# Patient Record
Sex: Male | Born: 1970 | Race: Black or African American | Hispanic: No | Marital: Single | Smoking: Current every day smoker
Health system: Southern US, Community
[De-identification: ages and names within clinical notes are randomized; demographics above are authoritative.]

## PROBLEM LIST (undated history)

## (undated) DIAGNOSIS — I63511 Cerebral infarction due to unspecified occlusion or stenosis of right middle cerebral artery: Secondary | ICD-10-CM

## (undated) DIAGNOSIS — M109 Gout, unspecified: Secondary | ICD-10-CM

## (undated) DIAGNOSIS — Z8739 Personal history of other diseases of the musculoskeletal system and connective tissue: Secondary | ICD-10-CM

## (undated) DIAGNOSIS — Z720 Tobacco use: Secondary | ICD-10-CM

## (undated) DIAGNOSIS — I639 Cerebral infarction, unspecified: Secondary | ICD-10-CM

## (undated) DIAGNOSIS — S0292XA Unspecified fracture of facial bones, initial encounter for closed fracture: Secondary | ICD-10-CM

## (undated) DIAGNOSIS — I10 Essential (primary) hypertension: Secondary | ICD-10-CM

## (undated) DIAGNOSIS — M1990 Unspecified osteoarthritis, unspecified site: Secondary | ICD-10-CM

## (undated) DIAGNOSIS — E7800 Pure hypercholesterolemia, unspecified: Secondary | ICD-10-CM

---
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
## 2002-07-30 ENCOUNTER — Encounter: Payer: Self-pay | Admitting: Emergency Medicine

## 2002-07-30 ENCOUNTER — Emergency Department (HOSPITAL_COMMUNITY): Admission: EM | Admit: 2002-07-30 | Discharge: 2002-07-30 | Payer: Self-pay | Admitting: Emergency Medicine

## 2002-10-31 ENCOUNTER — Emergency Department (HOSPITAL_COMMUNITY): Admission: EM | Admit: 2002-10-31 | Discharge: 2002-10-31 | Payer: Self-pay | Admitting: Emergency Medicine

## 2003-07-06 ENCOUNTER — Emergency Department (HOSPITAL_COMMUNITY): Admission: EM | Admit: 2003-07-06 | Discharge: 2003-07-06 | Payer: Self-pay | Admitting: Emergency Medicine

## 2008-03-27 ENCOUNTER — Emergency Department (HOSPITAL_COMMUNITY): Admission: EM | Admit: 2008-03-27 | Discharge: 2008-03-27 | Payer: Self-pay | Admitting: Family Medicine

## 2011-01-20 ENCOUNTER — Emergency Department (HOSPITAL_COMMUNITY): Payer: Self-pay

## 2011-01-20 ENCOUNTER — Emergency Department (HOSPITAL_COMMUNITY)
Admission: EM | Admit: 2011-01-20 | Discharge: 2011-01-20 | Disposition: A | Discharge status: Home / Self Care | Payer: Self-pay | Attending: Emergency Medicine | Admitting: Emergency Medicine

## 2011-01-20 DIAGNOSIS — R51 Headache: Secondary | ICD-10-CM | POA: Insufficient documentation

## 2011-01-20 DIAGNOSIS — S40019A Contusion of unspecified shoulder, initial encounter: Secondary | ICD-10-CM | POA: Insufficient documentation

## 2011-01-20 DIAGNOSIS — IMO0002 Reserved for concepts with insufficient information to code with codable children: Secondary | ICD-10-CM | POA: Insufficient documentation

## 2011-01-20 DIAGNOSIS — S0003XA Contusion of scalp, initial encounter: Secondary | ICD-10-CM | POA: Insufficient documentation

## 2011-01-20 DIAGNOSIS — Y92009 Unspecified place in unspecified non-institutional (private) residence as the place of occurrence of the external cause: Secondary | ICD-10-CM | POA: Insufficient documentation

## 2011-01-20 DIAGNOSIS — R609 Edema, unspecified: Secondary | ICD-10-CM | POA: Insufficient documentation

## 2011-01-20 DIAGNOSIS — S0100XA Unspecified open wound of scalp, initial encounter: Secondary | ICD-10-CM | POA: Insufficient documentation

## 2011-01-31 ENCOUNTER — Inpatient Hospital Stay (INDEPENDENT_AMBULATORY_CARE_PROVIDER_SITE_OTHER)
Admission: RE | Admit: 2011-01-31 | Discharge: 2011-01-31 | Disposition: A | Discharge status: Home / Self Care | Payer: Self-pay | Source: Ambulatory Visit | Attending: Family Medicine | Admitting: Family Medicine

## 2011-01-31 DIAGNOSIS — Z4802 Encounter for removal of sutures: Secondary | ICD-10-CM

## 2011-02-02 ENCOUNTER — Emergency Department (HOSPITAL_COMMUNITY)
Admission: EM | Admit: 2011-02-02 | Discharge: 2011-02-02 | Disposition: A | Discharge status: Home / Self Care | Payer: Self-pay | Attending: Emergency Medicine | Admitting: Emergency Medicine

## 2011-02-02 ENCOUNTER — Emergency Department (HOSPITAL_COMMUNITY): Payer: Self-pay

## 2011-02-02 DIAGNOSIS — M79609 Pain in unspecified limb: Secondary | ICD-10-CM | POA: Insufficient documentation

## 2011-02-02 DIAGNOSIS — Y990 Civilian activity done for income or pay: Secondary | ICD-10-CM | POA: Insufficient documentation

## 2011-02-02 DIAGNOSIS — M25539 Pain in unspecified wrist: Secondary | ICD-10-CM | POA: Insufficient documentation

## 2011-02-02 DIAGNOSIS — S5010XA Contusion of unspecified forearm, initial encounter: Secondary | ICD-10-CM | POA: Insufficient documentation

## 2011-02-02 DIAGNOSIS — W19XXXA Unspecified fall, initial encounter: Secondary | ICD-10-CM | POA: Insufficient documentation

## 2012-06-26 ENCOUNTER — Encounter (HOSPITAL_COMMUNITY): Payer: Self-pay | Admitting: *Deleted

## 2012-06-26 ENCOUNTER — Emergency Department (HOSPITAL_COMMUNITY)
Admission: EM | Admit: 2012-06-26 | Discharge: 2012-06-26 | Disposition: A | Discharge status: Home / Self Care | Payer: Self-pay | Attending: Emergency Medicine | Admitting: Emergency Medicine

## 2012-06-26 ENCOUNTER — Emergency Department (HOSPITAL_COMMUNITY): Payer: Self-pay

## 2012-06-26 DIAGNOSIS — R05 Cough: Secondary | ICD-10-CM | POA: Insufficient documentation

## 2012-06-26 DIAGNOSIS — R509 Fever, unspecified: Secondary | ICD-10-CM | POA: Insufficient documentation

## 2012-06-26 DIAGNOSIS — F17200 Nicotine dependence, unspecified, uncomplicated: Secondary | ICD-10-CM | POA: Insufficient documentation

## 2012-06-26 DIAGNOSIS — R0602 Shortness of breath: Secondary | ICD-10-CM | POA: Insufficient documentation

## 2012-06-26 DIAGNOSIS — R071 Chest pain on breathing: Secondary | ICD-10-CM | POA: Insufficient documentation

## 2012-06-26 DIAGNOSIS — J40 Bronchitis, not specified as acute or chronic: Secondary | ICD-10-CM | POA: Insufficient documentation

## 2012-06-26 DIAGNOSIS — R059 Cough, unspecified: Secondary | ICD-10-CM | POA: Insufficient documentation

## 2012-06-26 DIAGNOSIS — R0789 Other chest pain: Secondary | ICD-10-CM

## 2012-06-26 LAB — D-DIMER, QUANTITATIVE: D-Dimer, Quant: <0.27 ug/mL-FEU (ref 0.00–0.48)

## 2012-06-26 LAB — COMPREHENSIVE METABOLIC PANEL
ALT: 23 U/L (ref 0–53)
AST: 22 U/L (ref 0–37)
Albumin: 3.8 g/dL (ref 3.5–5.2)
Alkaline Phosphatase: 70 U/L (ref 39–117)
BUN: 9 mg/dL (ref 6–23)
CO2: 22 mEq/L (ref 19–32)
Calcium: 9.6 mg/dL (ref 8.4–10.5)
Chloride: 102 mEq/L (ref 96–112)
Creatinine, Ser: 0.94 mg/dL (ref 0.50–1.35)
GFR calc Af Amer: >90 mL/min (ref >90)
GFR calc non Af Amer: >90 mL/min (ref >90)
Glucose, Bld: 104 mg/dL — ABNORMAL HIGH (ref 70–99)
Potassium: 3.9 mEq/L (ref 3.5–5.1)
Sodium: 135 mEq/L (ref 135–145)
Total Bilirubin: 0.2 mg/dL — ABNORMAL LOW (ref 0.3–1.2)
Total Protein: 7.5 g/dL (ref 6.0–8.3)

## 2012-06-26 LAB — CBC WITH DIFFERENTIAL/PLATELET
Basophils Absolute: 0 10*3/uL (ref 0.0–0.1)
Basophils Relative: 0 % (ref 0–1)
Eosinophils Absolute: 0.1 10*3/uL (ref 0.0–0.7)
Eosinophils Relative: 2 % (ref 0–5)
HCT: 39.2 % (ref 39.0–52.0)
Hemoglobin: 14.2 g/dL (ref 13.0–17.0)
Lymphocytes Relative: 37 % (ref 12–46)
Lymphs Abs: 2 10*3/uL (ref 0.7–4.0)
MCH: 32.6 pg (ref 26.0–34.0)
MCHC: 36.2 g/dL — ABNORMAL HIGH (ref 30.0–36.0)
MCV: 89.9 fL (ref 78.0–100.0)
Monocytes Absolute: 0.4 10*3/uL (ref 0.1–1.0)
Monocytes Relative: 7 % (ref 3–12)
Neutro Abs: 3 10*3/uL (ref 1.7–7.7)
Neutrophils Relative %: 54 % (ref 43–77)
Platelets: 259 10*3/uL (ref 150–400)
RBC: 4.36 MIL/uL (ref 4.22–5.81)
RDW: 13.6 % (ref 11.5–15.5)
WBC: 5.5 10*3/uL (ref 4.0–10.5)

## 2012-06-26 LAB — POCT I-STAT TROPONIN I: Troponin i, poc: 0 ng/mL (ref 0.00–0.08)

## 2012-06-26 MED ORDER — IBUPROFEN 600 MG PO TABS: 600.0000 mg | ORAL_TABLET | Freq: Three times a day (TID) | ORAL | Status: DC

## 2012-06-26 MED ORDER — TRAMADOL HCL 50 MG PO TABS: 50.0000 mg | ORAL_TABLET | Freq: Four times a day (QID) | ORAL | Status: DC | PRN

## 2012-06-26 MED ORDER — AZITHROMYCIN 250 MG PO TABS: 250.0000 mg | ORAL_TABLET | Freq: Every day | ORAL | Status: DC

## 2012-06-26 MED ORDER — SODIUM CHLORIDE 0.9 % IV BOLUS (SEPSIS)
1000.0000 mL | Freq: Once | INTRAVENOUS | Status: AC
  Administered 2012-06-26: 1000 mL via INTRAVENOUS

## 2012-06-26 MED ORDER — BENZONATATE 100 MG PO CAPS: 100.0000 mg | ORAL_CAPSULE | Freq: Three times a day (TID) | ORAL | Status: DC

## 2012-06-26 MED ORDER — KETOROLAC TROMETHAMINE 30 MG/ML IJ SOLN
30.0000 mg | Freq: Once | INTRAMUSCULAR | Status: AC
  Administered 2012-06-26: 30 mg via INTRAVENOUS
  Filled 2012-06-26: qty 1

## 2012-06-26 NOTE — ED Notes (Signed)
C/o constant non-stop right side CP non radiating with occasional SOB usually with coughing spells x 1 month. Increased pain with palpation, deep breaths, certain mvmts & coughing. Denies diaphoresis, n/v.  C/o prod cough x 2 months, reports had a fever 2 weeks ago, none since then.  Presently resp e/u & shallow, denies SOB.

## 2012-06-26 NOTE — ED Notes (Signed)
Patient transported to X-ray 

## 2012-06-26 NOTE — ED Notes (Signed)
C/o constant right side CP x 1 month. +SOB, denies n/v, diaphoresis. Productive cough x 2 months

## 2012-06-26 NOTE — ED Provider Notes (Signed)
History     CSN: [id number]  Arrival date & time 06/26/12  1004   First MD Initiated Contact with Patient 06/26/12 1007      Chief Complaint  Patient presents with  . Chest Pain    (Consider location/radiation/quality/duration/timing/severity/associated sxs/prior treatment) HPI Pt p/w R anterior chest pain x 1 month. Pain is constant though worse with deep inspiration and cough. Has had productive cough for the past month or 2 with episodic subjective fevers. No recent travel or surgeries. + 20 year smoking history.   History reviewed. No pertinent past medical history.  History reviewed. No pertinent past surgical history.  History reviewed. No pertinent family history.  History  Substance Use Topics  . Smoking status: Current Every Day Smoker -- 2.0 packs/day for 27 years    Types: Cigarettes  . Smokeless tobacco: Not on file  . Alcohol Use: Yes     Comment: 128 oz beer daily      Review of Systems  Constitutional: Positive for fever. Negative for chills.  HENT: Negative for neck pain.   Respiratory: Positive for cough and shortness of breath. Negative for chest tightness and wheezing.   Cardiovascular: Positive for chest pain. Negative for palpitations and leg swelling.  Gastrointestinal: Negative for nausea, vomiting, abdominal pain and diarrhea.  Musculoskeletal: Negative for myalgias, back pain and joint swelling.  Skin: Negative for rash and wound.  Neurological: Negative for dizziness, weakness, light-headedness, numbness and headaches.    Allergies  Review of patient's allergies indicates no known allergies.  Home Medications   Current Outpatient Rx  Name  Route  Sig  Dispense  Refill  . AZITHROMYCIN 250 MG PO TABS   Oral   Take 1 tablet (250 mg total) by mouth daily. Take first 2 tablets together, then 1 every day until finished.   6 tablet   0   . BENZONATATE 100 MG PO CAPS   Oral   Take 1 capsule (100 mg total) by mouth every 8 (eight) hours.  21 capsule   0   . IBUPROFEN 600 MG PO TABS   Oral   Take 1 tablet (600 mg total) by mouth 3 (three) times daily.   30 tablet   0   . TRAMADOL HCL 50 MG PO TABS   Oral   Take 1 tablet (50 mg total) by mouth every 6 (six) hours as needed for pain.   15 tablet   0     BP 138/91  Pulse 79  Temp 98.1 F (36.7 C) (Oral)  Resp 14  Ht 5\' 8"  (1.727 m)  Wt 185 lb (83.915 kg)  BMI 28.13 kg/m2  SpO2 99%  Physical Exam  Nursing note and vitals reviewed. Constitutional: He is oriented to person, place, and time. He appears well-developed and well-nourished. No distress.  HENT:  Head: Normocephalic and atraumatic.  Mouth/Throat: Oropharynx is clear and moist.  Eyes: EOM are normal. Pupils are equal, round, and reactive to light.  Neck: Normal range of motion. Neck supple.  Cardiovascular: Normal rate and regular rhythm.  Exam reveals no gallop and no friction rub.   No murmur heard. Pulmonary/Chest: Effort normal and breath sounds normal. No respiratory distress. He has no wheezes. He has no rales. He exhibits tenderness (Reproduced R chest wall TTP. ).  Abdominal: Soft. Bowel sounds are normal. He exhibits no distension and no mass. There is no tenderness. There is no rebound and no guarding.  Musculoskeletal: Normal range of motion. He exhibits no edema  and no tenderness.       No calf swelling or pain  Neurological: He is alert and oriented to person, place, and time.  Skin: Skin is warm and dry. No rash noted. No erythema.  Psychiatric: He has a normal mood and affect. His behavior is normal.    ED Course  Procedures (including critical care time)  Labs Reviewed  CBC WITH DIFFERENTIAL - Abnormal; Notable for the following:    MCHC 36.2 (*)     All other components within normal limits  COMPREHENSIVE METABOLIC PANEL - Abnormal; Notable for the following:    Glucose, Bld 104 (*)     Total Bilirubin 0.2 (*)     All other components within normal limits  D-DIMER,  QUANTITATIVE  POCT I-STAT TROPONIN I   Dg Chest 2 View  06/26/2012  *RADIOLOGY REPORT*  Clinical Data: Chest pain and cough.  Shortness of breath.  Smoker.  CHEST - 2 VIEW  Comparison: None.  Findings: Midline trachea.  Normal heart size and mediastinal contours. No pleural effusion or pneumothorax.  Clear lungs.  IMPRESSION: No acute cardiopulmonary disease.   Original Report Authenticated By: Jeronimo Greaves, M.D.      1. Chest wall pain   2. Bronchitis       Date: 06/26/2012  Rate: 92  Rhythm: normal sinus rhythm  QRS Axis: normal  Intervals: normal  ST/T Wave abnormalities: nonspecific T wave changes  Conduction Disutrbances:none  Narrative Interpretation:   Old EKG Reviewed: none available   MDM  Pt feeling better after meds. Pain is likely pleurisy or musculoskeletal pain due to bronchitis. Encouraged to return for worsening pain or concerns        Loren Racer, MD 06/26/12 1245

## 2012-09-23 DIAGNOSIS — I639 Cerebral infarction, unspecified: Secondary | ICD-10-CM

## 2012-09-23 HISTORY — DX: Cerebral infarction, unspecified: I63.9

## 2012-10-05 ENCOUNTER — Emergency Department (HOSPITAL_COMMUNITY): Payer: Self-pay

## 2012-10-05 ENCOUNTER — Inpatient Hospital Stay (HOSPITAL_COMMUNITY): Payer: Self-pay

## 2012-10-05 ENCOUNTER — Encounter (HOSPITAL_COMMUNITY): Payer: Self-pay | Admitting: Emergency Medicine

## 2012-10-05 ENCOUNTER — Inpatient Hospital Stay (HOSPITAL_COMMUNITY)
Admission: EM | Admit: 2012-10-05 | Discharge: 2012-10-09 | DRG: 066 | Disposition: A | Discharge status: Home Health | Payer: MEDICAID | Attending: Internal Medicine | Admitting: Internal Medicine

## 2012-10-05 DIAGNOSIS — F1410 Cocaine abuse, uncomplicated: Secondary | ICD-10-CM | POA: Diagnosis present

## 2012-10-05 DIAGNOSIS — I6350 Cerebral infarction due to unspecified occlusion or stenosis of unspecified cerebral artery: Principal | ICD-10-CM | POA: Diagnosis present

## 2012-10-05 DIAGNOSIS — R531 Weakness: Secondary | ICD-10-CM | POA: Diagnosis present

## 2012-10-05 DIAGNOSIS — F1491 Cocaine use, unspecified, in remission: Secondary | ICD-10-CM | POA: Diagnosis present

## 2012-10-05 DIAGNOSIS — F1210 Cannabis abuse, uncomplicated: Secondary | ICD-10-CM | POA: Diagnosis present

## 2012-10-05 DIAGNOSIS — F17200 Nicotine dependence, unspecified, uncomplicated: Secondary | ICD-10-CM | POA: Diagnosis present

## 2012-10-05 DIAGNOSIS — Z87898 Personal history of other specified conditions: Secondary | ICD-10-CM | POA: Diagnosis present

## 2012-10-05 DIAGNOSIS — E785 Hyperlipidemia, unspecified: Secondary | ICD-10-CM

## 2012-10-05 DIAGNOSIS — F1490 Cocaine use, unspecified, uncomplicated: Secondary | ICD-10-CM

## 2012-10-05 DIAGNOSIS — Z720 Tobacco use: Secondary | ICD-10-CM

## 2012-10-05 DIAGNOSIS — I639 Cerebral infarction, unspecified: Secondary | ICD-10-CM

## 2012-10-05 LAB — COMPREHENSIVE METABOLIC PANEL
ALT: 17 U/L (ref 0–53)
AST: 21 U/L (ref 0–37)
Albumin: 4.2 g/dL (ref 3.5–5.2)
Alkaline Phosphatase: 59 U/L (ref 39–117)
BUN: 14 mg/dL (ref 6–23)
CO2: 21 mEq/L (ref 19–32)
Calcium: 10.2 mg/dL (ref 8.4–10.5)
Chloride: 101 mEq/L (ref 96–112)
Creatinine, Ser: 1.12 mg/dL (ref 0.50–1.35)
GFR calc Af Amer: >90 mL/min (ref >90)
GFR calc non Af Amer: 79 mL/min — ABNORMAL LOW (ref >90)
Glucose, Bld: 116 mg/dL — ABNORMAL HIGH (ref 70–99)
Potassium: 4 mEq/L (ref 3.5–5.1)
Sodium: 137 mEq/L (ref 135–145)
Total Bilirubin: 0.2 mg/dL — ABNORMAL LOW (ref 0.3–1.2)
Total Protein: 8.6 g/dL — ABNORMAL HIGH (ref 6.0–8.3)

## 2012-10-05 LAB — RAPID URINE DRUG SCREEN, HOSP PERFORMED
Amphetamines: NOT DETECTED
Barbiturates: NOT DETECTED
Benzodiazepines: NOT DETECTED
Cocaine: POSITIVE — AB
Opiates: NOT DETECTED
Tetrahydrocannabinol: POSITIVE — AB

## 2012-10-05 LAB — CBC WITH DIFFERENTIAL/PLATELET
Basophils Absolute: 0 10*3/uL (ref 0.0–0.1)
Basophils Relative: 0 % (ref 0–1)
Eosinophils Absolute: 0.1 10*3/uL (ref 0.0–0.7)
Eosinophils Relative: 2 % (ref 0–5)
HCT: 43.2 % (ref 39.0–52.0)
Hemoglobin: 16 g/dL (ref 13.0–17.0)
Lymphocytes Relative: 40 % (ref 12–46)
Lymphs Abs: 2.3 10*3/uL (ref 0.7–4.0)
MCH: 32.7 pg (ref 26.0–34.0)
MCHC: 37 g/dL — ABNORMAL HIGH (ref 30.0–36.0)
MCV: 88.2 fL (ref 78.0–100.0)
Monocytes Absolute: 0.4 10*3/uL (ref 0.1–1.0)
Monocytes Relative: 6 % (ref 3–12)
Neutro Abs: 3 10*3/uL (ref 1.7–7.7)
Neutrophils Relative %: 51 % (ref 43–77)
Platelets: 277 10*3/uL (ref 150–400)
RBC: 4.9 MIL/uL (ref 4.22–5.81)
RDW: 13 % (ref 11.5–15.5)
WBC: 5.8 10*3/uL (ref 4.0–10.5)

## 2012-10-05 LAB — ETHANOL: Alcohol, Ethyl (B): <11 mg/dL (ref 0–11)

## 2012-10-05 MED ORDER — SODIUM CHLORIDE 0.9 % IV BOLUS (SEPSIS)
1000.0000 mL | Freq: Once | INTRAVENOUS | Status: AC
  Administered 2012-10-05: 1000 mL via INTRAVENOUS

## 2012-10-05 MED ORDER — ASPIRIN 81 MG PO CHEW
324.0000 mg | CHEWABLE_TABLET | Freq: Every day | ORAL | Status: DC
  Administered 2012-10-05 – 2012-10-09 (×5): 324 mg via ORAL
  Filled 2012-10-05 (×5): qty 4

## 2012-10-05 NOTE — ED Notes (Signed)
approx 2 days friend has noticed diffrence in pt and weakness not acting normal and possible stroke pt does have slurred speech, unsteady gait, sx began 2 days ago,

## 2012-10-05 NOTE — H&P (Addendum)
[facility] History and Physical  Manuel Harrison:[id number] DOB: [birth year] DOA: 10/05/2012  Referring physician: ER physician PCP: No primary provider on file.   Chief Complaint: right sided weakness  HPI:  42 year old male with no significant past medical history who has presented with right sided weakness involving upper and lower extremity associated with right sided facial droop and slurred speech for about 1 week prior to this admission. Patient reported right foot drop and difficulty ambulating. He is somewhat confused, he knows his name but does not know he is in hospital or the date  at the time of my interview with him. No reports of chest pain, shortness of breath, palpitations. No abdominal pain, nausea or vomiting. No lightheadedness or loss of consciousness. No fever or chills. No reports of blood in stool or urine.  In ED, evaluation included CT head which was significant for acute infarct in left internal capsule and deep white matter. CBC and BMET was essentially unremarkable.  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   Stroke order set in place. Follow up 2 D ECHO, carotid doppler. Lipid panel.  Follow up PT/OT and SLP evaluation. If SLP normal then may start aspirin.  UDS positive for cocaine Active Problems:   Stroke  Management as above  Code Status: Full Family Communication: Pt at bedside Disposition Plan: Admit for further evaluation  Manson Passey, MD  [facility] Pager [phone]  If 7PM-7AM, please contact night-coverage [email or web address] Password [email or web address] 10/05/2012, 7:01 PM  Review of Systems:  Constitutional: Negative for fever, chills and malaise/fatigue. Negative for diaphoresis.  HENT: Negative for hearing loss, ear pain, nosebleeds, congestion, sore throat, neck pain, tinnitus and ear discharge.   Eyes:  Negative for blurred vision, double vision, photophobia, pain, discharge and redness.  Respiratory: Negative for cough, hemoptysis, sputum production, shortness of breath, wheezing and stridor.   Cardiovascular: Negative for chest pain, palpitations, orthopnea, claudication and leg swelling.  Gastrointestinal: Negative for nausea, vomiting and abdominal pain. Negative for heartburn, constipation, blood in stool and melena.  Genitourinary: Negative for dysuria, urgency, frequency, hematuria and flank pain.  Musculoskeletal: Negative for myalgias, back pain, joint pain and falls.  Skin: Negative for itching and rash.  Neurological: positive for right sided weakness; right facial droop Endo/Heme/Allergies: Negative for environmental allergies and polydipsia. Does not bruise/bleed easily.  Psychiatric/Behavioral: Negative for suicidal ideas. The patient is not nervous/anxious.      History reviewed. No pertinent past medical history. No past surgical history on file. Social History:  reports that he has been smoking Cigarettes.  He has a 54 pack-year smoking history. He does not have any smokeless tobacco history on file. He reports that  drinks alcohol. He reports that he uses illicit drugs (Marijuana).  No Known Allergies  Family History: htn in mother   Prior to Admission medications   Not on File   Physical Exam: Filed Vitals:   10/05/12 1732  BP: 140/93  Pulse: 88  Temp: 98.3 F (36.8 C)  TempSrc: Oral  SpO2: 100%    Physical Exam  Constitutional: Appears well-developed and well-nourished. No distress.  HENT: Normocephalic. External right and left ear normal. Oropharynx is clear and moist.  Eyes: Conjunctivae and EOM are normal. PERRLA, no scleral icterus.  Neck: Normal ROM. Neck supple. No JVD. No tracheal deviation. No thyromegaly.  CVS: RRR, S1/S2 +, no  murmurs, no gallops, no carotid bruit.  Pulmonary: Effort and breath sounds normal, no stridor, rhonchi, wheezes, rales.   Abdominal: Soft. BS +,  no distension, tenderness, rebound or guarding.  Musculoskeletal: Normal range of motion. No edema and no tenderness.  Lymphadenopathy: No lymphadenopathy noted, cervical, inguinal. Neuro: Alert but oriented to person (self) only. Normal reflexes, right sided weakness; right facial droop Skin: Skin is warm and dry. No rash noted. Not diaphoretic. No erythema. No pallor.  Psychiatric: Normal mood and affect. Behavior, judgment, thought content normal.   Labs on Admission:  Basic Metabolic Panel: No results found for this basename: NA, K, CL, CO2, GLUCOSE, BUN, CREATININE, CALCIUM, MG, PHOS,  in the last 168 hours Liver Function Tests: No results found for this basename: AST, ALT, ALKPHOS, BILITOT, PROT, ALBUMIN,  in the last 168 hours No results found for this basename: LIPASE, AMYLASE,  in the last 168 hours No results found for this basename: AMMONIA,  in the last 168 hours CBC:  Recent Labs Lab 10/05/12 1819  WBC 5.8  NEUTROABS 3.0  HGB 16.0  HCT 43.2  MCV 88.2  PLT 277   Cardiac Enzymes: No results found for this basename: CKTOTAL, CKMB, CKMBINDEX, TROPONINI,  in the last 168 hours BNP: No components found with this basename: POCBNP,  CBG: No results found for this basename: GLUCAP,  in the last 168 hours  Radiological Exams on Admission: Ct Head Wo Contrast 10/05/2012  * IMPRESSION: Hypodensity left internal capsule and deep white matter on the left, consistent with acute infarct.  MRI and MRA of the head is suggested for further evaluation.   Original Report Authenticated By: Janeece Riggers, M.D.     EKG: Normal sinus rhythm, no ST/T wave changes  Time spent: 75 minutes

## 2012-10-05 NOTE — ED Provider Notes (Addendum)
History     CSN: [id number]  Arrival date & time 10/05/12  1717   First MD Initiated Contact with Patient 10/05/12 1736    poor history due to speech difficulty  Chief Complaint  Patient presents with  . Aphasia  . Weakness  . Gait Problem  . Facial Droop    (Consider location/radiation/quality/duration/timing/severity/associated sxs/prior treatment) HPI Patient with onset of weakness on right side and difficulty speaking for a week. History is obtained from his girlfriend who states that he had refused to come to the hospital until today. He has been walking with difficulty. He has difficulty using his right hand. She states that the onset was fairly sudden but is unable to pinpoint for me and time when it occurred. He has no previous history of this. He does smoke does not have any other known medical history. History reviewed. No pertinent past medical history.  No past surgical history on file.  No family history on file.  History  Substance Use Topics  . Smoking status: Current Every Day Smoker -- 2.00 packs/day for 27 years    Types: Cigarettes  . Smokeless tobacco: Not on file  . Alcohol Use: Yes     Comment: 128 oz beer daily      Review of Systems  Unable to perform ROS   Allergies  Review of patient's allergies indicates no known allergies.  Home Medications  No current outpatient prescriptions on file.  BP 140/93  Pulse 88  Temp(Src) 98.3 F (36.8 C) (Oral)  SpO2 100%  Physical Exam  Nursing note and vitals reviewed. Constitutional: He appears well-developed and well-nourished.  HENT:  Head: Normocephalic and atraumatic.  Right Ear: External ear normal.  Left Ear: External ear normal.  Mouth/Throat: Oropharynx is clear and moist.  Eyes: Conjunctivae and EOM are normal. Pupils are equal, round, and reactive to light.  Neck: Normal range of motion. Neck supple.  Cardiovascular: Normal rate, regular rhythm, normal heart sounds and intact distal  pulses.   Pulmonary/Chest: Effort normal and breath sounds normal.  Abdominal: Soft. Bowel sounds are normal.  Neurological:  Rue strenght 4/5, rle 4/5 lue 5/5, lle 5/5 Right facial decreased movment Speech garbled    ED Course  Procedures (including critical care time)  Labs Reviewed  CBC WITH DIFFERENTIAL  COMPREHENSIVE METABOLIC PANEL  ETHANOL  URINE RAPID DRUG SCREEN (HOSP PERFORMED)   No results found.   No diagnosis found.  Results for orders placed during the hospital encounter of 06/26/12  CBC WITH DIFFERENTIAL      Result Value Range   WBC 5.5  4.0 - 10.5 K/uL   RBC 4.36  4.22 - 5.81 MIL/uL   Hemoglobin 14.2  13.0 - 17.0 g/dL   HCT 40.9  81.1 - 91.4 %   MCV 89.9  78.0 - 100.0 fL   MCH 32.6  26.0 - 34.0 pg   MCHC 36.2 (*) 30.0 - 36.0 g/dL   RDW 78.2  95.6 - 21.3 %   Platelets 259  150 - 400 K/uL   Neutrophils Relative 54  43 - 77 %   Neutro Abs 3.0  1.7 - 7.7 K/uL   Lymphocytes Relative 37  12 - 46 %   Lymphs Abs 2.0  0.7 - 4.0 K/uL   Monocytes Relative 7  3 - 12 %   Monocytes Absolute 0.4  0.1 - 1.0 K/uL   Eosinophils Relative 2  0 - 5 %   Eosinophils Absolute 0.1  0.0 - 0.7  K/uL   Basophils Relative 0  0 - 1 %   Basophils Absolute 0.0  0.0 - 0.1 K/uL  COMPREHENSIVE METABOLIC PANEL      Result Value Range   Sodium 135  135 - 145 mEq/L   Potassium 3.9  3.5 - 5.1 mEq/L   Chloride 102  96 - 112 mEq/L   CO2 22  19 - 32 mEq/L   Glucose, Bld 104 (*) 70 - 99 mg/dL   BUN 9  6 - 23 mg/dL   Creatinine, Ser 1.61  0.50 - 1.35 mg/dL   Calcium 9.6  8.4 - 09.6 mg/dL   Total Protein 7.5  6.0 - 8.3 g/dL   Albumin 3.8  3.5 - 5.2 g/dL   AST 22  0 - 37 U/L   ALT 23  0 - 53 U/L   Alkaline Phosphatase 70  39 - 117 U/L   Total Bilirubin 0.2 (*) 0.3 - 1.2 mg/dL   GFR calc non Af Amer >90  >90 mL/min   GFR calc Af Amer >90  >90 mL/min  D-DIMER, QUANTITATIVE      Result Value Range   D-Dimer, Quant <0.27  0.00 - 0.48 ug/mL-FEU  POCT I-STAT TROPONIN I      Result  Value Range   Troponin i, poc 0.00  0.00 - 0.08 ng/mL   Comment 3             Ct Head Wo Contrast  10/05/2012  *RADIOLOGY REPORT*  Clinical Data: Ataxia,  aphasia, weakness  CT HEAD WITHOUT CONTRAST  Technique:  Contiguous axial images were obtained from the base of the skull through the vertex without contrast.  Comparison: T 01/20/2011  Findings: Ill-defined hypodensity in the left internal capsule and deep white matter, not present on the prior CT.  This is most likely due to acute infarct.  Negative for intracranial hemorrhage.  No mass lesion is present. No midline shift.  Ventricle size is normal.  IMPRESSION: Hypodensity left internal capsule and deep white matter on the left, consistent with acute infarct.  MRI and MRA of the head is suggested for further evaluation.   Original Report Authenticated By: Janeece Riggers, M.D.      Date: 10/05/2012  Rate: 79  Rhythm: normal sinus rhythm with sinus arrhythmia  QRS Axis: normal  Intervals: normal  ST/T Wave abnormalities: nonspecific st changes in inferior leads  Conduction Disutrbances: none  Narrative Interpretation: unremarkable  Compared to ekg of 06/26/12 the rate has decreased and nsst changes are unchanged   MDM  42 year old male with no known medical history who presents today with one-week history of right sided weakness and difficulty speaking. CT shows a hypodensity of the left internal capsule consistent with acute infarct. Patient is not a candidate for TPA do to duration of the symptoms. He is having difficulty at home performing activities of daily living. Hospitalists are being consulted to admit the patient for further workup and physical therapy and neurology consult.     The patient's care discussed with Dr. Elisabeth Pigeon and patient is to be admitted to a telemetry team here at [facility] long on team heat.  Hilario Quarry, MD 10/05/12 0454    Hilario Quarry, MD 10/10/12 [phone]

## 2012-10-06 ENCOUNTER — Inpatient Hospital Stay (HOSPITAL_COMMUNITY): Payer: Self-pay

## 2012-10-06 ENCOUNTER — Encounter (HOSPITAL_COMMUNITY): Payer: Self-pay | Admitting: *Deleted

## 2012-10-06 DIAGNOSIS — I517 Cardiomegaly: Secondary | ICD-10-CM

## 2012-10-06 DIAGNOSIS — Z720 Tobacco use: Secondary | ICD-10-CM | POA: Diagnosis present

## 2012-10-06 DIAGNOSIS — F1491 Cocaine use, unspecified, in remission: Secondary | ICD-10-CM | POA: Diagnosis present

## 2012-10-06 DIAGNOSIS — I6350 Cerebral infarction due to unspecified occlusion or stenosis of unspecified cerebral artery: Principal | ICD-10-CM

## 2012-10-06 DIAGNOSIS — Z87898 Personal history of other specified conditions: Secondary | ICD-10-CM | POA: Diagnosis present

## 2012-10-06 DIAGNOSIS — M6281 Muscle weakness (generalized): Secondary | ICD-10-CM

## 2012-10-06 DIAGNOSIS — E785 Hyperlipidemia, unspecified: Secondary | ICD-10-CM | POA: Diagnosis present

## 2012-10-06 DIAGNOSIS — F17200 Nicotine dependence, unspecified, uncomplicated: Secondary | ICD-10-CM

## 2012-10-06 LAB — LIPID PANEL
Cholesterol: 200 mg/dL (ref 0–200)
HDL: 27 mg/dL — ABNORMAL LOW (ref >39)
LDL Cholesterol: 128 mg/dL — ABNORMAL HIGH (ref 0–99)
Total CHOL/HDL Ratio: 7.4 RATIO
Triglycerides: 227 mg/dL — ABNORMAL HIGH (ref <150)
VLDL: 45 mg/dL — ABNORMAL HIGH (ref 0–40)

## 2012-10-06 LAB — HEMOGLOBIN A1C
Hgb A1c MFr Bld: 5.5 % (ref <5.7)
Mean Plasma Glucose: 111 mg/dL (ref <117)

## 2012-10-06 LAB — ANTITHROMBIN III: AntiThromb III Func: 110 % (ref 75–120)

## 2012-10-06 MED ORDER — SIMVASTATIN 20 MG PO TABS
20.0000 mg | ORAL_TABLET | Freq: Every day | ORAL | Status: DC
  Administered 2012-10-06 – 2012-10-08 (×3): 20 mg via ORAL
  Filled 2012-10-06 (×4): qty 1

## 2012-10-06 MED ORDER — INFLUENZA VIRUS VACC SPLIT PF IM SUSP
0.5000 mL | INTRAMUSCULAR | Status: AC
  Administered 2012-10-06: 0.5 mL via INTRAMUSCULAR
  Filled 2012-10-06: qty 0.5

## 2012-10-06 MED ORDER — ENOXAPARIN SODIUM 40 MG/0.4ML ~~LOC~~ SOLN
40.0000 mg | SUBCUTANEOUS | Status: DC
  Administered 2012-10-06 – 2012-10-09 (×4): 40 mg via SUBCUTANEOUS
  Filled 2012-10-06 (×4): qty 0.4

## 2012-10-06 MED ORDER — PNEUMOCOCCAL VAC POLYVALENT 25 MCG/0.5ML IJ INJ
0.5000 mL | INJECTION | INTRAMUSCULAR | Status: AC
  Filled 2012-10-06 (×2): qty 0.5

## 2012-10-06 MED ORDER — NICOTINE 21 MG/24HR TD PT24
21.0000 mg | MEDICATED_PATCH | Freq: Every day | TRANSDERMAL | Status: DC
  Administered 2012-10-06 – 2012-10-09 (×4): 21 mg via TRANSDERMAL
  Filled 2012-10-06 (×4): qty 1

## 2012-10-06 NOTE — Progress Notes (Signed)
VASCULAR LAB PRELIMINARY  PRELIMINARY  PRELIMINARY  PRELIMINARY  Carotid duplex  completed.    Preliminary report:  Bilateral:  No evidence of hemodynamically significant internal carotid artery stenosis.   Vertebral artery flow is antegrade.      Remmy Riffe, RVT 10/06/2012, 9:30 AM

## 2012-10-06 NOTE — Progress Notes (Signed)
Rehab Admissions Coordinator Note:  Patient was screened by Manuel Harrison for appropriateness for an Inpatient Acute Rehab Consult.  At this time, we are recommending PT eval and further progress with therapy before determining if inpt rehab needed or home health most appropriate. I will follow up on Monday.  Manuel Harrison 10/06/2012, 4:17 PM  I can be reached at [phone].

## 2012-10-06 NOTE — Evaluation (Signed)
Occupational Therapy Evaluation Patient Details Name: Manuel Harrison MRN: [id number] DOB: 05/25/1971 Today's Date: 10/06/2012 Time: 5621-3086 OT Time Calculation (min): 19 min  OT Assessment / Plan / Recommendation Clinical Impression  This 42 year old man was admitted with L internal capsule CVA.  He is appropriate for skilled OT to increase safety and independence with adls and to strengthen RUE to use as dominant hand.    OT Assessment  Patient needs continued OT Services    Follow Up Recommendations  CIR vs. Home with 24/7    Barriers to Discharge      Equipment Recommendations   (to be further assessed)    Recommendations for Other Services    Frequency  Min 3X/week    Precautions / Restrictions Precautions Precautions: Fall Restrictions Weight Bearing Restrictions: No   Pertinent Vitals/Pain No pain    ADL  Grooming: Set up;Wash/dry hands;Wash/dry face Where Assessed - Grooming: Supported sitting Upper Body Bathing: Set up Where Assessed - Upper Body Bathing: Supported sitting Lower Body Bathing: Minimal assistance Where Assessed - Lower Body Bathing: Supported sit to stand Upper Body Dressing: Set up Where Assessed - Upper Body Dressing: Unsupported sitting Lower Body Dressing: Minimal assistance Where Assessed - Lower Body Dressing: Supported sit to Pharmacist, hospital: Minimal assistance Statistician Method: Sit to Barista:  (bed, ambulated, bed) Toileting - Clothing Manipulation and Hygiene: Minimal assistance Where Assessed - Glass blower/designer Manipulation and Hygiene: Sit to stand from 3-in-1 or toilet Equipment Used: Gait belt;Rolling walker Transfers/Ambulation Related to ADLs: min A ambulating with RW.  Pt initially ran into things on R (twice) but then self corrected. Pt with weakness in RLE --crossed leg towards midline and slight limp noted  ADL Comments: Able to use bil UEs, Right is weaker, and he used L as primary  opening containers    OT Diagnosis: Hemiplegia dominant side  OT Problem List: Decreased strength;Decreased activity tolerance;Impaired balance (sitting and/or standing) OT Treatment Interventions: Self-care/ADL training;Balance training;Patient/family education;Therapeutic activities;Therapeutic exercise;DME and/or AE instruction   OT Goals Acute Rehab OT Goals OT Goal Formulation: With patient Time For Goal Achievement: 10/20/12 Potential to Achieve Goals: Good ADL Goals Pt Will Transfer to Toilet: with supervision;Ambulation;Regular height toilet (vs: 3:1 over commode) ADL Goal: Toilet Transfer - Progress: Goal set today Miscellaneous OT Goals Miscellaneous OT Goal #1: Pt will gather clothes with RW and min guard then perform adl with supervision, sit to stand OT Goal: Miscellaneous Goal #1 - Progress: Goal set today Miscellaneous OT Goal #2: Pt will be independent with HEP (AROM) for R UE strengthening and coordination (using RUE as dominant hand after education) OT Goal: Miscellaneous Goal #2 - Progress: Goal set today Miscellaneous OT Goal #3: Pt will transfer to tub or shower with min A and appropriate DME OT Goal: Miscellaneous Goal #3 - Progress: Goal set today  Visit Information  Last OT Received On: 10/06/12 Assistance Needed: +1 PT/OT Co-Evaluation/Treatment: Yes    Subjective Data  Subjective: Yes (asked if someone could stay with him all the time at home, if needed) Patient Stated Goal: Agreeable to OT/PT.  None stated   Prior Functioning     Home Living Lives With: Alone (per chart) Prior Function Level of Independence: Independent Communication Communication:  (pt has very short answers--uh huh/ uh used a lot  ? aphasic ) Dominant Hand: Right         Vision/Perception Vision - History Patient Visual Report: No change from baseline (tracking; confrontation wfls)  Cognition  Cognition Overall Cognitive Status: Appears within functional limits for  tasks assessed/performed Arousal/Alertness: Awake/alert Orientation Level: Appears intact for tasks assessed (knew hospital but not [facility]) Behavior During Session: [facility] for tasks performed    Extremity/Trunk Assessment Right Upper Extremity Assessment RUE ROM/Strength/Tone: Deficits RUE ROM/Strength/Tone Deficits: has isolated movement but shoulder 3-/5 strength.  Able to oppose fingers to thumb--used as an assist when opening lotion RUE Sensation:  (reports wfls) RUE Coordination:  (uses as assist) Left Upper Extremity Assessment LUE ROM/Strength/Tone: Within functional levels     Mobility Bed Mobility Bed Mobility: Sit to Supine Sit to Supine: 5: Supervision;HOB flat Transfers Transfers: Sit to Stand Sit to Stand: 4: Min assist Details for Transfer Assistance: cues for hand placement     Exercise     Balance     End of Session OT - End of Session Patient left: in chair;with call bell/phone within reach Nurse Communication: Mobility status  GO     Alley Neils 10/06/2012, 2:28 PM Marica Otter, OTR/L [phone] 10/06/2012

## 2012-10-06 NOTE — Progress Notes (Signed)
During admission history questions pt is cooperative, however withdrawn irritable, answers all questions with yes or no. For instance pt states that he does get help at home. When asked with what, he answered "everything.".  Truth and reliability in admission history is questionable.

## 2012-10-06 NOTE — Progress Notes (Signed)
Echocardiogram 2D Echocardiogram has been performed.  Demarqus Jocson 10/06/2012, 10:26 AM

## 2012-10-06 NOTE — Progress Notes (Signed)
[facility] PROGRESS NOTE  EROS MONTOUR ZOX:[id number] DOB: 1971/03/03 DOA: 10/05/2012 PCP: No primary provider on file.  Brief narrative 42 year old male with history off heavy smoking and cocaine use presenting with acute onset of  right upper extremity weakness and left facial droop with CT head showing acute left internal capsule infarct  Assessment/Plan: Acute left sided CVA As evident on head CT . Brain showing acute infarct involving left lentiform nucleus and corona radiate along with infarct over posterior left insular cortex and subcortical infarcts. Possible etiologies include heavy smoking, cocaine use and hyperlipidemia. - still has left facial droop and right upper extremity weakness. -Continue those aspirin. Elevated LDL and triglycerides noted started on Zocor 20 mg daily -PT/OT eval. -Carotid Doppler and 2-D echo ordered. Check hemoglobin A1c -Continue neurochecks -Neurology consulted and will evaluate patient -Allow permissive hypertension. -Counseled strongly on suggestion of smoking and substance abuse   Code Status: Full code Family Communication: None at bedside Disposition Plan: Pending PT eval   Consultants:  Neurology consulted  Procedures:  None  Antibiotics:  None  HPI/Subjective: Admission H&P reviewed. Patient has left facial to and right hand weakness.  Objective: Filed Vitals:   10/06/12 0238 10/06/12 0438 10/06/12 0639 10/06/12 1031  BP: 141/85 116/82 126/84 123/78  Pulse: 90 74 90 64  Temp: 97.5 F (36.4 C) 97.9 F (36.6 C) 97.9 F (36.6 C) 98.1 F (36.7 C)  TempSrc: Oral Oral Oral Oral  Resp: 18 16 18 18   Height:      Weight:      SpO2: 95% 99% 98% 99%   No intake or output data in the 24 hours ending 10/06/12 1210 Filed Weights   10/05/12 2239  Weight: 82.192 kg (181 lb 3.2 oz)    Exam:   General:  Middle aged male in lying in bed in no acute distress  HEENT: No pallor, moist oral mucosa, right facial  droop with slurry speech  Cardiovascular: Normal S1 and S2, no murmurs rub or gallop, no carotid bruit  Respiratory: Clear to auscultation bilaterally no added sounds  Abdomen: Soft, nontender, nondistended, bowel sounds present  Musculoskeletal: Warm, no edema  CNS: AAO x3, has slurry speech, left facial droop, right handed power 3+ / 5 with impaired hand grip. Sensation intact. Power normal in right lower extremity.  Data Reviewed: Basic Metabolic Panel:  Recent Labs Lab 10/05/12 1819  NA 137  K 4.0  CL 101  CO2 21  GLUCOSE 116*  BUN 14  CREATININE 1.12  CALCIUM 10.2   Liver Function Tests:  Recent Labs Lab 10/05/12 1819  AST 21  ALT 17  ALKPHOS 59  BILITOT 0.2*  PROT 8.6*  ALBUMIN 4.2   No results found for this basename: LIPASE, AMYLASE,  in the last 168 hours No results found for this basename: AMMONIA,  in the last 168 hours CBC:  Recent Labs Lab 10/05/12 1819  WBC 5.8  NEUTROABS 3.0  HGB 16.0  HCT 43.2  MCV 88.2  PLT 277   Cardiac Enzymes: No results found for this basename: CKTOTAL, CKMB, CKMBINDEX, TROPONINI,  in the last 168 hours BNP (last 3 results) No results found for this basename: PROBNP,  in the last 8760 hours CBG: No results found for this basename: GLUCAP,  in the last 168 hours  No results found for this or any previous visit (from the past 240 hour(s)).   Studies: Dg Chest 2 View  10/06/2012  *RADIOLOGY REPORT*  Clinical Data: Stroke  CHEST -  2 VIEW  Comparison: 06/26/2012  Findings: Normal heart size.  Stable bronchitic changes.  No peripheral consolidation or mass.  No pleural effusion.  No pneumothorax.  IMPRESSION: No active cardiopulmonary disease.   Original Report Authenticated By: Jolaine Click, M.D.    Ct Head Wo Contrast  10/05/2012  *RADIOLOGY REPORT*  Clinical Data: Ataxia,  aphasia, weakness  CT HEAD WITHOUT CONTRAST  Technique:  Contiguous axial images were obtained from the base of the skull through the vertex  without contrast.  Comparison: T 01/20/2011  Findings: Ill-defined hypodensity in the left internal capsule and deep white matter, not present on the prior CT.  This is most likely due to acute infarct.  Negative for intracranial hemorrhage.  No mass lesion is present. No midline shift.  Ventricle size is normal.  IMPRESSION: Hypodensity left internal capsule and deep white matter on the left, consistent with acute infarct.  MRI and MRA of the head is suggested for further evaluation.   Original Report Authenticated By: Janeece Riggers, M.D.    Mr [facility] Wo Contrast  10/06/2012  **ADDENDUM** CREATED: 10/06/2012 08:32:18  Impression #1 should read:  Acute non hemorrhagic infarct involving the left lentiform nucleus and corona radiata.  **END ADDENDUM** SIGNED BY: Chauncey Fischer, M.D.   10/06/2012  *RADIOLOGY REPORT*  Clinical Data:  Stroke.  Right upper lower extremity weakness for 1 week.  Aphasia.  MRI HEAD WITHOUT CONTRAST MRA HEAD WITHOUT CONTRAST  Technique:  Multiplanar, multiecho pulse sequences of the brain and surrounding structures were obtained without intravenous contrast. Angiographic images of the head were obtained using MRA technique without contrast.  Comparison:  CT head without contrast 10/05/2012.  MRI HEAD  Findings:  The diffusion weighted images confirm an acute non hemorrhagic infarct within the left lentiform nucleus ascending into the left corona radiata.  There is an additional focus in the posterior insular cortex and scattered subcortical infarcts.  No acute hemorrhage or mass lesion is associated.  The study is mildly degraded by patient motion.  Scattered subcortical T2 hyperintensities are present in addition to the areas of acute infarction.  T2 signal is associated with each of the areas of restricted diffusion.  The ventricles are normal size.  No significant extra-axial fluid collection is present.  The globes and orbits are intact.  The paranasal sinuses are clear. There  is minimal fluid in the mastoid air cells bilaterally.  No obstructing nasopharyngeal lesion is evident.  IMPRESSION:  1.  No acute non hemorrhagic infarct involving the left lentiform nucleus and corona radiata. 2.  Additional focus of acute non hemorrhagic infarct in the posterior left insular cortex. 3.  Additional foci of punctate subcortical infarcts are present. 4.  Mild white matter disease is advanced for age.  MRA HEAD  Findings: The internal carotid arteries are within normal limits from the high cervical segments through the ICA termini. Significant tortuosity of the cervical right ICA is evident.  The left M1 segment is occluded just beyond the ICA terminus.  The A1 segments are within normal limits bilaterally.  The right M1 segment is normal.  The anterior communicating artery is not clearly seen, but may be obscured by motion artifact.  The right MCA branch vessels demonstrate segmental narrowing without a significant proximal stenosis or occlusion.  The right vertebral artery is slightly dominant to the left.  The PICA origins are visualized and within normal limits bilaterally. The basilar artery is normal.  Both posterior cerebral arteries originate from basilar tip.  There is some attenuation of distal PCA branch vessels.  IMPRESSION:  1.  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 2.  Mild to moderate small vessel disease is advanced for age.  The appearance is somewhat exaggerated by patient motion.  These results will be called to the ordering clinician or representative by the Radiologist Assistant, and communication documented in the PACS Dashboard.  Original Report Authenticated By: Marin Roberts, M.D.    Mr Brain Wo Contrast  10/06/2012  **ADDENDUM** CREATED: 10/06/2012 08:32:18  Impression #1 should read:  Acute non hemorrhagic infarct involving the left lentiform nucleus and corona radiata.  **END ADDENDUM** SIGNED BY: Chauncey Fischer, M.D.   10/06/2012  *RADIOLOGY REPORT*  Clinical Data:  Stroke.  Right upper lower extremity weakness for 1 week.  Aphasia.  MRI HEAD WITHOUT CONTRAST MRA HEAD WITHOUT CONTRAST  Technique:  Multiplanar, multiecho pulse sequences of the brain and surrounding structures were obtained without intravenous contrast. Angiographic images of the head were obtained using MRA technique without contrast.  Comparison:  CT head without contrast 10/05/2012.  MRI HEAD  Findings:  The diffusion weighted images confirm an acute non hemorrhagic infarct within the left lentiform nucleus ascending into the left corona radiata.  There is an additional focus in the posterior insular cortex and scattered subcortical infarcts.  No acute hemorrhage or mass lesion is associated.  The study is mildly degraded by patient motion.  Scattered subcortical T2 hyperintensities are present in addition to the areas of acute infarction.  T2 signal is associated with each of the areas of restricted diffusion.  The ventricles are normal size.  No significant extra-axial fluid collection is present.  The globes and orbits are intact.  The paranasal sinuses are clear. There is minimal fluid in the mastoid air cells bilaterally.  No obstructing nasopharyngeal lesion is evident.  IMPRESSION:  1.  No acute non hemorrhagic infarct involving the left lentiform nucleus and corona radiata. 2.  Additional focus of acute non hemorrhagic infarct in the posterior left insular cortex. 3.  Additional foci of punctate subcortical infarcts are present. 4.  Mild white matter disease is advanced for age.  MRA HEAD  Findings: The internal carotid arteries are within normal limits from the high cervical segments through the ICA termini. Significant tortuosity of the cervical right ICA is evident.  The left M1 segment is occluded just beyond the ICA terminus.  The A1 segments are within normal limits bilaterally.  The right M1 segment is normal.  The anterior  communicating artery is not clearly seen, but may be obscured by motion artifact.  The right MCA branch vessels demonstrate segmental narrowing without a significant proximal stenosis or occlusion.  The right vertebral artery is slightly dominant to the left.  The PICA origins are visualized and within normal limits bilaterally. The basilar artery is normal.  Both posterior cerebral arteries originate from basilar tip.  There is some attenuation of distal PCA branch vessels.  IMPRESSION:  1.  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 2.  Mild to moderate small vessel disease is advanced for age.  The appearance is somewhat exaggerated by patient motion.  These results will be called to the ordering clinician or representative by the Radiologist Assistant, and communication documented in the PACS Dashboard.  Original Report Authenticated By: Marin Roberts, M.D.     Scheduled Meds: . aspirin  324 mg Oral Daily  . enoxaparin (LOVENOX)  injection  40 mg Subcutaneous Q24H  . [START ON 10/07/2012] pneumococcal 23 valent vaccine  0.5 mL Intramuscular Tomorrow-1000  . simvastatin  20 mg Oral q1800   Continuous Infusions:     Time spent: 25 minutes    Vertis Scheib  [facility] Pager [phone]. If 7PM-7AM, please contact night-coverage at [email or web address], password [facility] 10/06/2012, 12:10 PM  LOS: 1 day

## 2012-10-06 NOTE — Consult Note (Signed)
Referring Physician: Dhungel    Chief Complaint: stroke  HPI:                                                                                                                                         Manuel Harrison is an 42 y.o. male who does not see a PCP, smokes one pack per day and drinks 12 beers daily.  Patient awoke with symptoms of right arm and leg weakness and numbness.  Time of onset is not clear.  Per hospitalist note it was one week ago, patient is tell me three days ago. 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  Patient was on no medications prior to hospitalization but UDS was positive for both THC and Cocaine. HE denies any sickle cell trait or sickel cell disease in his family or himself.   Date last known well: Unable to determine Time last known well: Unable to determine tPA Given: No: out of window  History reviewed. No pertinent past medical history.  History reviewed. No pertinent past surgical history.   Family history: No history of strokes at a young age in family.  No history of miscarriages in the women of the family.   Father: HTN Mother: HTN Social History:  reports that he has been smoking Cigarettes.  He has a 54 pack-year smoking history. He does not have any smokeless tobacco history on file. He reports that  drinks alcohol. He reports that he uses illicit drugs (Marijuana).  Allergies: No Known Allergies  Medications:                                                                                                                           Prior to Admission:  No prescriptions prior to admission   Scheduled: . aspirin  324 mg Oral Daily  . enoxaparin (LOVENOX) injection  40 mg Subcutaneous Q24H  . nicotine  21 mg Transdermal Daily  . [START  ON 10/07/2012] pneumococcal 23 valent vaccine  0.5 mL Intramuscular Tomorrow-1000  . simvastatin  20 mg Oral q1800    ROS:  History obtained from the patient  General ROS: negative for - chills, fatigue, fever, night sweats, weight gain or weight loss Psychological ROS: negative for - behavioral disorder, hallucinations, memory difficulties, mood swings or suicidal ideation Ophthalmic ROS: negative for - blurry vision, double vision, eye pain or loss of vision ENT ROS: negative for - epistaxis, nasal discharge, oral lesions, sore throat, tinnitus or vertigo Allergy and Immunology ROS: negative for - hives or itchy/watery eyes Hematological and Lymphatic ROS: negative for - bleeding problems, bruising or swollen lymph nodes Endocrine ROS: negative for - galactorrhea, hair pattern changes, polydipsia/polyuria or temperature intolerance Respiratory ROS: negative for - cough, hemoptysis, shortness of breath or wheezing Cardiovascular ROS: negative for - chest pain, dyspnea on exertion, edema or irregular heartbeat Gastrointestinal ROS: negative for - abdominal pain, diarrhea, hematemesis, nausea/vomiting or stool incontinence Genito-Urinary ROS: negative for - dysuria, hematuria, incontinence or urinary frequency/urgency Musculoskeletal ROS: negative for - joint swelling or muscular weakness Neurological ROS: as noted in HPI Dermatological ROS: negative for rash and skin lesion changes  Neurologic Examination:                                                                                                      Blood pressure 152/93, pulse 65, temperature 98.3 F (36.8 C), temperature source Oral, resp. rate 18, height 5\' 11"  (1.803 m), weight 82.192 kg (181 lb 3.2 oz), SpO2 98.00%.   Mental Status: Alert, oriented, thought content appropriate.  Speech dysarthric  but  fluent without evidence of aphasia.  Able to follow 3 step commands without difficulty. Cranial Nerves: II: Discs flat bilaterally; Visual fields grossly normal, pupils equal, round, reactive to light and accommodation III,IV, VI: ptosis not present, extra-ocular motions intact bilaterally V,VII: smile symmetric with right facial droop at rest, facial light touch sensation normal bilaterally VIII: hearing normal bilaterally IX,X: gag reflex present XI: decreased shoulder shrug on the right XII: midline tongue extension Motor: Right : Upper extremity   3/5    Left:     Upper extremity   5/5  Lower extremity   4/5     Lower extremity   5/5 Tone and bulk:normal tone throughout; no atrophy noted Sensory: Pinprick and light touch intact throughout, bilaterally but decreased on right arm and leg Deep Tendon Reflexes: 2+ and symmetric throughout--more brisk on the right KJ than left Plantars: Right: upgoing   Left: downgoing Cerebellar: normal finger-to-nose,  normal heel-to-shin test CV: pulses palpable throughout    Results for orders placed during the hospital encounter of 10/05/12 (from the past 48 hour(s))  CBC WITH DIFFERENTIAL     Status: Abnormal   Collection Time    10/05/12  6:19 PM      Result Value Range   WBC 5.8  4.0 - 10.5 K/uL   RBC 4.90  4.22 - 5.81 MIL/uL   Hemoglobin 16.0  13.0 - 17.0 g/dL   HCT 16.1  09.6 - 04.5 %   MCV 88.2  78.0 - 100.0 fL   MCH 32.7  26.0 - 34.0 pg   MCHC 37.0 (*)  30.0 - 36.0 g/dL   RDW 16.1  09.6 - 04.5 %   Platelets 277  150 - 400 K/uL   Neutrophils Relative 51  43 - 77 %   Neutro Abs 3.0  1.7 - 7.7 K/uL   Lymphocytes Relative 40  12 - 46 %   Lymphs Abs 2.3  0.7 - 4.0 K/uL   Monocytes Relative 6  3 - 12 %   Monocytes Absolute 0.4  0.1 - 1.0 K/uL   Eosinophils Relative 2  0 - 5 %   Eosinophils Absolute 0.1  0.0 - 0.7 K/uL   Basophils Relative 0  0 - 1 %   Basophils Absolute 0.0  0.0 - 0.1 K/uL  COMPREHENSIVE METABOLIC PANEL      Status: Abnormal   Collection Time    10/05/12  6:19 PM      Result Value Range   Sodium 137  135 - 145 mEq/L   Potassium 4.0  3.5 - 5.1 mEq/L   Chloride 101  96 - 112 mEq/L   CO2 21  19 - 32 mEq/L   Glucose, Bld 116 (*) 70 - 99 mg/dL   BUN 14  6 - 23 mg/dL   Creatinine, Ser 4.09  0.50 - 1.35 mg/dL   Calcium 81.1  8.4 - 91.4 mg/dL   Total Protein 8.6 (*) 6.0 - 8.3 g/dL   Albumin 4.2  3.5 - 5.2 g/dL   AST 21  0 - 37 U/L   ALT 17  0 - 53 U/L   Alkaline Phosphatase 59  39 - 117 U/L   Total Bilirubin 0.2 (*) 0.3 - 1.2 mg/dL   GFR calc non Af Amer 79 (*) >90 mL/min   GFR calc Af Amer >90  >90 mL/min   Comment:            The eGFR has been calculated     using the CKD EPI equation.     This calculation has not been     validated in all clinical     situations.     eGFR's persistently     <90 mL/min signify     possible Chronic Kidney Disease.  ETHANOL     Status: None   Collection Time    10/05/12  6:19 PM      Result Value Range   Alcohol, Ethyl (B) <11  0 - 11 mg/dL   Comment:            LOWEST DETECTABLE LIMIT FOR     SERUM ALCOHOL IS 11 mg/dL     FOR MEDICAL PURPOSES ONLY  URINE RAPID DRUG SCREEN (HOSP PERFORMED)     Status: Abnormal   Collection Time    10/05/12  7:39 PM      Result Value Range   Opiates NONE DETECTED  NONE DETECTED   Cocaine POSITIVE (*) NONE DETECTED   Benzodiazepines NONE DETECTED  NONE DETECTED   Amphetamines NONE DETECTED  NONE DETECTED   Tetrahydrocannabinol POSITIVE (*) NONE DETECTED   Barbiturates NONE DETECTED  NONE DETECTED   Comment:            DRUG SCREEN FOR MEDICAL PURPOSES     ONLY.  IF CONFIRMATION IS NEEDED     FOR ANY PURPOSE, NOTIFY LAB     WITHIN 5 DAYS.                LOWEST DETECTABLE LIMITS     FOR URINE DRUG SCREEN  Drug Class       Cutoff (ng/mL)     Amphetamine      1000     Barbiturate      200     Benzodiazepine   200     Tricyclics       300     Opiates          300     Cocaine          300     THC               50  LIPID PANEL     Status: Abnormal   Collection Time    10/06/12  5:04 AM      Result Value Range   Cholesterol 200  0 - 200 mg/dL   Triglycerides 161 (*) <150 mg/dL   HDL 27 (*) >09 mg/dL   Total CHOL/HDL Ratio 7.4     VLDL 45 (*) 0 - 40 mg/dL   LDL Cholesterol 604 (*) 0 - 99 mg/dL   Comment:            Total Cholesterol/HDL:CHD Risk     Coronary Heart Disease Risk Table                         Men   Women      1/2 Average Risk   3.4   3.3      Average Risk       5.0   4.4      2 X Average Risk   9.6   7.1      3 X Average Risk  23.4   11.0                Use the calculated Patient Ratio     above and the CHD Risk Table     to determine the patient's CHD Risk.                ATP III CLASSIFICATION (LDL):      <100     mg/dL   Optimal      540-981  mg/dL   Near or Above                        Optimal      130-159  mg/dL   Borderline      191-478  mg/dL   High      >295     mg/dL   Very High   Dg Chest 2 View  10/06/2012  *RADIOLOGY REPORT*  Clinical Data: Stroke  CHEST - 2 VIEW  Comparison: 06/26/2012  Findings: Normal heart size.  Stable bronchitic changes.  No peripheral consolidation or mass.  No pleural effusion.  No pneumothorax.  IMPRESSION: No active cardiopulmonary disease.   Original Report Authenticated By: Jolaine Click, M.D.    Ct Head Wo Contrast  10/05/2012  *RADIOLOGY REPORT*  Clinical Data: Ataxia,  aphasia, weakness  CT HEAD WITHOUT CONTRAST  Technique:  Contiguous axial images were obtained from the base of the skull through the vertex without contrast.  Comparison: T 01/20/2011  Findings: Ill-defined hypodensity in the left internal capsule and deep white matter, not present on the prior CT.  This is most likely due to acute infarct.  Negative for intracranial hemorrhage.  No mass lesion is present. No midline shift.  Ventricle size is normal.  IMPRESSION: Hypodensity left internal  capsule and deep white matter on the left, consistent with acute infarct.   MRI and MRA of the head is suggested for further evaluation.   Original Report Authenticated By: Janeece Riggers, M.D.    Mr [facility] Wo Contrast  10/06/2012  **ADDENDUM** CREATED: 10/06/2012 08:32:18  Impression #1 should read:  Acute non hemorrhagic infarct involving the left lentiform nucleus and corona radiata.  **END ADDENDUM** SIGNED BY: Chauncey Fischer, M.D.   10/06/2012  *RADIOLOGY REPORT*  Clinical Data:  Stroke.  Right upper lower extremity weakness for 1 week.  Aphasia.  MRI HEAD WITHOUT CONTRAST MRA HEAD WITHOUT CONTRAST  Technique:  Multiplanar, multiecho pulse sequences of the brain and surrounding structures were obtained without intravenous contrast. Angiographic images of the head were obtained using MRA technique without contrast.  Comparison:  CT head without contrast 10/05/2012.  MRI HEAD  Findings:  The diffusion weighted images confirm an acute non hemorrhagic infarct within the left lentiform nucleus ascending into the left corona radiata.  There is an additional focus in the posterior insular cortex and scattered subcortical infarcts.  No acute hemorrhage or mass lesion is associated.  The study is mildly degraded by patient motion.  Scattered subcortical T2 hyperintensities are present in addition to the areas of acute infarction.  T2 signal is associated with each of the areas of restricted diffusion.  The ventricles are normal size.  No significant extra-axial fluid collection is present.  The globes and orbits are intact.  The paranasal sinuses are clear. There is minimal fluid in the mastoid air cells bilaterally.  No obstructing nasopharyngeal lesion is evident.  IMPRESSION:  1.  No acute non hemorrhagic infarct involving the left lentiform nucleus and corona radiata. 2.  Additional focus of acute non hemorrhagic infarct in the posterior left insular cortex. 3.  Additional foci of punctate subcortical infarcts are present. 4.  Mild white matter disease is advanced for age.  MRA  HEAD  Findings: The internal carotid arteries are within normal limits from the high cervical segments through the ICA termini. Significant tortuosity of the cervical right ICA is evident.  The left M1 segment is occluded just beyond the ICA terminus.  The A1 segments are within normal limits bilaterally.  The right M1 segment is normal.  The anterior communicating artery is not clearly seen, but may be obscured by motion artifact.  The right MCA branch vessels demonstrate segmental narrowing without a significant proximal stenosis or occlusion.  The right vertebral artery is slightly dominant to the left.  The PICA origins are visualized and within normal limits bilaterally. The basilar artery is normal.  Both posterior cerebral arteries originate from basilar tip.  There is some attenuation of distal PCA branch vessels.  IMPRESSION:  1.  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 2.  Mild to moderate small vessel disease is advanced for age.  The appearance is somewhat exaggerated by patient motion.  These results will be called to the ordering clinician or representative by the Radiologist Assistant, and communication documented in the PACS Dashboard.  Original Report Authenticated By: Marin Roberts, M.D.    Mr Brain Wo Contrast  10/06/2012  **ADDENDUM** CREATED: 10/06/2012 08:32:18  Impression #1 should read:  Acute non hemorrhagic infarct involving the left lentiform nucleus and corona radiata.  **END ADDENDUM** SIGNED BY: Chauncey Fischer, M.D.   10/06/2012  *RADIOLOGY REPORT*  Clinical Data:  Stroke.  Right upper lower extremity weakness for 1 week.  Aphasia.  MRI HEAD WITHOUT CONTRAST MRA HEAD WITHOUT CONTRAST  Technique:  Multiplanar, multiecho pulse sequences of the brain and surrounding structures were obtained without intravenous contrast. Angiographic images of the head were obtained using MRA technique without contrast.  Comparison:  CT head  without contrast 10/05/2012.  MRI HEAD  Findings:  The diffusion weighted images confirm an acute non hemorrhagic infarct within the left lentiform nucleus ascending into the left corona radiata.  There is an additional focus in the posterior insular cortex and scattered subcortical infarcts.  No acute hemorrhage or mass lesion is associated.  The study is mildly degraded by patient motion.  Scattered subcortical T2 hyperintensities are present in addition to the areas of acute infarction.  T2 signal is associated with each of the areas of restricted diffusion.  The ventricles are normal size.  No significant extra-axial fluid collection is present.  The globes and orbits are intact.  The paranasal sinuses are clear. There is minimal fluid in the mastoid air cells bilaterally.  No obstructing nasopharyngeal lesion is evident.  IMPRESSION:  1.  No acute non hemorrhagic infarct involving the left lentiform nucleus and corona radiata. 2.  Additional focus of acute non hemorrhagic infarct in the posterior left insular cortex. 3.  Additional foci of punctate subcortical infarcts are present. 4.  Mild white matter disease is advanced for age.  MRA HEAD  Findings: The internal carotid arteries are within normal limits from the high cervical segments through the ICA termini. Significant tortuosity of the cervical right ICA is evident.  The left M1 segment is occluded just beyond the ICA terminus.  The A1 segments are within normal limits bilaterally.  The right M1 segment is normal.  The anterior communicating artery is not clearly seen, but may be obscured by motion artifact.  The right MCA branch vessels demonstrate segmental narrowing without a significant proximal stenosis or occlusion.  The right vertebral artery is slightly dominant to the left.  The PICA origins are visualized and within normal limits bilaterally. The basilar artery is normal.  Both posterior cerebral arteries originate from basilar tip.  There is  some attenuation of distal PCA branch vessels.  IMPRESSION:  1.  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 2.  Mild to moderate small vessel disease is advanced for age.  The appearance is somewhat exaggerated by patient motion.  These results will be called to the ordering clinician or representative by the Radiologist Assistant, and communication documented in the PACS Dashboard.  Original Report Authenticated By: Marin Roberts, M.D.    VASCULAR LAB  PRELIMINARY PRELIMINARY PRELIMINARY PRELIMINARY  Carotid duplex completed Preliminary report: Bilateral: No evidence of hemodynamically significant internal carotid artery stenosis. Vertebral artery flow is antegrade.   Assessment and plan discussed with with attending physician and they are in agreement.    Felicie Morn PA-C [facility] [phone]  10/06/2012, 1:43 PM   Patient seen and examined.  Clinical course and management discussed.  Necessary edits performed.  I agree with the above.  Assessment and plan of care developed and discussed below.    Assessment: 42 y.o. male presenting with right sided weakness on exam and imaging significant for an acute left lentiform/corona radiata infarct.  MRA shows occlusion of the proximal left MCA.  Patient with risk factors for small vessel disease but clearly quite young for an acute infarct.  Echo and carotid doppler are unremarkable.  LDL elevated.  Patient started on a cholesterol lowering  agent.  ASA started as well as prophylactic therapy.    Stroke Risk Factors - smoking, hyperlipidemia  Plan: 1. Work up for stroke in the young to include ATIII, lupus anticoagulant, factor V, homocysteine, ANA, ESR, Protein C, protein C, anticardiolipin antibody.   2. Continue ASA daily 3. PT consult, OT consult, Speech consult 4. Agree with Lipid lowering agent. 5. Telemetry monitoring 6. Frequent neuro checks  Thana Farr, MD [facility] [phone]  10/06/2012  4:52 PM

## 2012-10-06 NOTE — Evaluation (Signed)
Speech Language Pathology Evaluation Patient Details Name: Manuel Harrison MRN: [id number] DOB: February 04, 1971 Today's Date: 10/06/2012 Time: 4098-1191 SLP Time Calculation (min): 35 min  Problem List:  Patient Active Problem List  Diagnosis  . Stroke  . Right sided weakness  . Tobacco abuse disorder  . Cocaine use  . Other and unspecified hyperlipidemia   Past Medical History: History reviewed. No pertinent past medical history. Past Surgical History: History reviewed. No pertinent past surgical history. HPI:  42 yo male adm to [facility] with right side weakness, right facial droop, slurred speech x1 week prior to admission.  Pt CT head showed hypodensity left internal capsule and deep white matter on left.   Speech, PT, OT ordered.    Assessment / Plan / Recommendation Clinical Impression  Pt presents with mild dysarthria and cognitive-linguistic deficits - ? baseline level given h/o etoh use.  Pt unable to state his home address, phone number even after informed of current street.  Year was incorrectly stated - although pt with awareness to incorrect information.  Pt able to read proper year when directed to calendar.   He was able to answer complex yes/no questions correctly and answer questions at short phrase/sentence level without paraphasias or delays noted.  Pt demonstrated generative naming of 10 animals within 60 seconds and followed 2 steps commands adequately- impaired at 3 step level.  Pt would benefit from further SLP to maximize articulation, cog-ling skills in his current environment to aid in transitioning home with 24/7 supervision.      Therapy initiated with review and demonstration of compensatory strategies to improve speech clarity and provided tips for memory compensation.  Pt verbalized understanding to information.      SLP Assessment  Patient needs continued Speech Lanaguage Pathology Services    Follow Up Recommendations       Frequency and Duration min 2x/week   2 weeks   Pertinent Vitals/Pain Afebrile, decreased   SLP Goals  SLP Goals Potential to Achieve Goals: Fair Potential Considerations: Ability to learn/carryover information;Financial resources;Family/community support;Cooperation/participation level SLP Goal #1: Pt will be oriented x4 and state his current address and cell number with mod I.   SLP Goal #2: Pt will demonstrate speech intelligiblity at phrase level with 80% intelligiblity and min verbal cues.  SLP Goal #3: Pt will read paragraph level material and answer basic questions with 80% accuracy and mod I assist.   SLP Goal #4: Pt will follow 3 step commands during functional tasks with 80% accuracy and mod I assist.    SLP Evaluation Prior Functioning  Cognitive/Linguistic Baseline: Information not available (pt denies baseline memory deficits) Lives With: Significant other (per pt) Available Help at Discharge: Friend(s) Education:  (11th grade) Vocation: Unemployed (x one year)   Cognition  Arousal/Alertness: Awake/alert Orientation Level: Oriented to person;Oriented to time;Oriented to situation;Oriented to place ("hospital") Attention: Sustained Sustained Attention: Appears intact Memory: Impaired (pt unable to state his home address, lived there 2 years) Awareness: Appears intact (to speech deficits) Problem Solving: Impaired (pt recognized not to answer phone when pt stopped ringing) Problem Solving Impairment: Verbal basic;Functional basic Behaviors: Restless Safety/Judgment: Impaired (pt desires to ambulate without assist, advised he call) Comments: pt states his girlfriend pays the bills at home and is available 24/7 to do cooking/cleaning/home management, pt was unable to state his home address though he has lived there for 2 years - he states this is a new deficit    Comprehension  Auditory Comprehension Yes/No Questions: Within Functional Limits  Basic Immediate Environment Questions: 75-100% accurate Other  Yes/No Questions Comments`: Minimal Commands: Impaired Two Step Basic Commands: 75-100% accurate Multistep Basic Commands: 25-49% accurate Conversation: Complex (re: home environment) Interfering Components: Attention Visual Recognition/Discrimination Discrimination: Within Function Limits Reading Comprehension Reading Status:  (pt read calendar and list of compensatory strategies)    Expression Expression Primary Mode of Expression: Verbal Verbal Expression Overall Verbal Expression: Appears within functional limits for tasks assessed Initiation: No impairment Level of Generative/Spontaneous Verbalization: Sentence Repetition: No impairment Naming: No impairment Pragmatics:  (flat affect) Other Verbal Expression Comments: pt does not expound on information provided, answers questions in short phrases Written Expression Dominant Hand: Right Written Expression: Not tested (pt hemiparetic)   Oral / Motor Oral Motor/Sensory Function Overall Oral Motor/Sensory Function: Impaired Facial Symmetry: Right droop Facial Sensation: Within Functional Limits Velum: Within Functional Limits Mandible: Within Functional Limits Motor Speech Overall Motor Speech: Impaired Phonation: Low vocal intensity (decreased phonatory strength) Resonance: Within functional limits Articulation: Impaired Level of Impairment: Word Intelligibility: Intelligibility reduced Word: 50-74% accurate Phrase: 50-74% accurate Sentence: 25-49% accurate Conversation:  (pt not communicating at this level) Motor Planning: Impaired Motor Speech Errors: Aware Effective Techniques: Slow rate;Over-articulate   GO     Donavan Burnet, MS [facility] SLP [phone]

## 2012-10-06 NOTE — Evaluation (Signed)
Physical Therapy Evaluation Patient Details Name: Manuel Harrison MRN: [id number] DOB: 12/11/1970 Today's Date: 10/06/2012 Time: 1250-1309 PT Time Calculation (min): 19 min  PT Assessment / Plan / Recommendation Clinical Impression  Pt. is  42 yo male admitted 3/13 with 1 week h/o R sided weakness, aphasia. Pt. dx. w/ L internal capsule infarct. Pt . was able to ambulate with RW with 1 assist for safety. Pt. minimally verbally responsive but followed commands. Pt. does not indicate 24/7 caregivers. Pt. may benefit from CIR c/s. Pt. will benefit from PT while in acute care.    PT Assessment  Patient needs continued PT services    Follow Up Recommendations  CIR;Supervision/Assistance - 24 hour    Does the patient have the potential to tolerate intense rehabilitation      Barriers to Discharge Decreased caregiver support      Equipment Recommendations  Rolling walker with 5" wheels    Recommendations for Other Services     Frequency Min 4X/week    Precautions / Restrictions Precautions Precautions: Fall Restrictions Weight Bearing Restrictions: No         Mobility  Bed Mobility Bed Mobility: Sit to Supine Sit to Supine: 5: Supervision;HOB flat Transfers Sit to Stand: 4: Min assist Details for Transfer Assistance: cues for hand placement Ambulation/Gait Ambulation/Gait Assistance: 4: Min assist;4: Min guard Ambulation Distance (Feet): 200 Feet Assistive device: Rolling walker Ambulation/Gait Assistance Details: pt is able to grip R  hand on RW . znoted gait less stable at end of walk, noted decreased swing RLE. tendency to ADDuct during swing. Gait Pattern: Step-through pattern;Decreased step length - right;Scissoring;Decreased hip/knee flexion - right Gait velocity: decr.    Exercises     PT Diagnosis: Difficulty walking (hemiparesis)  PT Problem List: Decreased strength;Decreased activity tolerance;Decreased balance;Decreased mobility;Decreased  coordination;Decreased cognition;Decreased knowledge of use of DME;Decreased safety awareness;Decreased knowledge of precautions PT Treatment Interventions: DME instruction;Gait training;Functional mobility training;Therapeutic activities;Therapeutic exercise;Balance training;Neuromuscular re-education;Patient/family education   PT Goals Acute Rehab PT Goals PT Goal Formulation: With patient Time For Goal Achievement: 10/20/12 Potential to Achieve Goals: Good Pt will go Supine/Side to Sit: Independently PT Goal: Supine/Side to Sit - Progress: Goal set today Pt will go Sit to Supine/Side: Independently PT Goal: Sit to Supine/Side - Progress: Goal set today Pt will go Sit to Stand: Independently PT Goal: Sit to Stand - Progress: Goal set today Pt will go Stand to Sit: Independently PT Goal: Stand to Sit - Progress: Goal set today Pt will Ambulate: >150 feet;with modified independence;with least restrictive assistive device PT Goal: Ambulate - Progress: Goal set today  Visit Information  Last PT Received On: 10/06/12 Assistance Needed: +1    Subjective Data  Subjective: minimal expression. did answer some basic qiestions. stated 'I don't know" to name this place. Patient Stated Goal: pt agreed to therapy.   Prior Functioning  Home Living Lives With: Alone (per chart) Additional Comments: 24 hour assistance unknown, pt did not volunteer info, Prior Function Level of Independence: Independent Communication Communication:  (pt has very short answers--uh huh/ uh used a lot  ? aphasic ) Dominant Hand: Right    Cognition  Cognition Overall Cognitive Status: Appears within functional limits for tasks assessed/performed Arousal/Alertness: Awake/alert Orientation Level:  (stated hospital, not which one.) Behavior During Session: [facility] for tasks performed    Extremity/Trunk Assessment Right Upper Extremity Assessment RUE ROM/Strength/Tone: Deficits RUE ROM/Strength/Tone Deficits: has  isolated movement but shoulder 3-/5 strength.  Able to oppose fingers to thumb--used as an  assist when opening lotion RUE Sensation:  (reports [facility]) RUE Coordination:  (uses as assist) Left Upper Extremity Assessment LUE ROM/Strength/Tone: Within functional levels Right Lower Extremity Assessment RLE ROM/Strength/Tone: Deficits RLE ROM/Strength/Tone Deficits: dorsiflex 3, hip flexion 4, knee ext 4. RLE Sensation: WFL - Light Touch Left Lower Extremity Assessment LLE ROM/Strength/Tone: WFL for tasks assessed LLE Sensation: WFL - Light Touch Trunk Assessment Trunk Assessment: Normal   Balance Balance Balance Assessed: Yes Static Sitting Balance Static Sitting - Balance Support: No upper extremity supported Static Sitting - Level of Assistance: 7: Independent  End of Session PT - End of Session Equipment Utilized During Treatment: Gait belt Activity Tolerance: Patient tolerated treatment well Patient left: in chair;with call bell/phone within reach Nurse Communication: Mobility status  GP     Rada Hay 10/06/2012, 4:14 PM [phone]

## 2012-10-06 NOTE — Progress Notes (Signed)
UR completed 

## 2012-10-07 DIAGNOSIS — I639 Cerebral infarction, unspecified: Secondary | ICD-10-CM | POA: Diagnosis present

## 2012-10-07 DIAGNOSIS — F1410 Cocaine abuse, uncomplicated: Secondary | ICD-10-CM

## 2012-10-07 LAB — HOMOCYSTEINE: Homocysteine: 14 umol/L (ref 4.0–15.4)

## 2012-10-07 NOTE — Progress Notes (Signed)
Occupational Therapy Treatment Patient Details Name: Manuel Harrison MRN: [id number] DOB: [birth year] Today's Date: 10/07/2012 Time: 2956-2130 OT Time Calculation (min): 27 min  OT Assessment / Plan / Recommendation Comments on Treatment Session Pt making good progress, overall at a min guard to min assist level for mobility and balance.  Still with increased safety risk if attempting to get up without assistance.  Also feel he has poor safety awareness and will need supervision for safety.  Issued FM coordination exercises for the RUE during session.    Follow Up Recommendations  Supervision/Assistance - 24 hour;CIR       Equipment Recommendations  None recommended by OT       Frequency Min 3X/week      Precautions / Restrictions Precautions Precautions: Fall Precaution Comments: mild right hemiparesis Restrictions Weight Bearing Restrictions: No   Pertinent Vitals/Pain Vitals stable, no report of pain    ADL  Toilet Transfer: Simulated;Min guard Toilet Transfer Method: Other (comment) (ambulating without assistive device) Equipment Used: Gait belt Transfers/Ambulation Related to ADLs: Pt overall min guard assist for mobility without use of assistive device. ADL Comments: Pt able to ambulate with min guard. Also able to perform head turns and stop/start without LOB as well.  Did exhibit LOB to the right frequently when attempting to stand statically in tandem position.  Issued FM handout and instructed pt/wife on performance of FM tasks to help increase gross motor coordintion.       OT Goals ADL Goals ADL Goal: Toilet Transfer - Progress: Progressing toward goals Miscellaneous OT Goals OT Goal: Miscellaneous Goal #2 - Progress: Progressing toward goals  Visit Information  Last OT Received On: 10/07/12 Assistance Needed: +1    Subjective Data  Subjective: Pt's wife states that he lives with his girlfriend.      Cognition  Cognition Overall Cognitive Status:  Impaired Area of Impairment: Following commands Arousal/Alertness: Awake/alert Orientation Level: Appears intact for tasks assessed Behavior During Session: Other (comment) (impulsive) Following Commands: Follows multi-step commands inconsistently Cognition - Other Comments: Pt with difficulty following instructions for in-hand FM manipulation task for the RUE.  Needed max demonstrational cueing for correct technique demonstrated by therapist.    Mobility  Bed Mobility Bed Mobility: Sit to Supine Sit to Supine: 6: Modified independent (Device/Increase time) Transfers Transfers: Sit to Stand Sit to Stand: 5: Supervision;Without upper extremity assist       Balance Balance Balance Assessed: Yes Dynamic Standing Balance Dynamic Standing - Balance Support: No upper extremity supported Dynamic Standing - Level of Assistance: 4: Min assist;Other (comment) (Pt with frequent LOB when standing in tandem)   End of Session OT - End of Session Equipment Utilized During Treatment: Gait belt Activity Tolerance: Patient tolerated treatment well Patient left: in bed;with call bell/phone within reach;with family/visitor present Nurse Communication: Mobility status     Huyen Perazzo OTR/L Pager number [phone] 10/07/2012, 1:37 PM

## 2012-10-07 NOTE — Progress Notes (Signed)
[facility] PROGRESS NOTE  Manuel Harrison ZOX:[id number] DOB: Mar 25, 1971 DOA: 10/05/2012 PCP: No primary provider on file.  Brief narrative  42 year old male with history off heavy smoking and cocaine use presenting with acute onset of right upper extremity weakness and left facial droop with CT head showing acute left internal capsule infarct.  Assessment/Plan:  Acute left sided CVA  As evident on head CT . MRI Brain showing acute infarct involving left lentiform nucleus and corona radiate along with infarct over posterior left insular cortex and subcortical infarcts. Risk factors include heavy smoking, cocaine use and hyperlipidemia.  - still has residual left facial droop and right upper extremity weakness but is improved from yesterday..  -Continue full dose aspirin. Elevated LDL and triglycerides noted and started on Zocor 20 mg daily. -Appreciate neurology evaluation and recommendation. Sent for hypercoagulable workup given his age and acute onset of symptoms.  -PT/OT eval.  -Carotid Doppler and 2-D echo unremarkable or any source of emboli.. hemoglobin A1c normal. -Continue neurochecks  -Allow permissive hypertension.  -Counseled strongly on suggestion of smoking and substance abuse  -Seen by inpatient rehabilitation and will reevaluate on Monday.  Code Status: Full code  Family Communication: None at bedside  Disposition Plan: Will be reevaluated by inpatient rehabilitation on Monday   Consultants:  Neurology  Procedures:  None Antibiotics:  None   HPI/Subjective: Patient seen and examined this morning . His speech and right-sided weakness have been improving. Objective: Filed Vitals:   10/06/12 1457 10/06/12 2200 10/07/12 0151 10/07/12 0623  BP: 129/80 131/83 129/87 153/89  Pulse: 71 69 71 75  Temp: 98.3 F (36.8 C) 98.3 F (36.8 C) 98.2 F (36.8 C) 98.1 F (36.7 C)  TempSrc: Oral Oral Oral Oral  Resp: 18 18 18 18   Height:      Weight:      SpO2: 98%  96% 99% 98%    Intake/Output Summary (Last 24 hours) at 10/07/12 1131 Last data filed at 10/06/12 1302  Gross per 24 hour  Intake    240 ml  Output      0 ml  Net    240 ml   Filed Weights   10/05/12 2239  Weight: 82.192 kg (181 lb 3.2 oz)    Exam:  General: Middle aged male in lying in bed in no acute distress  HEENT: No pallor, moist oral mucosa, right facial droop with slurry speech  Cardiovascular: Normal S1 and S2, no murmurs rub or gallop, no carotid bruit  Respiratory: Clear to auscultation bilaterally no added sounds  Abdomen: Soft, nontender, nondistended, bowel sounds present  Musculoskeletal: Warm, no edema  CNS: AAO x3, has some slurry speech, left facial droop, power over right hand 4/5 and improved from yesterday. Handgrip also improved. Sensation intact. Power normal in right lower extremity.  Data Reviewed: Basic Metabolic Panel:  Recent Labs Lab 10/05/12 1819  NA 137  K 4.0  CL 101  CO2 21  GLUCOSE 116*  BUN 14  CREATININE 1.12  CALCIUM 10.2   Liver Function Tests:  Recent Labs Lab 10/05/12 1819  AST 21  ALT 17  ALKPHOS 59  BILITOT 0.2*  PROT 8.6*  ALBUMIN 4.2   No results found for this basename: LIPASE, AMYLASE,  in the last 168 hours No results found for this basename: AMMONIA,  in the last 168 hours CBC:  Recent Labs Lab 10/05/12 1819  WBC 5.8  NEUTROABS 3.0  HGB 16.0  HCT 43.2  MCV 88.2  PLT 277  Cardiac Enzymes: No results found for this basename: CKTOTAL, CKMB, CKMBINDEX, TROPONINI,  in the last 168 hours BNP (last 3 results) No results found for this basename: PROBNP,  in the last 8760 hours CBG: No results found for this basename: GLUCAP,  in the last 168 hours  No results found for this or any previous visit (from the past 240 hour(s)).   Studies: Dg Chest 2 View  10/06/2012  *RADIOLOGY REPORT*  Clinical Data: Stroke  CHEST - 2 VIEW  Comparison: 06/26/2012  Findings: Normal heart size.  Stable bronchitic changes.   No peripheral consolidation or mass.  No pleural effusion.  No pneumothorax.  IMPRESSION: No active cardiopulmonary disease.   Original Report Authenticated By: Jolaine Click, M.D.    Ct Head Wo Contrast  10/05/2012  *RADIOLOGY REPORT*  Clinical Data: Ataxia,  aphasia, weakness  CT HEAD WITHOUT CONTRAST  Technique:  Contiguous axial images were obtained from the base of the skull through the vertex without contrast.  Comparison: T 01/20/2011  Findings: Ill-defined hypodensity in the left internal capsule and deep white matter, not present on the prior CT.  This is most likely due to acute infarct.  Negative for intracranial hemorrhage.  No mass lesion is present. No midline shift.  Ventricle size is normal.  IMPRESSION: Hypodensity left internal capsule and deep white matter on the left, consistent with acute infarct.  MRI and MRA of the head is suggested for further evaluation.   Original Report Authenticated By: Janeece Riggers, M.D.    Mr [facility] Wo Contrast  10/06/2012  **ADDENDUM** CREATED: 10/06/2012 08:32:18  Impression #1 should read:  Acute non hemorrhagic infarct involving the left lentiform nucleus and corona radiata.  **END ADDENDUM** SIGNED BY: Chauncey Fischer, M.D.   10/06/2012  *RADIOLOGY REPORT*  Clinical Data:  Stroke.  Right upper lower extremity weakness for 1 week.  Aphasia.  MRI HEAD WITHOUT CONTRAST MRA HEAD WITHOUT CONTRAST  Technique:  Multiplanar, multiecho pulse sequences of the brain and surrounding structures were obtained without intravenous contrast. Angiographic images of the head were obtained using MRA technique without contrast.  Comparison:  CT head without contrast 10/05/2012.  MRI HEAD  Findings:  The diffusion weighted images confirm an acute non hemorrhagic infarct within the left lentiform nucleus ascending into the left corona radiata.  There is an additional focus in the posterior insular cortex and scattered subcortical infarcts.  No acute hemorrhage or mass lesion  is associated.  The study is mildly degraded by patient motion.  Scattered subcortical T2 hyperintensities are present in addition to the areas of acute infarction.  T2 signal is associated with each of the areas of restricted diffusion.  The ventricles are normal size.  No significant extra-axial fluid collection is present.  The globes and orbits are intact.  The paranasal sinuses are clear. There is minimal fluid in the mastoid air cells bilaterally.  No obstructing nasopharyngeal lesion is evident.  IMPRESSION:  1.  No acute non hemorrhagic infarct involving the left lentiform nucleus and corona radiata. 2.  Additional focus of acute non hemorrhagic infarct in the posterior left insular cortex. 3.  Additional foci of punctate subcortical infarcts are present. 4.  Mild white matter disease is advanced for age.  MRA HEAD  Findings: The internal carotid arteries are within normal limits from the high cervical segments through the ICA termini. Significant tortuosity of the cervical right ICA is evident.  The left M1 segment is occluded just beyond the ICA terminus.  The  A1 segments are within normal limits bilaterally.  The right M1 segment is normal.  The anterior communicating artery is not clearly seen, but may be obscured by motion artifact.  The right MCA branch vessels demonstrate segmental narrowing without a significant proximal stenosis or occlusion.  The right vertebral artery is slightly dominant to the left.  The PICA origins are visualized and within normal limits bilaterally. The basilar artery is normal.  Both posterior cerebral arteries originate from basilar tip.  There is some attenuation of distal PCA branch vessels.  IMPRESSION:  1.  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 2.  Mild to moderate small vessel disease is advanced for age.  The appearance is somewhat exaggerated by patient motion.  These results will be called to the ordering clinician or  representative by the Radiologist Assistant, and communication documented in the PACS Dashboard.  Original Report Authenticated By: Marin Roberts, M.D.    Mr Brain Wo Contrast  10/06/2012  **ADDENDUM** CREATED: 10/06/2012 08:32:18  Impression #1 should read:  Acute non hemorrhagic infarct involving the left lentiform nucleus and corona radiata.  **END ADDENDUM** SIGNED BY: Chauncey Fischer, M.D.   10/06/2012  *RADIOLOGY REPORT*  Clinical Data:  Stroke.  Right upper lower extremity weakness for 1 week.  Aphasia.  MRI HEAD WITHOUT CONTRAST MRA HEAD WITHOUT CONTRAST  Technique:  Multiplanar, multiecho pulse sequences of the brain and surrounding structures were obtained without intravenous contrast. Angiographic images of the head were obtained using MRA technique without contrast.  Comparison:  CT head without contrast 10/05/2012.  MRI HEAD  Findings:  The diffusion weighted images confirm an acute non hemorrhagic infarct within the left lentiform nucleus ascending into the left corona radiata.  There is an additional focus in the posterior insular cortex and scattered subcortical infarcts.  No acute hemorrhage or mass lesion is associated.  The study is mildly degraded by patient motion.  Scattered subcortical T2 hyperintensities are present in addition to the areas of acute infarction.  T2 signal is associated with each of the areas of restricted diffusion.  The ventricles are normal size.  No significant extra-axial fluid collection is present.  The globes and orbits are intact.  The paranasal sinuses are clear. There is minimal fluid in the mastoid air cells bilaterally.  No obstructing nasopharyngeal lesion is evident.  IMPRESSION:  1.  No acute non hemorrhagic infarct involving the left lentiform nucleus and corona radiata. 2.  Additional focus of acute non hemorrhagic infarct in the posterior left insular cortex. 3.  Additional foci of punctate subcortical infarcts are present. 4.  Mild white  matter disease is advanced for age.  MRA HEAD  Findings: The internal carotid arteries are within normal limits from the high cervical segments through the ICA termini. Significant tortuosity of the cervical right ICA is evident.  The left M1 segment is occluded just beyond the ICA terminus.  The A1 segments are within normal limits bilaterally.  The right M1 segment is normal.  The anterior communicating artery is not clearly seen, but may be obscured by motion artifact.  The right MCA branch vessels demonstrate segmental narrowing without a significant proximal stenosis or occlusion.  The right vertebral artery is slightly dominant to the left.  The PICA origins are visualized and within normal limits bilaterally. The basilar artery is normal.  Both posterior cerebral arteries originate from basilar tip.  There is some attenuation of distal PCA branch vessels.  IMPRESSION:  1.  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 2.  Mild to moderate small vessel disease is advanced for age.  The appearance is somewhat exaggerated by patient motion.  These results will be called to the ordering clinician or representative by the Radiologist Assistant, and communication documented in the PACS Dashboard.  Original Report Authenticated By: Marin Roberts, M.D.     Scheduled Meds: . aspirin  324 mg Oral Daily  . enoxaparin (LOVENOX) injection  40 mg Subcutaneous Q24H  . nicotine  21 mg Transdermal Daily  . pneumococcal 23 valent vaccine  0.5 mL Intramuscular Tomorrow-1000  . simvastatin  20 mg Oral q1800   Continuous Infusions:     Time spent: 25 MINUTES    Eddie North  [facility] Pager [phone] If 7PM-7AM, please contact night-coverage at [email or web address], password [facility] 10/07/2012, 11:31 AM  LOS: 2 days

## 2012-10-08 NOTE — Progress Notes (Signed)
Occupational Therapy Treatment Patient Details Name: Manuel Harrison MRN: [id number] DOB: 11/26/1970 Today's Date: 10/08/2012 Time: 4098-1191 OT Time Calculation (min): 41 min  OT Assessment / Plan / Recommendation Comments on Treatment Session Pt still with decreased dynamic standing balance when performing higher level activities as well as decreased FM and gross motor coordination and strength in the RUE.   Feel he is making good progress but is still a high risk for fall at this time.  Recommend HHOT at discharge and 24 hour supervision.    Follow Up Recommendations  Home health OT       Equipment Recommendations  None recommended by OT       Frequency Min 3X/week   Plan Discharge plan needs to be updated    Precautions / Restrictions Precautions Precautions: Fall Precaution Comments: mild right hemiparesis Restrictions Weight Bearing Restrictions: No   Pertinent Vitals/Pain No report of pain during session.    ADL  Toilet Transfer: Simulated;Min Pension scheme manager Method: Other (comment) (ambulating without assistive device) Toilet Transfer Equipment: Regular height toilet Toileting - Clothing Manipulation and Hygiene: Simulated;Min guard Where Assessed - Toileting Clothing Manipulation and Hygiene:  (simulated sit to stand from the EOB) Transfers/Ambulation Related to ADLs: Pt still min guard for mobility without assistive device.  Occasional lean/ step to the right to regain balance. ADL Comments: Pt performed coordination tasks with the RUE by tossing small ball back and forth with therapist.  Pt able to catch ball with 80 % accuracy.  Issued orange theraband and instructed pt on shoulder flexion, abduction, and horizontal abduction exercises.  Pt's wife has also purchased him lots of FM activities to continue working on RUE coordination as well (cards, therapy ball, bible for flipping pages)    OT Goals Acute Rehab OT Goals Potential to Achieve Goals: Good ADL  Goals ADL Goal: Toilet Transfer - Progress: Progressing toward goals Miscellaneous OT Goals OT Goal: Miscellaneous Goal #2 - Progress: Progressing toward goals  Visit Information  Last OT Received On: 10/08/12 Assistance Needed: +1    Subjective Data  Subjective: When am I gonna get to go home?      Cognition  Cognition Overall Cognitive Status: Impaired Area of Impairment: Safety/judgement Orientation Level: Appears intact for tasks assessed Behavior During Session: [facility] for tasks performed Following Commands: Follows multi-step commands with increased time Safety/Judgement - Other Comments: Feel pt will likely attempt to get up without assistance. Cognition - Other Comments: Pt more consistently able to follow one to two step commands today related to functional tasks.    Mobility  Bed Mobility Bed Mobility: Supine to Sit Supine to Sit: 7: Independent Sit to Supine: 7: Independent Transfers Transfers: Sit to Stand;Stand to Sit Sit to Stand: 6: Modified independent (Device/Increase time);Without upper extremity assist Stand to Sit: 6: Modified independent (Device/Increase time);Without upper extremity assist    Exercises  General Exercises - Upper Extremity Shoulder Flexion: Strengthening;Right;20 reps;Theraband;Seated Theraband Level (Shoulder Flexion): Other (comment) (orange) Shoulder ABduction: Strengthening;Right;20 reps;Seated;Theraband Theraband Level (Shoulder Abduction): Other (comment) (orange) Shoulder Horizontal ABduction: Strengthening;Right;20 reps;Seated;Theraband Theraband Level (Shoulder Horizontal Abduction): Other (comment) (orange) Elbow Flexion: Strengthening;Right;10 reps;Seated;Theraband Theraband Level (Elbow Flexion): Other (comment) (orange) Elbow Extension: Strengthening;Right;10 reps;Seated;Other (comment) (orange)   Balance Balance Balance Assessed: Yes Dynamic Standing Balance Dynamic Standing - Balance Support: No upper extremity  supported Dynamic Standing - Level of Assistance: 4: Min assist High Level Balance High Level Balance Activites: Direction changes;Turns;Sudden stops;Head turns High Level Balance Comments: Pt with consistent LOB to the  right when attempting to stand or walk in tandem.   End of Session OT - End of Session Equipment Utilized During Treatment: Gait belt Activity Tolerance: Patient tolerated treatment well Patient left: in bed;with bed alarm set Nurse Communication: Mobility status     Amberlyn Martinezgarcia OTR/L Pager number [phone] 10/08/2012, 4:18 PM

## 2012-10-08 NOTE — Progress Notes (Signed)
[facility] PROGRESS NOTE  Manuel Harrison:[id number] DOB: June 27, 1971 DOA: 10/05/2012 PCP: No primary provider on file.  Brief narrative  42 year old male with history off heavy smoking and cocaine use presenting with acute onset of right upper extremity weakness and left facial droop with CT head showing acute left internal capsule infarct.   Assessment/Plan:  Acute left sided CVA  As evident on head CT . MRI Brain showing acute infarct involving left lentiform nucleus and corona radiate along with infarct over posterior left insular cortex and subcortical infarcts. Risk factors include heavy smoking, cocaine use and hyperlipidemia.  - Residual left facial droop and right upper regimen the weakness markedly improved...  -Continue full dose aspirin. Elevated LDL and triglycerides noted and started on Zocor 20 mg daily.  -Appreciate neurology evaluation and recommendation. Sent for hypercoagulable workup given his age and acute onset of symptoms.  -PT/OT eval.  -Carotid Doppler and 2-D echo unremarkable or any source of emboli.. hemoglobin A1c normal.  -Allow permissive hypertension.  -Counseled strongly on suggestion of smoking and substance abuse     Code Status: Full code  Family Communication: None at bedside  Disposition Plan: Will be reevaluated by inpatient rehabilitation on Monday . Given his progression of symptoms may be able to discharge with home health.    Consultants:  Neurology  Procedures:  None Antibiotics:  None   HPI/Subjective: No overnight issues. Right hand strength Continues to improve  Objective: Filed Vitals:   10/07/12 2200 10/08/12 0200 10/08/12 0530 10/08/12 1001  BP: 141/94 146/94 131/86 125/91  Pulse: 81 70 76 71  Temp: 98.7 F (37.1 C) 98.7 F (37.1 C) 98.4 F (36.9 C) 98.1 F (36.7 C)  TempSrc: Oral Oral Oral Oral  Resp: 20 20 20 20   Height:      Weight:      SpO2: 98% 97% 96% 97%    Intake/Output Summary (Last 24 hours)  at 10/08/12 1415 Last data filed at 10/08/12 0845  Gross per 24 hour  Intake    240 ml  Output      0 ml  Net    240 ml   Filed Weights   10/05/12 2239  Weight: 82.192 kg (181 lb 3.2 oz)    Exam:  General: Middle aged male in lying in bed in no acute distress  HEENT: No pallor, moist oral mucosa, minimal right facial droop. Speech normal  Cardiovascular: Normal S1 and S2, no murmurs rub or gallop,  Respiratory: Clear to auscultation bilaterally no added sounds  Abdomen: Soft, nontender, nondistended, bowel sounds present  Musculoskeletal: Warm, no edema  CNS: AAO x3, has some slurry speech, left facial droop, power over right hand 4+/5 and improved. . Sensation intact. Power normal in right lower extremity   Data Reviewed: Basic Metabolic Panel:  Recent Labs Lab 10/05/12 1819  NA 137  K 4.0  CL 101  CO2 21  GLUCOSE 116*  BUN 14  CREATININE 1.12  CALCIUM 10.2   Liver Function Tests:  Recent Labs Lab 10/05/12 1819  AST 21  ALT 17  ALKPHOS 59  BILITOT 0.2*  PROT 8.6*  ALBUMIN 4.2   No results found for this basename: LIPASE, AMYLASE,  in the last 168 hours No results found for this basename: AMMONIA,  in the last 168 hours CBC:  Recent Labs Lab 10/05/12 1819  WBC 5.8  NEUTROABS 3.0  HGB 16.0  HCT 43.2  MCV 88.2  PLT 277   Cardiac Enzymes: No results found  for this basename: CKTOTAL, CKMB, CKMBINDEX, TROPONINI,  in the last 168 hours BNP (last 3 results) No results found for this basename: PROBNP,  in the last 8760 hours CBG: No results found for this basename: GLUCAP,  in the last 168 hours  No results found for this or any previous visit (from the past 240 hour(s)).   Studies: No results found.  Scheduled Meds: . aspirin  324 mg Oral Daily  . enoxaparin (LOVENOX) injection  40 mg Subcutaneous Q24H  . nicotine  21 mg Transdermal Daily  . simvastatin  20 mg Oral q1800   Continuous Infusions:     Time spent: 25 minutes    Jasper Hanf,  Tobechukwu Emmick  [facility] Pager [phone]. If 7PM-7AM, please contact night-coverage at [email or web address], password [facility] 10/08/2012, 2:15 PM  LOS: 3 days

## 2012-10-08 NOTE — Progress Notes (Signed)
Patient has made good progress with therapy and assessed by OT today. Home health and 24 hour supervision is recommended at this time. Patient does not need IP rehab at this time. [phone]

## 2012-10-08 NOTE — Progress Notes (Signed)
Manuel Harrison has been asked repeatedly to call for assisting prior to ambulating which he will not do. He has on red non-skid socks and the bed alarm is on, however he still gets up unassisted. He upset about the bed alarm going off and it was explained why he needs to have it on and his high fall risk. His door has been left open for visibility for the staff, but he continues to close it and would not sit back in the bed because he knew that the alarm would be activated. I did make him aware that he was not confined to bed and that he could sit up in his chair if he would like but that we would still need to utilize that chair alarm. He was not very happy with this either. We will continue to monitor the patient for safety.

## 2012-10-09 LAB — CARDIOLIPIN ANTIBODIES, IGG, IGM, IGA
Anticardiolipin IgA: 5 APL U/mL — ABNORMAL LOW (ref <22)
Anticardiolipin IgG: 4 GPL U/mL — ABNORMAL LOW (ref <23)
Anticardiolipin IgM: 11 MPL U/mL — ABNORMAL HIGH (ref <11)

## 2012-10-09 LAB — BETA-2-GLYCOPROTEIN I ABS, IGG/M/A
Beta-2 Glyco I IgG: 0 G Units (ref <20)
Beta-2-Glycoprotein I IgA: 16 A Units (ref <20)
Beta-2-Glycoprotein I IgM: 39 M Units — ABNORMAL HIGH (ref <20)

## 2012-10-09 MED ORDER — ASPIRIN 81 MG PO CHEW: 324.0000 mg | CHEWABLE_TABLET | Freq: Every day | ORAL | Status: DC

## 2012-10-09 MED ORDER — ASPIRIN EC 325 MG PO TBEC: 325.0000 mg | DELAYED_RELEASE_TABLET | Freq: Every day | ORAL | Status: DC

## 2012-10-09 MED ORDER — NICOTINE 21 MG/24HR TD PT24: 1.0000 | MEDICATED_PATCH | Freq: Every day | TRANSDERMAL | Status: DC

## 2012-10-09 MED ORDER — SIMVASTATIN 20 MG PO TABS: 20.0000 mg | ORAL_TABLET | Freq: Every day | ORAL | Status: DC

## 2012-10-09 NOTE — Care Management Note (Signed)
    Page 1 of 2   10/09/2012     3:43:44 PM   CARE MANAGEMENT NOTE 10/09/2012  Patient:  Manuel Harrison, Manuel Harrison   Account Number:  [id number]  Date Initiated:  10/06/2012  Documentation initiated by:  [facility]  Subjective/Objective Assessment:   42 year old male admitted with acute CVA.     Action/Plan:   Lived with girlfriend PTA. PT/OT consults ordered to help determine d/c needs.   Anticipated DC Date:  10/09/2012   Anticipated DC Plan:  HOME W HOME HEALTH SERVICES  In-house referral  Financial Counselor      DC Planning Services  CM consult      [facility] Choice  HOME HEALTH   Choice offered to / List presented to:  C-1 Patient   DME arranged  NA      DME agency  NA     HH arranged  HH-1 RN  HH-2 PT  HH-3 OT  HH-6 SOCIAL WORKER  HH-5 SPEECH THERAPY      HH agency  [facility]   Status of service:  Completed, signed off Medicare Important Message given?  NA - LOS <3 / Initial given by admissions (If response is "NO", the following Medicare IM given date fields will be blank) Date Medicare IM given:   Date Additional Medicare IM given:    Discharge Disposition:  HOME W HOME HEALTH SERVICES  Per UR Regulation:  Reviewed for med. necessity/level of care/duration of stay  If discussed at Long Length of Stay Meetings, dates discussed:    Comments:  03/17/2014Linda Kathrynn Running BSN RN CCM [phone] Orders for [facility] services upon discharge. Pt states he plans to go bck to his home in [place] where girlfriend will be caregiver. No DME needs per therapy evaluation. [facility] rep requested to provide [facility] services(advised of medicaid potential). [facility] will be able to provide [facility] services. Physical home address and phone number verified and are correct. Pt for discharge today.

## 2012-10-09 NOTE — Progress Notes (Signed)
Physical Therapy Treatment Patient Details Name: Manuel Harrison MRN: [id number] DOB: 1971-07-16 Today's Date: 10/09/2012 Time: 4098-1191 PT Time Calculation (min): 10 min  PT Assessment / Plan / Recommendation Comments on Treatment Session  Pt is much improved, does not require a ssistive device. did inform pt to get a cane if unlevel surfaces were a concern. Pt. given written exercise program. Pt. is supposed to get HHPT. pt. is DC'd to home.    Follow Up Recommendations  Home health PT;Supervision - Intermittent     Does the patient have the potential to tolerate intense rehabilitation     Barriers to Discharge        Equipment Recommendations  None recommended by PT    Recommendations for Other Services    Frequency Min 4X/week   Plan Discharge plan remains appropriate;Frequency remains appropriate    Precautions / Restrictions Precautions Precautions: Fall   Pertinent Vitals/Pain     Mobility  Transfers Sit to Stand: 7: Independent Stand to Sit: 7: Independent Ambulation/Gait Ambulation/Gait Assistance: 7: Independent Ambulation Distance (Feet): 200 Feet Assistive device: None Ambulation/Gait Assistance Details: R leg appears with decreased  control during swing but clears floor during swing.. mild circumduction.  Gait Pattern: Step-through pattern    Exercises General Exercises - Lower Extremity Long Arc Quad: AROM Hip ABduction/ADduction: AROM Hip Flexion/Marching: AROM Other Exercises Other Exercises: Hip extension  Other Exercises: standing knee flexion.   PT Diagnosis:    PT Problem List:   PT Treatment Interventions:     PT Goals Acute Rehab PT Goals Pt will go Supine/Side to Sit: Independently PT Goal: Supine/Side to Sit - Progress: Met Pt will go Sit to Supine/Side: Independently PT Goal: Sit to Supine/Side - Progress: Met Pt will go Sit to Stand: Independently PT Goal: Sit to Stand - Progress: Met Pt will go Stand to Sit: Independently PT  Goal: Stand to Sit - Progress: Met Pt will Ambulate: >150 feet;with modified independence;with least restrictive assistive device PT Goal: Ambulate - Progress: Met  Visit Information  Last PT Received On: 10/09/12 Assistance Needed: +1    Subjective Data  Subjective: I am going home as soon as my ride comes.   Cognition  Cognition Overall Cognitive Status: Appears within functional limits for tasks assessed/performed Arousal/Alertness: Awake/alert Orientation Level: Appears intact for tasks assessed Behavior During Session: [facility] for tasks performed    Balance     End of Session PT - End of Session Activity Tolerance: Patient tolerated treatment well Patient left: in bed   GP     Rada Hay 10/09/2012, 11:42 AM [phone]

## 2012-10-09 NOTE — Discharge Summary (Addendum)
Physician Discharge Summary  Manuel Harrison ZOX:[id number] DOB: 06-24-1971 DOA: 10/05/2012  PCP: No primary provider on file.  Admit date: 10/05/2012 Discharge date: 10/09/2012  Time spent: 40  minutes  Recommendations for Outpatient Follow-up:  Home with HHOT/ PT scheduled for outpatient follow up at [facility] ( [facility])  On 3/25 at 12 pm. Needs follow up of hypercoagulable panel and BP monitoring.  follow up with Dr Pearlean Brownie in neurology clinic in 2 months  Discharge Diagnoses:  Principal Problem:   Acute ischemic stroke  Active Problems:   Tobacco abuse disorder   Cocaine use   Right sided weakness   Other and unspecified hyperlipidemia   Discharge Condition: fair  Diet recommendation: cardiac  Filed Weights   10/05/12 2239  Weight: 82.192 kg (181 lb 3.2 oz)    History of present illness:  Please refer to admission H&P for  details , but in brief, 42 year old male with history off heavy smoking and cocaine use presenting with acute onset of right upper extremity weakness and left facial droop with CT head showing acute left internal capsule infarct. utox positive for cocaine,  Hospital Course:    Acute left sided CVA  As evident on head CT . MRI Brain showing acute infarct involving left lentiform nucleus and corona radiate along with infarct over posterior left insular cortex and subcortical infarcts. Risk factors include heavy smoking, active cocaine use and hyperlipidemia.  - Residual left facial droop and right upper regimen the weakness present on admission markedly improved...  -Continue full dose aspirin. Elevated LDL and triglycerides noted and started on Zocor 20 mg daily.  -Appreciate neurology evaluation and recommendation. Sent for hypercoagulable workup given his age and acute onset of symptoms.  -Carotid Doppler and 2-D echo unremarkable or any source of emboli.. hemoglobin A1c normal.  -Allowing permissive hypertension.   -Counseled strongly on suggestion of smoking and substance abuse . Patient has shown marked improvement in  his symptoms and his strength has improved close to baseline. He is stable to be discharged home with HHPT/ OT and outpt follow up at adult health care center and neurology.    Procedures:  none  Consultations:  neurology  Discharge Exam: Filed Vitals:   10/08/12 1001 10/08/12 1507 10/08/12 2200 10/09/12 0600  BP: 125/91 130/86 135/82 119/83  Pulse: 71 65 61 76  Temp: 98.1 F (36.7 C) 98.3 F (36.8 C) 97.9 F (36.6 C) 97.8 F (36.6 C)  TempSrc: Oral Oral Oral Oral  Resp: 20 20 18 18   Height:      Weight:      SpO2: 97% 98% 100% 98%    General: Middle aged male in lying in bed in no acute distress  HEENT: No pallor, moist oral mucosa, minimal left  facial droop resolved. Speech normal  Cardiovascular: Normal S1 and S2, no murmurs rub or gallop,  Respiratory: Clear to auscultation bilaterally no added sounds  Abdomen: Soft, nontender, nondistended, bowel sounds present  Musculoskeletal: Warm, no edema  CNS: AAO x3,power over right hand 4+/5 and improved. . Sensation intact. Power normal in right lower extremity   Discharge Instructions     Medication List    TAKE these medications       aspirin 325 MG EC tablet  Take 1 tablet by mouth daily.     nicotine 21 mg/24hr patch  Commonly known as:  NICODERM CQ - dosed in mg/24 hours  Place 1 patch onto the skin daily.  simvastatin 20 MG tablet  Commonly known as:  ZOCOR  Take 1 tablet (20 mg total) by mouth daily at 6 PM.           Follow-up Information   Follow up with [facility] On 10/17/2012. (12 pm)    Contact information:   appt scheduled at adult healthcare clinic at [address].       Follow up with Gates Rigg, MD. Schedule an appointment as soon as possible for a visit in 2 months.   Contact information:   [address], [address] [facility] [place] [address] [phone]        The results of significant diagnostics from this hospitalization (including imaging, microbiology, ancillary and laboratory) are listed below for reference.    Significant Diagnostic Studies: Dg Chest 2 View  10/06/2012  *RADIOLOGY REPORT*  Clinical Data: Stroke  CHEST - 2 VIEW  Comparison: 06/26/2012  Findings: Normal heart size.  Stable bronchitic changes.  No peripheral consolidation or mass.  No pleural effusion.  No pneumothorax.  IMPRESSION: No active cardiopulmonary disease.   Original Report Authenticated By: Jolaine Click, M.D.    Ct Head Wo Contrast  10/05/2012  *RADIOLOGY REPORT*  Clinical Data: Ataxia,  aphasia, weakness  CT HEAD WITHOUT CONTRAST  Technique:  Contiguous axial images were obtained from the base of the skull through the vertex without contrast.  Comparison: T 01/20/2011  Findings: Ill-defined hypodensity in the left internal capsule and deep white matter, not present on the prior CT.  This is most likely due to acute infarct.  Negative for intracranial hemorrhage.  No mass lesion is present. No midline shift.  Ventricle size is normal.  IMPRESSION: Hypodensity left internal capsule and deep white matter on the left, consistent with acute infarct.  MRI and MRA of the head is suggested for further evaluation.   Original Report Authenticated By: Janeece Riggers, M.D.    Mr [facility] Wo Contrast  10/06/2012  **ADDENDUM** CREATED: 10/06/2012 08:32:18  Impression #1 should read:  Acute non hemorrhagic infarct involving the left lentiform nucleus and corona radiata.  **END ADDENDUM** SIGNED BY: Chauncey Fischer, M.D.   10/06/2012  *RADIOLOGY REPORT*  Clinical Data:  Stroke.  Right upper lower extremity weakness for 1 week.  Aphasia.  MRI HEAD WITHOUT CONTRAST MRA HEAD WITHOUT CONTRAST  Technique:  Multiplanar, multiecho pulse sequences of the brain and surrounding structures were obtained without intravenous contrast.  Angiographic images of the head were obtained using MRA technique without contrast.  Comparison:  CT head without contrast 10/05/2012.  MRI HEAD  Findings:  The diffusion weighted images confirm an acute non hemorrhagic infarct within the left lentiform nucleus ascending into the left corona radiata.  There is an additional focus in the posterior insular cortex and scattered subcortical infarcts.  No acute hemorrhage or mass lesion is associated.  The study is mildly degraded by patient motion.  Scattered subcortical T2 hyperintensities are present in addition to the areas of acute infarction.  T2 signal is associated with each of the areas of restricted diffusion.  The ventricles are normal size.  No significant extra-axial fluid collection is present.  The globes and orbits are intact.  The paranasal sinuses are clear. There is minimal fluid in the mastoid air cells bilaterally.  No obstructing nasopharyngeal lesion is evident.  IMPRESSION:  1.  No acute non hemorrhagic infarct involving the left lentiform nucleus and corona radiata. 2.  Additional focus of acute non hemorrhagic infarct  in the posterior left insular cortex. 3.  Additional foci of punctate subcortical infarcts are present. 4.  Mild white matter disease is advanced for age.  MRA HEAD  Findings: The internal carotid arteries are within normal limits from the high cervical segments through the ICA termini. Significant tortuosity of the cervical right ICA is evident.  The left M1 segment is occluded just beyond the ICA terminus.  The A1 segments are within normal limits bilaterally.  The right M1 segment is normal.  The anterior communicating artery is not clearly seen, but may be obscured by motion artifact.  The right MCA branch vessels demonstrate segmental narrowing without a significant proximal stenosis or occlusion.  The right vertebral artery is slightly dominant to the left.  The PICA origins are visualized and within normal limits  bilaterally. The basilar artery is normal.  Both posterior cerebral arteries originate from basilar tip.  There is some attenuation of distal PCA branch vessels.  IMPRESSION:  1.  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 2.  Mild to moderate small vessel disease is advanced for age.  The appearance is somewhat exaggerated by patient motion.  These results will be called to the ordering clinician or representative by the Radiologist Assistant, and communication documented in the PACS Dashboard.  Original Report Authenticated By: Marin Roberts, M.D.    Mr Brain Wo Contrast  10/06/2012  **ADDENDUM** CREATED: 10/06/2012 08:32:18  Impression #1 should read:  Acute non hemorrhagic infarct involving the left lentiform nucleus and corona radiata.  **END ADDENDUM** SIGNED BY: Chauncey Fischer, M.D.   10/06/2012  *RADIOLOGY REPORT*  Clinical Data:  Stroke.  Right upper lower extremity weakness for 1 week.  Aphasia.  MRI HEAD WITHOUT CONTRAST MRA HEAD WITHOUT CONTRAST  Technique:  Multiplanar, multiecho pulse sequences of the brain and surrounding structures were obtained without intravenous contrast. Angiographic images of the head were obtained using MRA technique without contrast.  Comparison:  CT head without contrast 10/05/2012.  MRI HEAD  Findings:  The diffusion weighted images confirm an acute non hemorrhagic infarct within the left lentiform nucleus ascending into the left corona radiata.  There is an additional focus in the posterior insular cortex and scattered subcortical infarcts.  No acute hemorrhage or mass lesion is associated.  The study is mildly degraded by patient motion.  Scattered subcortical T2 hyperintensities are present in addition to the areas of acute infarction.  T2 signal is associated with each of the areas of restricted diffusion.  The ventricles are normal size.  No significant extra-axial fluid collection is present.  The globes and  orbits are intact.  The paranasal sinuses are clear. There is minimal fluid in the mastoid air cells bilaterally.  No obstructing nasopharyngeal lesion is evident.  IMPRESSION:  1.  No acute non hemorrhagic infarct involving the left lentiform nucleus and corona radiata. 2.  Additional focus of acute non hemorrhagic infarct in the posterior left insular cortex. 3.  Additional foci of punctate subcortical infarcts are present. 4.  Mild white matter disease is advanced for age.  MRA HEAD  Findings: The internal carotid arteries are within normal limits from the high cervical segments through the ICA termini. Significant tortuosity of the cervical right ICA is evident.  The left M1 segment is occluded just beyond the ICA terminus.  The A1 segments are within normal limits bilaterally.  The right M1 segment is normal.  The anterior communicating artery is not clearly seen, but may  be obscured by motion artifact.  The right MCA branch vessels demonstrate segmental narrowing without a significant proximal stenosis or occlusion.  The right vertebral artery is slightly dominant to the left.  The PICA origins are visualized and within normal limits bilaterally. The basilar artery is normal.  Both posterior cerebral arteries originate from basilar tip.  There is some attenuation of distal PCA branch vessels.  IMPRESSION:  1.  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 2.  Mild to moderate small vessel disease is advanced for age.  The appearance is somewhat exaggerated by patient motion.  These results will be called to the ordering clinician or representative by the Radiologist Assistant, and communication documented in the PACS Dashboard.  Original Report Authenticated By: Marin Roberts, M.D.     Microbiology: No results found for this or any previous visit (from the past 240 hour(s)).   Labs: Basic Metabolic Panel:  Recent Labs Lab 10/05/12 1819  NA 137  K 4.0   CL 101  CO2 21  GLUCOSE 116*  BUN 14  CREATININE 1.12  CALCIUM 10.2   Liver Function Tests:  Recent Labs Lab 10/05/12 1819  AST 21  ALT 17  ALKPHOS 59  BILITOT 0.2*  PROT 8.6*  ALBUMIN 4.2   No results found for this basename: LIPASE, AMYLASE,  in the last 168 hours No results found for this basename: AMMONIA,  in the last 168 hours CBC:  Recent Labs Lab 10/05/12 1819  WBC 5.8  NEUTROABS 3.0  HGB 16.0  HCT 43.2  MCV 88.2  PLT 277   Cardiac Enzymes: No results found for this basename: CKTOTAL, CKMB, CKMBINDEX, TROPONINI,  in the last 168 hours BNP: BNP (last 3 results) No results found for this basename: PROBNP,  in the last 8760 hours CBG: No results found for this basename: GLUCAP,  in the last 168 hours     Signed:  Tanielle Emigh  [facility] 10/09/2012, 10:29 AM

## 2012-10-09 NOTE — Progress Notes (Signed)
Voices understanding of d/c instructions.  No changes noted since am assessment. Pt ambulated out with nurse tech at his side.

## 2012-10-10 LAB — PROTEIN C ACTIVITY: Protein C Activity: 183 % — ABNORMAL HIGH (ref 75–133)

## 2012-10-10 LAB — LUPUS ANTICOAGULANT PANEL
DRVVT: 44.2 secs — ABNORMAL HIGH (ref <42.9)
Lupus Anticoagulant: NOT DETECTED
PTT Lupus Anticoagulant: 49.4 secs — ABNORMAL HIGH (ref 28.0–43.0)
PTTLA 4:1 Mix: 44.1 secs — ABNORMAL HIGH (ref 28.0–43.0)
PTTLA Confirmation: 6.5 secs (ref <8.0)
dRVVT Incubated 1:1 Mix: 38.4 secs (ref <42.9)

## 2012-10-10 LAB — PROTEIN C, TOTAL: Protein C, Total: 140 % (ref 72–160)

## 2012-10-10 LAB — PROTEIN S, TOTAL: Protein S Ag, Total: 171 % — ABNORMAL HIGH (ref 60–150)

## 2012-10-10 LAB — PROTEIN S ACTIVITY: Protein S Activity: 125 % (ref 69–129)

## 2012-10-10 LAB — FACTOR 5 LEIDEN

## 2012-10-31 ENCOUNTER — Emergency Department (INDEPENDENT_AMBULATORY_CARE_PROVIDER_SITE_OTHER)
Admission: EM | Admit: 2012-10-31 | Discharge: 2012-10-31 | Disposition: A | Discharge status: Home / Self Care | Payer: Self-pay | Source: Home / Self Care | Attending: Family Medicine | Admitting: Family Medicine

## 2012-10-31 ENCOUNTER — Encounter (HOSPITAL_COMMUNITY): Payer: Self-pay

## 2012-10-31 DIAGNOSIS — F1490 Cocaine use, unspecified, uncomplicated: Secondary | ICD-10-CM

## 2012-10-31 DIAGNOSIS — Z720 Tobacco use: Secondary | ICD-10-CM

## 2012-10-31 DIAGNOSIS — I6350 Cerebral infarction due to unspecified occlusion or stenosis of unspecified cerebral artery: Secondary | ICD-10-CM

## 2012-10-31 DIAGNOSIS — I639 Cerebral infarction, unspecified: Secondary | ICD-10-CM

## 2012-10-31 DIAGNOSIS — E785 Hyperlipidemia, unspecified: Secondary | ICD-10-CM

## 2012-10-31 DIAGNOSIS — R531 Weakness: Secondary | ICD-10-CM

## 2012-10-31 HISTORY — DX: Cerebral infarction, unspecified: I63.9

## 2012-10-31 NOTE — ED Provider Notes (Signed)
History     CSN: [id number]  Arrival date & time 10/31/12  1621   First MD Initiated Contact with Patient 10/31/12 1745      Chief Complaint  Patient presents with  . Follow-up    (Consider location/radiation/quality/duration/timing/severity/associated sxs/prior treatment) HPI Pt reports that he is feeling better and his speech is much better now.  He is not using any more cocaine.  He is still smoking but trying to quit.  Pt is taking his aspirin and taking his zocor and tolerating it well.  No complaints.  No falls at home.    Past Medical History  Diagnosis Date  . Stroke        Hyperlipidemia       History of multiple substance abuse   History reviewed. No pertinent past surgical history.  No family history on file.  History  Substance Use Topics  . Smoking status: Current Every Day Smoker -- 2.00 packs/day for 27 years    Types: Cigarettes  . Smokeless tobacco: Not on file  . Alcohol Use: Yes     Comment: 128 oz beer daily      Review of Systems Constitutional: Negative.  HENT: Negative.  Respiratory: Negative.  Cardiovascular: Negative.  Gastrointestinal: Negative.  Endocrine: Negative.  Genitourinary: Negative.  Musculoskeletal: Negative.  Skin: Negative.  Allergic/Immunologic: Negative.  Neurological: Negative.  Hematological: Negative.  Psychiatric/Behavioral: Negative.  All other systems reviewed and are negative   Allergies  Review of patient's allergies indicates no known allergies.  Home Medications   Current Outpatient Rx  Name  Route  Sig  Dispense  Refill  . aspirin EC 325 MG tablet   Oral   Take 1 tablet (325 mg total) by mouth daily.   30 tablet   3   . nicotine (NICODERM CQ - DOSED IN MG/24 HOURS) 21 mg/24hr patch   Transdermal   Place 1 patch onto the skin daily.   28 patch   0   . simvastatin (ZOCOR) 20 MG tablet   Oral   Take 1 tablet (20 mg total) by mouth daily at 6 PM.   30 tablet   3     BP 117/85  Pulse 72   Temp(Src) 98.1 F (36.7 C) (Oral)  Resp 17  SpO2 99%  Physical Exam Nursing note and vitals reviewed.  Constitutional: He is oriented to person, place, and time. He appears well-developed and well-nourished. No distress.  Eyes: Conjunctivae and EOM are normal. Pupils are equal, round, and reactive to light.  Neck: Normal range of motion. Neck supple. No JVD present. No thyromegaly present.  Cardiovascular: Normal rate, regular rhythm and normal heart sounds.  No murmur heard.  Pulmonary/Chest: Effort normal and breath sounds normal. No respiratory distress.  Abdominal: Soft. Bowel sounds are normal.  Musculoskeletal: Normal range of motion. He exhibits no edema.  Lymphadenopathy:  He has no cervical adenopathy.  Neurological: He is oriented to person, place, and time. Coordination normal.  Skin: Skin is warm and dry. No rash noted. No erythema. No pallor.  Psychiatric: He has a normal mood and affect. His behavior is normal. Judgment and thought content normal.      ED Course  Procedures (including critical care time)  Labs Reviewed - No data to display No results found.   No diagnosis found.    MDM  IMPRESSION  S/p CVA  Hyperlipidemia  Tobacco  Polysubstance abuse   RECOMMENDATIONS / PLAN Follow up with Neurology as scheduled. Encouraged cessation of recreational  drugs The patient was counseled on the dangers of tobacco use, and was advised to quit.  Reviewed strategies to maximize success, including removing cigarettes and smoking materials from environment, stress management and written materials. The patient verbalized understanding.  Encouraged walking 30 mins 5 times per week. STOP SMOKING  FOLLOW UP 3 months for recheck   The patient was given clear instructions to go to ER or return to medical center if symptoms don't improve, worsen or new problems develop.  The patient verbalized understanding.  The patient was told to call to get lab results if they  haven't heard anything in the next week.            Cleora Fleet, MD 10/31/12 1755

## 2012-10-31 NOTE — ED Notes (Signed)
Hospital follow up-stroke 

## 2012-12-22 ENCOUNTER — Telehealth: Payer: Self-pay | Admitting: Neurology

## 2012-12-22 NOTE — Telephone Encounter (Signed)
I spoke to [facility] after speaking with Dr. Pearlean Brownie, and appt for pt can be done as a bubble study.  Manuel Harrison in vascular lab will be calling after June 9th to schedule.  She verbalized understanding.

## 2013-01-05 ENCOUNTER — Other Ambulatory Visit: Payer: Self-pay | Admitting: Neurology

## 2013-01-05 DIAGNOSIS — I6350 Cerebral infarction due to unspecified occlusion or stenosis of unspecified cerebral artery: Secondary | ICD-10-CM

## 2013-01-30 ENCOUNTER — Encounter: Payer: Self-pay | Admitting: Family Medicine

## 2013-01-30 ENCOUNTER — Ambulatory Visit: Payer: Self-pay | Attending: Family Medicine | Admitting: Family Medicine

## 2013-01-30 VITALS — BP 119/71 | HR 74 | Temp 98.7°F | Resp 16 | Ht 68.0 in | Wt 186.4 lb

## 2013-01-30 DIAGNOSIS — Z09 Encounter for follow-up examination after completed treatment for conditions other than malignant neoplasm: Secondary | ICD-10-CM | POA: Insufficient documentation

## 2013-01-30 DIAGNOSIS — Z8673 Personal history of transient ischemic attack (TIA), and cerebral infarction without residual deficits: Secondary | ICD-10-CM | POA: Insufficient documentation

## 2013-01-30 DIAGNOSIS — R29898 Other symptoms and signs involving the musculoskeletal system: Secondary | ICD-10-CM | POA: Insufficient documentation

## 2013-01-30 DIAGNOSIS — F17200 Nicotine dependence, unspecified, uncomplicated: Secondary | ICD-10-CM | POA: Insufficient documentation

## 2013-01-30 DIAGNOSIS — E785 Hyperlipidemia, unspecified: Secondary | ICD-10-CM | POA: Insufficient documentation

## 2013-01-30 DIAGNOSIS — I679 Cerebrovascular disease, unspecified: Secondary | ICD-10-CM

## 2013-01-30 LAB — LIPID PANEL
Cholesterol: 204 mg/dL — ABNORMAL HIGH (ref 0–200)
HDL: 31 mg/dL — ABNORMAL LOW (ref >39)
LDL Cholesterol: 97 mg/dL (ref 0–99)
Total CHOL/HDL Ratio: 6.6 Ratio
Triglycerides: 382 mg/dL — ABNORMAL HIGH (ref <150)
VLDL: 76 mg/dL — ABNORMAL HIGH (ref 0–40)

## 2013-01-30 MED ORDER — SIMVASTATIN 20 MG PO TABS: 20.0000 mg | ORAL_TABLET | Freq: Every day | ORAL | Status: DC

## 2013-01-30 NOTE — Progress Notes (Signed)
Pt here s/p stroke with right arm/leg weakness. Here for hyperlipidemia/medication management . Denies pain. vss

## 2013-01-30 NOTE — Progress Notes (Signed)
Patient ID: Manuel Harrison, male   DOB: 1971-03-08, 42 y.o.   MRN: [id number]  CC: follow up   HPI: Patient reports that he is taking the simvastatin medication regularly.  He reports that he has not been able to stop smoking cigarettes.  He reports that he has been able to abstain from recreational drug use.  He also reports no alcohol use.  The patient reports that he still has some weakness in his right upper extremity.   No Known Allergies Past Medical History  Diagnosis Date  . Stroke    Current Outpatient Prescriptions on File Prior to Visit  Medication Sig Dispense Refill  . aspirin EC 325 MG tablet Take 1 tablet (325 mg total) by mouth daily.  30 tablet  3   No current facility-administered medications on file prior to visit.   History reviewed. No pertinent family history. History   Social History  . Marital Status: Single    Spouse Name: N/A    Number of Children: N/A  . Years of Education: N/A   Occupational History  . Not on file.   Social History Main Topics  . Smoking status: Current Every Day Smoker -- 2.00 packs/day for 27 years    Types: Cigarettes  . Smokeless tobacco: Not on file  . Alcohol Use: Yes     Comment: 128 oz beer daily  . Drug Use: Yes    Special: Marijuana  . Sexually Active: Not on file   Other Topics Concern  . Not on file   Social History Narrative  . No narrative on file    Review of Systems  Constitutional: Negative for fever, chills, diaphoresis, activity change, appetite change and fatigue.  HENT: Negative for ear pain, nosebleeds, congestion, facial swelling, rhinorrhea, neck pain, neck stiffness and ear discharge.   Eyes: Negative for pain, discharge, redness, itching and visual disturbance.  Respiratory: Negative for cough, choking, chest tightness, shortness of breath, wheezing and stridor.   Cardiovascular: Negative for chest pain, palpitations and leg swelling.  Gastrointestinal: Negative for abdominal distention.   Genitourinary: Negative for dysuria, urgency, frequency, hematuria, flank pain, decreased urine volume, difficulty urinating and dyspareunia.  Musculoskeletal: strength weakness in the right upper extremity  Neurological: Negative for dizziness, tremors, seizures, syncope, facial asymmetry, speech difficulty, weakness, light-headedness, numbness and headaches.  Hematological: Negative for adenopathy. Does not bruise/bleed easily.  Psychiatric/Behavioral: Negative for hallucinations, behavioral problems, confusion, dysphoric mood, decreased concentration and agitation.    Objective:   Filed Vitals:   01/30/13 1559  BP: 119/71  Pulse: 74  Temp: 98.7 F (37.1 C)  Resp: 16    Physical Exam  Constitutional: Appears well-developed and well-nourished. No distress.  HENT: Normocephalic. External right and left ear normal. Oropharynx is clear and moist.  Eyes: Conjunctivae and EOM are normal. PERRLA, no scleral icterus.  Neck: Normal ROM. Neck supple. No JVD. No tracheal deviation. No thyromegaly.  CVS: RRR, S1/S2 +, no murmurs, no gallops, no carotid bruit.  Pulmonary: Effort and breath sounds normal, no stridor, rhonchi, wheezes, rales.  Abdominal: Soft. BS +,  no distension, tenderness, rebound or guarding.  Musculoskeletal: Normal range of motion. No edema and no tenderness.  Lymphadenopathy: No lymphadenopathy noted, cervical, inguinal. Neuro: Alert. Normal reflexes, muscle tone coordination. No cranial nerve ImmediateScreening.se strength deficit in the right upper extremity 4/5. Skin: Skin is warm and dry. No rash noted. Not diaphoretic. No erythema. No pallor.  Psychiatric: Normal mood and affect. Behavior, judgment, thought content normal.  Lab Results  Component Value Date   WBC 5.8 10/05/2012   HGB 16.0 10/05/2012   HCT 43.2 10/05/2012   MCV 88.2 10/05/2012   PLT 277 10/05/2012   Lab Results  Component Value Date   CREATININE 1.12 10/05/2012   BUN 14 10/05/2012   NA 137 10/05/2012    K 4.0 10/05/2012   CL 101 10/05/2012   CO2 21 10/05/2012    Lab Results  Component Value Date   HGBA1C 5.5 10/06/2012   Lipid Panel     Component Value Date/Time   CHOL 200 10/06/2012 0504   TRIG 227* 10/06/2012 0504   HDL 27* 10/06/2012 0504   CHOLHDL 7.4 10/06/2012 0504   VLDL 45* 10/06/2012 0504   LDLCALC 128* 10/06/2012 0504     Assessment and plan:   Patient Active Problem List   Diagnosis Date Noted  . Acute ischemic stroke 10/07/2012  . Tobacco abuse disorder 10/06/2012  . Cocaine use 10/06/2012  . Other and unspecified hyperlipidemia 10/06/2012  . Right sided weakness 10/05/2012    Check lipids today   Refilled simvastatin 20 mg by mouth daily  The patient is scheduled to followup with his neurologist next week  The patient was counseled on the dangers of tobacco use, and was advised to quit and referred to a tobacco cessation program.  Reviewed strategies to maximize success, including removing cigarettes and smoking materials from environment, substitution of other forms of reinforcement and support of family/friends.  The patient was given clear instructions to go to ER or return to medical center if symptoms don't improve, worsen or new problems develop.  The patient verbalized understanding.  The patient was told to call to get any lab results if not heard anything in the next week.    RTC in 4 months  Rodney Langton, MD, CDE, FAAFP [facility] [place], [place]

## 2013-01-30 NOTE — Patient Instructions (Signed)
Stroke Prevention Some health problems and behaviors may make it more likely for you to have a stroke. Below are ways to lessen your risk of having a stroke.   Be active for at least 30 minutes on most or all days.  Do not smoke. Try not to be around others who smoke (secondhand smoke).  Do not drink too much alcohol.  Eat healthy foods, such as fruits and vegetables. If you were put on a specific diet, follow the diet as told.  Keep your cholesterol levels under control through diet and medicines. Look for foods that are low in saturated fat, trans fat, cholesterol, and are high in fiber.  If you have diabetes, follow all diet plans and take your medicine as told.  If you have high blood pressure (hypertension), follow all diet plans and take your medicine as told.  Keep a healthy weight. Eat foods that are low in calories, salt, saturated fat, trans fat, and cholesterol.  Do not take drugs.  Avoid birth control pills, if this applies. Talk to your doctor about the risks of taking birth control pills.  Talk to your doctor if you have sleep problems (sleep apnea).  Take all medicine as told by your doctor.  You may be told to take aspirin or blood thinner medicine. Take this medicine as told.  Understand your medicine instructions. GET HELP RIGHT AWAY IF:  You lose feeling (numbness) or have weakness in the face, arm, or leg.  You lose feeling or have weakness on one side of the body.  You feel suddenly confused.  You have trouble talking or understanding what people are saying.  You have sudden trouble seeing in one or both eyes.  You have sudden trouble walking.  You are dizzy.  You lose your balance or your movements are clumsy (uncoordinated).  You suddenly have a very bad headache, and you do not know the cause.  You have new chest pain.  Your heart feels like it is fluttering or skipping a beat (irregular heartbeat). Do not wait to see if the symptoms above  go away. Get help right away. Call your local emergency services (911 in [place] Do not drive yourself to the hospital. Document Released: 01/11/2012 Document Reviewed: 01/11/2012 [facility] Patient Information 2014 [place], [place]. Therapy after a Stroke, Adult Your brain cells need a steady supply of blood and oxygen to work normally. A stroke comes from a sudden end of blood flow to the brain. Your brain cells may start to die within minutes after blood flow is interrupted. The loss of brain function can cause problems with speech, vision, memory, or movement. The severity of the stroke depends on which part of your brain is affected and how big an area is harmed. There are two main types of stroke:  Ischemic stroke is caused by a blockage in arteries (blood vessels carrying oxygen rich blood) supplying the brain. This is the most common kind of stroke.  Hemorrhagic stroke is occurs when a blood vessel in your brain bursts and causes bleeding into your brain. A stroke can cause many types of problems. The treatment of stroke involves 3 stages. The 3 stages are prevention, treatment immediately following a stroke, and rehabilitation after a stroke. BEFORE THERAPY BEGINS A detailed exam by your caregiver helps outline what problems were caused by the stroke. Your caregiver may ask specialists about this. The specialists may include doctors, occupational therapists, physical therapists, and speech therapists. It is then possible to make a  plan that best fits your needs. Your evaluation might include the following:  Your ability to do daily activities that require using muscles, coordination, vision, reasoning, memory, and problem solving. Interviews with you and your caregiver to understand what you could do and could not do before the stroke. Your ability to do personal self-care tasks, such as dressing, grooming, and eating.  Tests may be done to see if there are sensory and motor changes due to the  stroke, especially in the hands and legs. TREATMENT   Your caregiver may start therapy right away depending on the type and severity of your stroke.  Medicines.  If a blood clot caused the stroke, you may be started on medicines that can help to dissolve the clot and stop new clots from forming.  If bleeding caused the stroke, medicines might be used to lower blood pressure and reverse the effects of any blood thinners used at the time of the stroke.  Therapy after stroke is focused on getting function back and preventing another stroke. Therapy might include:  Physical therapy. This can include help with walking, sitting, lying down, and balance. It may also be designed to help prevent shortening of the muscles (contractures) and swelling (edema).  Occupational therapy. This therapy helps you to relearn skills needed for leading a normal life. These could include eating, using the restroom, dressing, and taking care of yourself. It helps to make you more independent.  Vision therapy. This can help you to retrain, strengthen, and improve your vision following a stroke.  Speech therapy. This can help to improve your speech and communication skills. It also teaches both you and your family members to cope with problems of being unable to communicate.  Cognitive therapy. This therapy can help with problems caused by lack of memory, attention, or concentration.  Psychological or psychiatric therapy. This can help you cope with problems of frustration and emotional problems that may develop after a stroke.  Blood pressure control. If your blood pressure is high, it is important to lower it to a normal pressure.  Smoking cessation. If you smoke, it is important to get help to stop smoking.  Medicines to lower cholesterol (antilipidemics) are sometimes prescribed to reduce the amount of fat (plaque) in the blood. The reduction of plaque on artery walls can decrease the risk of a future  stroke. HOME CARE INSTRUCTIONS   Follow a healthy diet as directed by your caregiver.  Monitor and control your blood sugar levels, if you are a person with diabetes.  Reduce the amount of sodium and fat in your diet.  Quit smoking.  Exercise regularly, in moderation.  Take the necessary prescribed medicines as directed by your caregiver.  Continue your rehabilitation program as directed by your caregiver.  Get your blood pressure and cholesterol levels checked regularly. SEEK MEDICAL CARE IF:   You feel you are having problems with medicines prescribed.  You are not improving as expected. SEEK IMMEDIATE MEDICAL CARE IF:   You have sudden weakness or numbness of the face, arm, or leg, especially on one side of the body.  You have sudden confusion.  You have trouble speaking (aphasia) or understanding.  You have sudden trouble seeing in 1 or both eyes.  You have sudden trouble walking.  You have dizziness.  You have a loss of balance or coordination.  You have a sudden severe headache with no known cause.  You have an oral temperature above 102 F (38.9 C), not controlled by  medicine.  You are coughing or have difficulty breathing.  You have new chest pain, angina, or an irregular heartbeat. ANY OF THESE SYMPTOMS MAY REPRESENT A SERIOUS PROBLEM THAT IS AN EMERGENCY. Do not wait to see if the symptoms will go away. Get medical help at once. Call your local emergency services (911 in the [place] DO NOT drive yourself to the hospital. Document Released: 08/01/2007 Document Revised: 10/04/2011 Document Reviewed: 08/01/2007 [facility] Patient Information 2014 [place], [place]. Smoking Cessation, Tips for Success YOU CAN QUIT SMOKING If you are ready to quit smoking, congratulations! You have chosen to help yourself be healthier. Cigarettes bring nicotine, tar, carbon monoxide, and other irritants into your body. Your lungs, heart, and blood vessels will be able to work  better without these poisons. There are many different ways to quit smoking. Nicotine gum, nicotine patches, a nicotine inhaler, or nicotine nasal spray can help with physical craving. Hypnosis, support groups, and medicines help break the habit of smoking. Here are some tips to help you quit for good.  Throw away all cigarettes.  Clean and remove all ashtrays from your home, work, and car.  On a card, write down your reasons for quitting. Carry the card with you and read it when you get the urge to smoke.  Cleanse your body of nicotine. Drink enough water and fluids to keep your urine clear or pale yellow. Do this after quitting to flush the nicotine from your body.  Learn to predict your moods. Do not let a bad situation be your excuse to have a cigarette. Some situations in your life might tempt you into wanting a cigarette.  Never have "just one" cigarette. It leads to wanting another and another. Remind yourself of your decision to quit.  Change habits associated with smoking. If you smoked while driving or when feeling stressed, try other activities to replace smoking. Stand up when drinking your coffee. Brush your teeth after eating. Sit in a different chair when you read the paper. Avoid alcohol while trying to quit, and try to drink fewer caffeinated beverages. Alcohol and caffeine may urge you to smoke.  Avoid foods and drinks that can trigger a desire to smoke, such as sugary or spicy foods and alcohol.  Ask people who smoke not to smoke around you.  Have something planned to do right after eating or having a cup of coffee. Take a walk or exercise to perk you up. This will help to keep you from overeating.  Try a relaxation exercise to calm you down and decrease your stress. Remember, you may be tense and nervous for the first 2 weeks after you quit, but this will pass.  Find new activities to keep your hands busy. Play with a pen, coin, or rubber band. Doodle or draw things on  paper.  Brush your teeth right after eating. This will help cut down on the craving for the taste of tobacco after meals. You can try mouthwash, too.  Use oral substitutes, such as lemon drops, carrots, a cinnamon stick, or chewing gum, in place of cigarettes. Keep them handy so they are available when you have the urge to smoke.  When you have the urge to smoke, try deep breathing.  Designate your home as a nonsmoking area.  If you are a heavy smoker, ask your caregiver about a prescription for nicotine chewing gum. It can ease your withdrawal from nicotine.  Reward yourself. Set aside the cigarette money you save and buy yourself something nice.  Look for support from others. Join a support group or smoking cessation program. Ask someone at home or at work to help you with your plan to quit smoking.  Always ask yourself, "Do I need this cigarette or is this just a reflex?" Tell yourself, "Today, I choose not to smoke," or "I do not want to smoke." You are reminding yourself of your decision to quit, even if you do smoke a cigarette. HOW WILL I FEEL WHEN I QUIT SMOKING?  The benefits of not smoking start within days of quitting.  You may have symptoms of withdrawal because your body is used to nicotine (the addictive substance in cigarettes). You may crave cigarettes, be irritable, feel very hungry, cough often, get headaches, or have difficulty concentrating.  The withdrawal symptoms are only temporary. They are strongest when you first quit but will go away within 10 to 14 days.  When withdrawal symptoms occur, stay in control. Think about your reasons for quitting. Remind yourself that these are signs that your body is healing and getting used to being without cigarettes.  Remember that withdrawal symptoms are easier to treat than the major diseases that smoking can cause.  Even after the withdrawal is over, expect periodic urges to smoke. However, these cravings are generally  short-lived and will go away whether you smoke or not. Do not smoke!  If you relapse and smoke again, do not lose hope. Most smokers quit 3 times before they are successful.  If you relapse, do not give up! Plan ahead and think about what you will do the next time you get the urge to smoke. LIFE AS A NONSMOKER: MAKE IT FOR A MONTH, MAKE IT FOR LIFE Day 1: Hang this page where you will see it every day. Day 2: Get rid of all ashtrays, matches, and lighters. Day 3: Drink water. Breathe deeply between sips. Day 4: Avoid places with smoke-filled air, such as bars, clubs, or the smoking section of restaurants. Day 5: Keep track of how much money you save by not smoking. Day 6: Avoid boredom. Keep a good book with you or go to the movies. Day 7: Reward yourself! One week without smoking! Day 8: Make a dental appointment to get your teeth cleaned. Day 9: Decide how you will turn down a cigarette before it is offered to you. Day 10: Review your reasons for quitting. Day 11: Distract yourself. Stay active to keep your mind off smoking and to relieve tension. Take a walk, exercise, read a book, do a crossword puzzle, or try a new hobby. Day 12: Exercise. Get off the bus before your stop or use stairs instead of escalators. Day 13: Call on friends for support and encouragement. Day 14: Reward yourself! Two weeks without smoking! Day 15: Practice deep breathing exercises. Day 16: Bet a friend that you can stay a nonsmoker. Day 17: Ask to sit in nonsmoking sections of restaurants. Day 18: Hang up "No Smoking" signs. Day 19: Think of yourself as a nonsmoker. Day 20: Each morning, tell yourself you will not smoke. Day 21: Reward yourself! Three weeks without smoking! Day 22: Think of smoking in negative ways. Remember how it stains your teeth, gives you bad breath, and leaves you short of breath. Day 23: Eat a nutritious breakfast. Day 24:Do not relive your days as a smoker. Day 25: Hold a pencil in  your hand when talking on the telephone. Day 26: Tell all your friends you do not smoke. Day 27: Think  about how much better food tastes. Day 28: Remember, one cigarette is one too many. Day 29: Take up a hobby that will keep your hands busy. Day 30: Congratulations! One month without smoking! Give yourself a big reward. Your caregiver can direct you to community resources or hospitals for support, which may include:  Group support.  Education.  Hypnosis.  Subliminal therapy. Document Released: 04/09/2004 Document Revised: 10/04/2011 Document Reviewed: 04/28/2009 [facility] Patient Information 2014 [place], [place]. Smoking Cessation Quitting smoking is important to your health and has many advantages. However, it is not always easy to quit since nicotine is a very addictive drug. Often times, people try 3 times or more before being able to quit. This document explains the best ways for you to prepare to quit smoking. Quitting takes hard work and a lot of effort, but you can do it. ADVANTAGES OF QUITTING SMOKING  You will live longer, feel better, and live better.  Your body will feel the impact of quitting smoking almost immediately.  Within 20 minutes, blood pressure decreases. Your pulse returns to its normal level.  After 8 hours, carbon monoxide levels in the blood return to normal. Your oxygen level increases.  After 24 hours, the chance of having a heart attack starts to decrease. Your breath, hair, and body stop smelling like smoke.  After 48 hours, damaged nerve endings begin to recover. Your sense of taste and smell improve.  After 72 hours, the body is virtually free of nicotine. Your bronchial tubes relax and breathing becomes easier.  After 2 to 12 weeks, lungs can hold more air. Exercise becomes easier and circulation improves.  The risk of having a heart attack, stroke, cancer, or lung disease is greatly reduced.  After 1 year, the risk of coronary heart disease is  cut in half.  After 5 years, the risk of stroke falls to the same as a nonsmoker.  After 10 years, the risk of lung cancer is cut in half and the risk of other cancers decreases significantly.  After 15 years, the risk of coronary heart disease drops, usually to the level of a nonsmoker.  If you are pregnant, quitting smoking will improve your chances of having a healthy baby.  The people you live with, especially any children, will be healthier.  You will have extra money to spend on things other than cigarettes. QUESTIONS TO THINK ABOUT BEFORE ATTEMPTING TO QUIT You may want to talk about your answers with your caregiver.  Why do you want to quit?  If you tried to quit in the past, what helped and what did not?  What will be the most difficult situations for you after you quit? How will you plan to handle them?  Who can help you through the tough times? Your family? Friends? A caregiver?  What pleasures do you get from smoking? What ways can you still get pleasure if you quit? Here are some questions to ask your caregiver:  How can you help me to be successful at quitting?  What medicine do you think would be best for me and how should I take it?  What should I do if I need more help?  What is smoking withdrawal like? How can I get information on withdrawal? GET READY  Set a quit date.  Change your environment by getting rid of all cigarettes, ashtrays, matches, and lighters in your home, car, or work. Do not let people smoke in your home.  Review your past attempts to quit.  Think about what worked and what did not. GET SUPPORT AND ENCOURAGEMENT You have a better chance of being successful if you have help. You can get support in many ways.  Tell your family, friends, and co-workers that you are going to quit and need their support. Ask them not to smoke around you.  Get individual, group, or telephone counseling and support. Programs are available at [employer]  and health centers. Call your local health department for information about programs in your area.  Spiritual beliefs and practices may help some smokers quit.  Download a "quit meter" on your computer to keep track of quit statistics, such as how long you have gone without smoking, cigarettes not smoked, and money saved.  Get a self-help book about quitting smoking and staying off of tobacco. LEARN NEW SKILLS AND BEHAVIORS  Distract yourself from urges to smoke. Talk to someone, go for a walk, or occupy your time with a task.  Change your normal routine. Take a different route to work. Drink tea instead of coffee. Eat breakfast in a different place.  Reduce your stress. Take a hot bath, exercise, or read a book.  Plan something enjoyable to do every day. Reward yourself for not smoking.  Explore interactive web-based programs that specialize in helping you quit. GET MEDICINE AND USE IT CORRECTLY Medicines can help you stop smoking and decrease the urge to smoke. Combining medicine with the above behavioral methods and support can greatly increase your chances of successfully quitting smoking.  Nicotine replacement therapy helps deliver nicotine to your body without the negative effects and risks of smoking. Nicotine replacement therapy includes nicotine gum, lozenges, inhalers, nasal sprays, and skin patches. Some may be available over-the-counter and others require a prescription.  Antidepressant medicine helps people abstain from smoking, but how this works is unknown. This medicine is available by prescription.  Nicotinic receptor partial agonist medicine simulates the effect of nicotine in your brain. This medicine is available by prescription. Ask your caregiver for advice about which medicines to use and how to use them based on your health history. Your caregiver will tell you what side effects to look out for if you choose to be on a medicine or therapy. Carefully read the  information on the package. Do not use any other product containing nicotine while using a nicotine replacement product.  RELAPSE OR DIFFICULT SITUATIONS Most relapses occur within the first 3 months after quitting. Do not be discouraged if you start smoking again. Remember, most people try several times before finally quitting. You may have symptoms of withdrawal because your body is used to nicotine. You may crave cigarettes, be irritable, feel very hungry, cough often, get headaches, or have difficulty concentrating. The withdrawal symptoms are only temporary. They are strongest when you first quit, but they will go away within 10 14 days. To reduce the chances of relapse, try to:  Avoid drinking alcohol. Drinking lowers your chances of successfully quitting.  Reduce the amount of caffeine you consume. Once you quit smoking, the amount of caffeine in your body increases and can give you symptoms, such as a rapid heartbeat, sweating, and anxiety.  Avoid smokers because they can make you want to smoke.  Do not let weight gain distract you. Many smokers will gain weight when they quit, usually less than 10 pounds. Eat a healthy diet and stay active. You can always lose the weight gained after you quit.  Find ways to improve your mood other than smoking.  FOR MORE INFORMATION  www.smokefree.gov  Document Released: 07/06/2001 Document Revised: 01/11/2012 Document Reviewed: 10/21/2011 [facility] Patient Information 2014 [place], [place].

## 2013-01-31 ENCOUNTER — Telehealth: Payer: Self-pay

## 2013-01-31 NOTE — Telephone Encounter (Signed)
Patient is aware of lab results.

## 2013-01-31 NOTE — Progress Notes (Signed)
Quick Note:  Please inform patient that his LDL cholesterol level is improving. Continue medications. We need to get a fasting lipid panel in about 3 months.    Rodney Langton, MD, CDE, FAAFP [facility] [place], [place]   ______

## 2013-01-31 NOTE — Telephone Encounter (Signed)
Message copied by Lestine Mount on Wed Jan 31, 2013 12:21 PM ------      Message from: Cleora Fleet      Created: Wed Jan 31, 2013  8:59 AM       Please inform patient that his LDL cholesterol level is improving.  Continue medications.  We need to get a fasting lipid panel in about 3 months.                     Rodney Langton, MD, CDE, FAAFP      [facility]      [facility]      [place], [place]        ------

## 2013-02-09 ENCOUNTER — Other Ambulatory Visit: Payer: Self-pay

## 2013-02-09 ENCOUNTER — Ambulatory Visit: Payer: Self-pay | Admitting: Neurology

## 2013-09-18 ENCOUNTER — Ambulatory Visit: Payer: Self-pay | Attending: Internal Medicine | Admitting: Internal Medicine

## 2013-09-18 ENCOUNTER — Encounter: Payer: Self-pay | Admitting: Internal Medicine

## 2013-09-18 VITALS — BP 137/89 | HR 96 | Temp 98.5°F | Resp 18 | Ht 68.0 in | Wt 185.0 lb

## 2013-09-18 DIAGNOSIS — I679 Cerebrovascular disease, unspecified: Secondary | ICD-10-CM

## 2013-09-18 DIAGNOSIS — F1210 Cannabis abuse, uncomplicated: Secondary | ICD-10-CM | POA: Insufficient documentation

## 2013-09-18 DIAGNOSIS — Z8673 Personal history of transient ischemic attack (TIA), and cerebral infarction without residual deficits: Secondary | ICD-10-CM | POA: Insufficient documentation

## 2013-09-18 DIAGNOSIS — F17200 Nicotine dependence, unspecified, uncomplicated: Secondary | ICD-10-CM | POA: Insufficient documentation

## 2013-09-18 DIAGNOSIS — Z09 Encounter for follow-up examination after completed treatment for conditions other than malignant neoplasm: Secondary | ICD-10-CM | POA: Insufficient documentation

## 2013-09-18 DIAGNOSIS — F1410 Cocaine abuse, uncomplicated: Secondary | ICD-10-CM | POA: Insufficient documentation

## 2013-09-18 DIAGNOSIS — E785 Hyperlipidemia, unspecified: Secondary | ICD-10-CM | POA: Insufficient documentation

## 2013-09-18 LAB — COMPLETE METABOLIC PANEL WITH GFR
ALT: 14 U/L (ref 0–53)
AST: 17 U/L (ref 0–37)
Albumin: 4.5 g/dL (ref 3.5–5.2)
Alkaline Phosphatase: 55 U/L (ref 39–117)
BUN: 13 mg/dL (ref 6–23)
CO2: 24 mEq/L (ref 19–32)
Calcium: 9.8 mg/dL (ref 8.4–10.5)
Chloride: 101 mEq/L (ref 96–112)
Creat: 1.04 mg/dL (ref 0.50–1.35)
GFR, Est African American: >89 mL/min
GFR, Est Non African American: 87 mL/min
Glucose, Bld: 106 mg/dL — ABNORMAL HIGH (ref 70–99)
Potassium: 4.2 mEq/L (ref 3.5–5.3)
Sodium: 136 mEq/L (ref 135–145)
Total Bilirubin: 0.3 mg/dL (ref 0.2–1.2)
Total Protein: 7.4 g/dL (ref 6.0–8.3)

## 2013-09-18 LAB — LIPID PANEL
Cholesterol: 237 mg/dL — ABNORMAL HIGH (ref 0–200)
HDL: 31 mg/dL — ABNORMAL LOW (ref >39)
Total CHOL/HDL Ratio: 7.6 Ratio
Triglycerides: 651 mg/dL — ABNORMAL HIGH (ref <150)

## 2013-09-18 MED ORDER — ASPIRIN EC 325 MG PO TBEC: 325.0000 mg | DELAYED_RELEASE_TABLET | Freq: Every day | ORAL | Status: DC

## 2013-09-18 MED ORDER — SIMVASTATIN 20 MG PO TABS: 20.0000 mg | ORAL_TABLET | Freq: Every day | ORAL | Status: DC

## 2013-09-18 NOTE — Progress Notes (Signed)
Pt here f/u stroke with right side weakness C/o left knee pain Need disability papers filled out

## 2013-09-18 NOTE — Patient Instructions (Signed)
Smoking Cessation Quitting smoking is important to your health and has many advantages. However, it is not always easy to quit since nicotine is a very addictive drug. Often times, people try 3 times or more before being able to quit. This document explains the best ways for you to prepare to quit smoking. Quitting takes hard work and a lot of effort, but you can do it. ADVANTAGES OF QUITTING SMOKING  You will live longer, feel better, and live better.  Your body will feel the impact of quitting smoking almost immediately.  Within 20 minutes, blood pressure decreases. Your pulse returns to its normal level.  After 8 hours, carbon monoxide levels in the blood return to normal. Your oxygen level increases.  After 24 hours, the chance of having a heart attack starts to decrease. Your breath, hair, and body stop smelling like smoke.  After 48 hours, damaged nerve endings begin to recover. Your sense of taste and smell improve.  After 72 hours, the body is virtually free of nicotine. Your bronchial tubes relax and breathing becomes easier.  After 2 to 12 weeks, lungs can hold more air. Exercise becomes easier and circulation improves.  The risk of having a heart attack, stroke, cancer, or lung disease is greatly reduced.  After 1 year, the risk of coronary heart disease is cut in half.  After 5 years, the risk of stroke falls to the same as a nonsmoker.  After 10 years, the risk of lung cancer is cut in half and the risk of other cancers decreases significantly.  After 15 years, the risk of coronary heart disease drops, usually to the level of a nonsmoker.  If you are pregnant, quitting smoking will improve your chances of having a healthy baby.  The people you live with, especially any children, will be healthier.  You will have extra money to spend on things other than cigarettes. QUESTIONS TO THINK ABOUT BEFORE ATTEMPTING TO QUIT You may want to talk about your answers with your  caregiver.  Why do you want to quit?  If you tried to quit in the past, what helped and what did not?  What will be the most difficult situations for you after you quit? How will you plan to handle them?  Who can help you through the tough times? Your family? Friends? A caregiver?  What pleasures do you get from smoking? What ways can you still get pleasure if you quit? Here are some questions to ask your caregiver:  How can you help me to be successful at quitting?  What medicine do you think would be best for me and how should I take it?  What should I do if I need more help?  What is smoking withdrawal like? How can I get information on withdrawal? GET READY  Set a quit date.  Change your environment by getting rid of all cigarettes, ashtrays, matches, and lighters in your home, car, or work. Do not let people smoke in your home.  Review your past attempts to quit. Think about what worked and what did not. GET SUPPORT AND ENCOURAGEMENT You have a better chance of being successful if you have help. You can get support in many ways.  Tell your family, friends, and co-workers that you are going to quit and need their support. Ask them not to smoke around you.  Get individual, group, or telephone counseling and support. Programs are available at local hospitals and health centers. Call your local health department for   information about programs in your area.  Spiritual beliefs and practices may help some smokers quit.  Download a "quit meter" on your computer to keep track of quit statistics, such as how long you have gone without smoking, cigarettes not smoked, and money saved.  Get a self-help book about quitting smoking and staying off of tobacco. LEARN NEW SKILLS AND BEHAVIORS  Distract yourself from urges to smoke. Talk to someone, go for a walk, or occupy your time with a task.  Change your normal routine. Take a different route to work. Drink tea instead of coffee.  Eat breakfast in a different place.  Reduce your stress. Take a hot bath, exercise, or read a book.  Plan something enjoyable to do every day. Reward yourself for not smoking.  Explore interactive web-based programs that specialize in helping you quit. GET MEDICINE AND USE IT CORRECTLY Medicines can help you stop smoking and decrease the urge to smoke. Combining medicine with the above behavioral methods and support can greatly increase your chances of successfully quitting smoking.  Nicotine replacement therapy helps deliver nicotine to your body without the negative effects and risks of smoking. Nicotine replacement therapy includes nicotine gum, lozenges, inhalers, nasal sprays, and skin patches. Some may be available over-the-counter and others require a prescription.  Antidepressant medicine helps people abstain from smoking, but how this works is unknown. This medicine is available by prescription.  Nicotinic receptor partial agonist medicine simulates the effect of nicotine in your brain. This medicine is available by prescription. Ask your caregiver for advice about which medicines to use and how to use them based on your health history. Your caregiver will tell you what side effects to look out for if you choose to be on a medicine or therapy. Carefully read the information on the package. Do not use any other product containing nicotine while using a nicotine replacement product.  RELAPSE OR DIFFICULT SITUATIONS Most relapses occur within the first 3 months after quitting. Do not be discouraged if you start smoking again. Remember, most people try several times before finally quitting. You may have symptoms of withdrawal because your body is used to nicotine. You may crave cigarettes, be irritable, feel very hungry, cough often, get headaches, or have difficulty concentrating. The withdrawal symptoms are only temporary. They are strongest when you first quit, but they will go away within  10 14 days. To reduce the chances of relapse, try to:  Avoid drinking alcohol. Drinking lowers your chances of successfully quitting.  Reduce the amount of caffeine you consume. Once you quit smoking, the amount of caffeine in your body increases and can give you symptoms, such as a rapid heartbeat, sweating, and anxiety.  Avoid smokers because they can make you want to smoke.  Do not let weight gain distract you. Many smokers will gain weight when they quit, usually less than 10 pounds. Eat a healthy diet and stay active. You can always lose the weight gained after you quit.  Find ways to improve your mood other than smoking. FOR MORE INFORMATION  www.smokefree.gov  Document Released: 07/06/2001 Document Revised: 01/11/2012 Document Reviewed: 10/21/2011 [facility] Patient Information 2014 [place], [place]. Ischemic Stroke A stroke (cerebrovascular accident) is the sudden death of brain tissue. It is a medical emergency. A stroke can cause permanent loss of brain function. This can cause problems with different parts of your body. A transient ischemic attack (TIA) is different because it does not cause permanent damage. A TIA is a short-lived problem of  poor blood flow affecting a part of the brain. A TIA is also a serious problem because having a TIA greatly increases the chances of having a stroke. When symptoms first develop, you cannot know if the problem might be a stroke or TIA. CAUSES  A stroke is caused by a decrease of oxygen supply to an area of your brain. It is usually the result of a small blood clot or collection of cholesterol or fat (plaque) that blocks blood flow in the brain. A stroke can also be caused by blocked or damaged carotid arteries.  RISK FACTORS  High blood pressure (hypertension).  High cholesterol.  Diabetes mellitus.  Heart disease.  The build up of plaque in the blood vessels (peripheral artery disease or atherosclerosis).  The build up of plaque in the  blood vessels providing blood and oxygen to the brain (carotid artery stenosis).  An abnormal heart rhythm (atrial fibrillation).  Obesity.  Smoking.  Taking oral contraceptives (especially in combination with smoking).  Physical inactivity.  A diet high in fats, salt (sodium), and calories.  Alcohol use.  Use of illegal drugs (especially cocaine and methamphetamine).  Being African American.  Being over the age of 10.  Family history of stroke.  Previous history of blood clots, stroke, TIA, or heart attack.  Sickle cell disease. SYMPTOMS  These symptoms usually develop suddenly, or may be newly present upon awakening from sleep:  Sudden weakness or numbness of the face, arm, or leg, especially on one side of the body.  Sudden trouble walking or difficulty moving arms or legs.  Sudden confusion.  Sudden personality changes.  Trouble speaking (aphasia) or understanding.  Difficulty swallowing.  Sudden trouble seeing in one or both eyes.  Double vision.  Dizziness.  Loss of balance or coordination.  Sudden severe headache with no known cause.  Trouble reading or writing. DIAGNOSIS  Your caregiver can often determine the presence or absence of a stroke based on your symptoms, history, and physical exam. Computed tomography (CT) of the brain is usually performed to confirm the stroke, determine causes, and determine stroke severity. Other tests may be done to find the cause of the stroke. These tests may include:  Electrocardiography.  Continuous heart monitoring.  Echocardiography.  Carotid ultrasonography.  Magnetic resonance imaging (MRI).  A scan of the brain circulation.  Blood tests. PREVENTION  The risk of a stroke can be decreased by appropriately treating high blood pressure, high cholesterol, diabetes, heart disease, and obesity and by quitting smoking, limiting alcohol, and staying physically active. TREATMENT  Time is of the essence. It  is important to seek treatment within 3 4 hours of the start of symptoms because you may receive a medicine to dissolve the clot (thrombolytic) that cannot be given after that time. Even if you do not know when your symptoms began, get treatment as soon as possible. After the 4 hour window has passed, treatment may include rest, oxygen, intravenous (IV) fluids, and medicines to thin the blood (anticoagulants). Treatment of stroke depends on the duration, severity, and cause of your symptoms. Medicines and diet may be used to address diabetes, high blood pressure, and other risk factors. Physical, speech, and occupational therapists will assess you and work to improve any functions impaired by the stroke. Measures will be taken to prevent short-term and long-term complications, including infection from breathing foreign material into the lungs (aspiration pneumonia), blood clots in the legs, bedsores, and falls. Rarely, surgery may be needed to remove large blood  clots or to open up blocked arteries. HOME CARE INSTRUCTIONS   Take all medicines prescribed by your caregiver. Follow the directions carefully. Medicines may be used to control risk factors for a stroke. Be sure you understand all your medicine instructions.  You may be told to take aspirin or the anticoagulant warfarin. Warfarin needs to be taken exactly as instructed.  Too much and too little warfarin are both dangerous. Too much warfarin increases the risk of bleeding. Too little warfarin continues to allow the risk for blood clots. While taking warfarin, you will need to have regular blood tests to measure your blood clotting time. These blood tests usually include both the PT and INR tests. The PT and INR results allow your caregiver to adjust your dose of warfarin. The dose can change for many reasons. It is critically important that you take warfarin exactly as prescribed, and that you have your PT and INR levels drawn exactly as  directed.  Many foods, especially foods high in vitamin K can interfere with warfarin and affect the PT and INR results. Foods high in vitamin K include spinach, kale, broccoli, cabbage, collard and turnip greens, brussels sprouts, peas, cauliflower, seaweed, and parsley as well as beef and pork liver, green tea, and soybean oil. You should eat a consistent amount of foods high in vitamin K. Avoid major changes in your diet, or notify your caregiver before changing your diet. Arrange a visit with a dietitian to answer your questions.  Many medicines can interfere with warfarin and affect the PT and INR results. You must tell your caregiver about any and all medicines you take, this includes all vitamins and supplements. Be especially cautious with aspirin and anti-inflammatory medicines. Do not take or discontinue any prescribed or over-the-counter medicine except on the advice of your caregiver or pharmacist.  Warfarin can have side effects, such as excessive bruising or bleeding. You will need to hold pressure over cuts for longer than usual. Your caregiver or pharmacist will discuss other potential side effects.  Avoid sports or activities that may cause injury or bleeding.  Be mindful when shaving, flossing your teeth, or handling sharp objects.  Alcohol can change the body's ability to handle warfarin. It is best to avoid alcoholic drinks or consume only very small amounts while taking warfarin. Notify your caregiver if you change your alcohol intake.  Notify your dentist or other caregivers before procedures.  If swallow studies have determined that your swallowing reflex is present, you should eat healthy foods. A diet that includes 5 or more servings of fruits and vegetables a day may reduce the risk of stroke. Foods may need to be a special consistency (soft or pureed), or small bites may need to be taken in order to avoid aspirating or choking. Certain diets may be prescribed to address  high blood pressure, high cholesterol, diabetes, or obesity.  A low-sodium, low-saturated fat, low-trans fat, low-cholesterol diet is recommended to manage high blood pressure.  A low-saturated fat, low-trans fat, low-cholesterol, and high-fiber diet may control cholesterol levels.  A controlled-carbohydrate, controlled-sugar diet is recommended to manage diabetes.  A reduced-calorie, low-sodium, low-saturated fat, low-trans fat, low-cholesterol diet is recommended to manage obesity.  Maintain a healthy weight.  Stay physically active. It is recommended that you get at least 30 minutes of activity on most or all days.  Do not smoke.  Limit alcohol use even if you are not taking warfarin. Moderate alcohol use is considered to be:  No more than  2 drinks each day for men.  No more than 1 drink each day for nonpregnant women.  Stop drug abuse.  Home safety. A safe home environment is important to reduce the risk of falls. Your caregiver may arrange for specialists to evaluate your home. Having grab bars in the bedroom and bathroom is often important. Your caregiver may arrange for equipment to be used at home, such as raised toilets and a seat for the shower.  Physical, occupational, and speech therapy. Ongoing therapy may be needed to maximize your recovery after a stroke. If you have been advised to use a walker or a cane, use it at all times. Be sure to keep your therapy appointments.  Follow all instructions for follow-up with your caregiver. This is very important. This includes any referrals, physical therapy, rehabilitation, and lab tests. Proper follow up can prevent another stroke from occurring. SEEK MEDICAL CARE IF:  You have personality changes.  You have difficulty swallowing.  You are seeing double.  You have dizziness.  You have a fever.  You have skin breakdown. SEEK IMMEDIATE MEDICAL CARE IF:  Any of these symptoms may represent a serious problem that is an  emergency. Do not wait to see if the symptoms will go away. Get medical help right away. Call your local emergency services (911 in [place] Do not drive yourself to the hospital.  You have sudden weakness or numbness of the face, arm, or leg, especially on one side of the body.  You have sudden trouble walking or difficulty moving arms or legs.  You have sudden confusion.  You have trouble speaking (aphasia) or understanding.  You have sudden trouble seeing in one or both eyes.  You have a loss of balance or coordination.  You have a sudden, severe headache with no known cause.  You have new chest pain or an irregular heartbeat.  You have a partial or total loss of consciousness.   Document Released: 07/12/2005 Document Revised: 03/14/2013 Document Reviewed: 02/20/2012 [facility] Patient Information 2014 [place], [place]. Hypercholesterolemia High Blood Cholesterol Cholesterol is a white, waxy, fat-like protein needed by your body in small amounts. The liver makes all the cholesterol you need. It is carried from the liver by the blood through the blood vessels. Deposits (plaque) may build up on blood vessel walls. This makes the arteries narrower and stiffer. Plaque increases the risk for heart attack and stroke. You cannot feel your cholesterol level even if it is very high. The only way to know is by a blood test to check your lipid (fats) levels. Once you know your cholesterol levels, you should keep a record of the test results. Work with your caregiver to to keep your levels in the desired range. WHAT THE RESULTS MEAN:  Total cholesterol is a rough measure of all the cholesterol in your blood.  LDL is the so-called bad cholesterol. This is the type that deposits cholesterol in the walls of the arteries. You want this level to be low.  HDL is the good cholesterol because it cleans the arteries and carries the LDL away. You want this level to be high.  Triglycerides are fat that the  body can either burn for energy or store. High levels are closely linked to heart disease. DESIRED LEVELS:  Total cholesterol below 200.  LDL below 100 for people at risk, below 70 for very high risk.  HDL above 50 is good, above 60 is best.  Triglycerides below 150. HOW TO LOWER YOUR CHOLESTEROL:  Diet.  Choose fish or white meat chicken and [place], roasted or baked. Limit fatty cuts of red meat, fried foods, and processed meats, such as sausage and lunch meat.  Eat lots of fresh fruits and vegetables. Choose whole grains, beans, pasta, potatoes and cereals.  Use only small amounts of olive, corn or canola oils. Avoid butter, mayonnaise, shortening or palm kernel oils. Avoid foods with trans-fats.  Use skim/nonfat milk and low-fat/nonfat yogurt and cheeses. Avoid whole milk, cream, ice cream, egg yolks and cheeses. Healthy desserts include angel food cake, gingersnaps, animal crackers, hard candy, popsicles, and low-fat/nonfat frozen yogurt. Avoid pastries, cakes, pies and cookies.  Exercise.  A regular program helps decrease LDL and raises HDL.  Helps with weight control.  Do things that increase your activity level like gardening, walking, or taking the stairs.  Medication.  May be prescribed by your caregiver to help lowering cholesterol and the risk for heart disease.  You may need medicine even if your levels are normal if you have several risk factors. HOME CARE INSTRUCTIONS   Follow your diet and exercise programs as suggested by your caregiver.  Take medications as directed.  Have blood work done when your caregiver feels it is necessary. MAKE SURE YOU:   Understand these instructions.  Will watch your condition.  Will get help right away if you are not doing well or get worse. Document Released: 07/12/2005 Document Revised: 10/04/2011 Document Reviewed: 12/28/2006 [facility] Patient Information 2014 [place], [place].

## 2013-09-18 NOTE — Progress Notes (Signed)
Patient ID: Manuel Harrison, male   DOB: [birth year], 43 y.o.   MRN: [id number]   Manuel Harrison, is a 43 y.o. male  [id number]  [id number]  DOB - [birth year]  CC:  Chief Complaint  Patient presents with  . Follow-up       HPI: Manuel Harrison is a 43 y.o. male here today to establish medical care. Patient has history of dyslipidemia and cerebrovascular accident in the past. He is compliant with medications. He is here today with lots of paperwork for disability. He claims he is to has sequelae of previous CVA although not able to say specifically what. He is to smoke heavily about 1 pack per day, drinks alcohol heavily as well as illicit drugs including cocaine and tetrahydrocannabinol. He wants it written that he is not capable of working. Patient has No headache, No chest pain, No abdominal pain - No Nausea, No new weakness tingling or numbness, No Cough - SOB.  No Known Allergies Past Medical History  Diagnosis Date  . Stroke    No current outpatient prescriptions on file prior to visit.   No current facility-administered medications on file prior to visit.   History reviewed. No pertinent family history. History   Social History  . Marital Status: Single    Spouse Name: N/A    Number of Children: N/A  . Years of Education: N/A   Occupational History  . Not on file.   Social History Main Topics  . Smoking status: Current Every Day Smoker -- 2.00 packs/day for 27 years    Types: Cigarettes  . Smokeless tobacco: Not on file  . Alcohol Use: Yes     Comment: 128 oz beer daily  . Drug Use: Yes    Special: Marijuana  . Sexual Activity: Not on file   Other Topics Concern  . Not on file   Social History Narrative  . No narrative on file    Review of Systems: Constitutional: Negative for fever, chills, diaphoresis, activity change, appetite change and fatigue. HENT: Negative for ear pain, nosebleeds, congestion, facial swelling, rhinorrhea, neck pain, neck  stiffness and ear discharge.  Eyes: Negative for pain, discharge, redness, itching and visual disturbance. Respiratory: Negative for cough, choking, chest tightness, shortness of breath, wheezing and stridor.  Cardiovascular: Negative for chest pain, palpitations and leg swelling. Gastrointestinal: Negative for abdominal distention. Genitourinary: Negative for dysuria, urgency, frequency, hematuria, flank pain, decreased urine volume, difficulty urinating and dyspareunia.  Musculoskeletal: Negative for back pain, joint swelling, arthralgia and gait problem. Neurological: Negative for dizziness, tremors, seizures, syncope, facial asymmetry, speech difficulty, weakness, light-headedness, numbness and headaches.  Hematological: Negative for adenopathy. Does not bruise/bleed easily. Psychiatric/Behavioral: Negative for hallucinations, behavioral problems, confusion, dysphoric mood, decreased concentration and agitation.    Objective:   Filed Vitals:   09/18/13 1445  BP: 137/89  Pulse: 96  Temp: 98.5 F (36.9 C)  Resp: 18    Physical Exam: Constitutional: Patient appears well-developed and well-nourished. No distress. Cigarettes stench HENT: Normocephalic, atraumatic, External right and left ear normal. Oropharynx is clear and moist.  Eyes: Conjunctivae and EOM are normal. PERRLA, no scleral icterus. Neck: Normal ROM. Neck supple. No JVD. No tracheal deviation. No thyromegaly. CVS: RRR, S1/S2 +, no murmurs, no gallops, no carotid bruit.  Pulmonary: Effort and breath sounds normal, no stridor, rhonchi, wheezes, rales.  Abdominal: Soft. BS +, no distension, tenderness, rebound or guarding.  Musculoskeletal: Power is 5 out of 5 in all limbs, no sensory deficit Normal range  of motion. No edema and no tenderness.  Lymphadenopathy: No lymphadenopathy noted, cervical, inguinal or axillary Neuro: Alert. Normal reflexes, muscle tone coordination. No cranial nerve deficit. Skin: Skin is warm and  dry. No rash noted. Not diaphoretic. No erythema. No pallor. Psychiatric: Normal mood and affect. Behavior, judgment, thought content normal.  Lab Results  Component Value Date   WBC 5.8 10/05/2012   HGB 16.0 10/05/2012   HCT 43.2 10/05/2012   MCV 88.2 10/05/2012   PLT 277 10/05/2012   Lab Results  Component Value Date   CREATININE 1.12 10/05/2012   BUN 14 10/05/2012   NA 137 10/05/2012   K 4.0 10/05/2012   CL 101 10/05/2012   CO2 21 10/05/2012    Lab Results  Component Value Date   HGBA1C 5.5 10/06/2012   Lipid Panel     Component Value Date/Time   CHOL 204* 01/30/2013 1611   TRIG 382* 01/30/2013 1611   HDL 31* 01/30/2013 1611   CHOLHDL 6.6 01/30/2013 1611   VLDL 76* 01/30/2013 1611   LDLCALC 97 01/30/2013 1611       Assessment and plan:   1. Dyslipidemia  - simvastatin (ZOCOR) 20 MG tablet; Take 1 tablet (20 mg total) by mouth daily at 6 PM.  Dispense: 90 tablet; Refill: 3 - POCT glycosylated hemoglobin (Hb A1C) - Lipid panel  2. Cerebrovascular disease  - aspirin EC 325 MG tablet; Take 1 tablet (325 mg total) by mouth daily.  Dispense: 90 tablet; Refill: 3 - COMPLETE METABOLIC PANEL WITH GFR  3. Smoker Patient extensively counseled about smoking cessation Resources were given  Patient was counseled extensively on nutrition and exercise   Return in about 3 months (around 12/16/2013), or if symptoms worsen or fail to improve, for Annual Physical.  The patient was given clear instructions to go to ER or return to medical center if symptoms don't improve, worsen or new problems develop. The patient verbalized understanding. The patient was told to call to get lab results if they haven't heard anything in the next week.     Jeanann Lewandowsky, MD, MHA, FACP, FAAP [facility] [place], [place] [phone]   09/18/2013, 3:06 PM

## 2013-09-21 ENCOUNTER — Telehealth: Payer: Self-pay

## 2013-09-21 NOTE — Telephone Encounter (Signed)
Called patient Not available Left message on machine to return call

## 2013-09-21 NOTE — Telephone Encounter (Signed)
Message copied by Lestine MountJUAREZ, Ieasha Boerema L on Fri Sep 21, 2013  3:31 PM ------      Message from: Jeanann LewandowskyJEGEDE, OLUGBEMIGA E      Created: Thu Sep 20, 2013  5:15 PM       Please inform patient that his laboratory results shows very high triglyceride, portion of cholesterol. Will start him on an additional medication to his cholesterol meds, he also encouraged low cholesterol diet and low fat diet as well as regular physical exercise at least 3 times a week 30 minutes each time.            Please call in prescription for gemfibrozil 600 mg tablet by mouth 2 times a day, 180 tablets with 3 refills ------

## 2013-12-18 ENCOUNTER — Ambulatory Visit: Payer: Self-pay | Admitting: Internal Medicine

## 2015-11-20 ENCOUNTER — Telehealth: Payer: Self-pay | Admitting: Internal Medicine

## 2015-11-20 NOTE — Telephone Encounter (Signed)
APPT. REMINDER CALL, LMTCB °

## 2015-11-24 ENCOUNTER — Encounter: Payer: Self-pay | Admitting: Internal Medicine

## 2015-11-24 ENCOUNTER — Ambulatory Visit (INDEPENDENT_AMBULATORY_CARE_PROVIDER_SITE_OTHER): Payer: Self-pay | Admitting: Internal Medicine

## 2015-11-24 VITALS — BP 118/73 | HR 65 | Ht 68.0 in | Wt 171.4 lb

## 2015-11-24 DIAGNOSIS — I679 Cerebrovascular disease, unspecified: Secondary | ICD-10-CM

## 2015-11-24 DIAGNOSIS — R058 Other specified cough: Secondary | ICD-10-CM

## 2015-11-24 DIAGNOSIS — J302 Other seasonal allergic rhinitis: Secondary | ICD-10-CM

## 2015-11-24 DIAGNOSIS — E785 Hyperlipidemia, unspecified: Secondary | ICD-10-CM

## 2015-11-24 DIAGNOSIS — Z720 Tobacco use: Secondary | ICD-10-CM

## 2015-11-24 DIAGNOSIS — M1712 Unilateral primary osteoarthritis, left knee: Secondary | ICD-10-CM | POA: Insufficient documentation

## 2015-11-24 DIAGNOSIS — R05 Cough: Secondary | ICD-10-CM

## 2015-11-24 DIAGNOSIS — I10 Essential (primary) hypertension: Secondary | ICD-10-CM

## 2015-11-24 DIAGNOSIS — M25562 Pain in left knee: Secondary | ICD-10-CM

## 2015-11-24 DIAGNOSIS — F17200 Nicotine dependence, unspecified, uncomplicated: Secondary | ICD-10-CM

## 2015-11-24 DIAGNOSIS — G8929 Other chronic pain: Secondary | ICD-10-CM

## 2015-11-24 DIAGNOSIS — I69351 Hemiplegia and hemiparesis following cerebral infarction affecting right dominant side: Secondary | ICD-10-CM

## 2015-11-24 MED ORDER — ATORVASTATIN CALCIUM 10 MG PO TABS: 10.0000 mg | ORAL_TABLET | Freq: Every day | ORAL | Status: DC

## 2015-11-24 MED ORDER — LORATADINE 10 MG PO TABS: 10.0000 mg | ORAL_TABLET | Freq: Every day | ORAL | Status: DC

## 2015-11-24 MED ORDER — DICLOFENAC SODIUM 1 % TD GEL: 4.0000 g | Freq: Four times a day (QID) | TRANSDERMAL | Status: DC

## 2015-11-24 MED ORDER — ASPIRIN EC 81 MG PO TBEC: 81.0000 mg | DELAYED_RELEASE_TABLET | Freq: Every day | ORAL | Status: AC

## 2015-11-24 MED ORDER — MELOXICAM 7.5 MG PO TABS: 7.5000 mg | ORAL_TABLET | Freq: Every day | ORAL | Status: AC

## 2015-11-24 MED ORDER — HYDROCHLOROTHIAZIDE 12.5 MG PO CAPS: 12.5000 mg | ORAL_CAPSULE | Freq: Every day | ORAL | Status: DC

## 2015-11-24 NOTE — Assessment & Plan Note (Signed)
A: Patient says he is not ready to quit today.  P: - Discussed risks of smoking and smoking cessation strategies for 5 minutes.

## 2015-11-24 NOTE — Assessment & Plan Note (Addendum)
A: Patient taking Atorvastatin 20 mg 1/2 tablet qhs (10 mg daily total), not simvastain 20 mg which is in our system. Patient indicates that Atorvastatin doses higher than 10 mg causes aches and drowsiness. Since he has a previous stroke, he may benefit from a high-intensity dose. Nonetheless, we will address adjusting his statin therapy at his next clinic visit. Last recorded LDL was <100 in 2014. Given the patient's concerns over cost, we will defer lipid panel today.  P: Atorvastatin 10 mg daily. - Lipid profile at next visit

## 2015-11-24 NOTE — Assessment & Plan Note (Signed)
A: Patient is asymptomatic now. Instructed him to either use the loratadine Rx for buy OTC, whichever is cheaper.  P: Loratadine 10 mg daily

## 2015-11-24 NOTE — Patient Instructions (Signed)
Mr. Manuel Harrison,  Welcome to the [facility].  It was great to take care of you today.  Your left knee pain is almost certainly arthritis. We will treat this with a 12 day course of meloxicam and Voltaren Gel. You can apply the gel directly to your knee. If it's not better please bring this up at your next clinic visit.  For your blood pressure, one of your medications, lisinopril, has likely been causing your dry cough. We will transition this to hydrochlorothiazide 12.5 mg daily.  I have changed your Atorvastatin to a 10 mg tablet, so you don't have to split this any more.  Have a great day.

## 2015-11-24 NOTE — Progress Notes (Signed)
Subjective:    Patient ID: Manuel Harrison, male    DOB: 09-18-1970, 45 y.o.   MRN: [id number]  HPI  Manuel Harrison is 45 year old man with a PMH as below who comes to the clinic to discuss his ongoing left knee pain as well as some other chronic problems. The patient is new to our clinic. His left knee pain started approximately two years ago. 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 He has taken tylenol and ibuprofen to minimal effect. He denies any fever or chills. He says there is only minimal knee swelling. He has never had knee x-rays. He denies any trauma to the knee.  He also describes a dry cough that has been going on for "years." He believes his cough started shortly after starting lisinopril after his stroke. He cannot recognize any pattern to his dry cough. He denies chest pain or dyspnea. He says he has never taken any other medication for his blood pressure.  He reports have seasonal allergies in the Spring, although he is not symptomatic right now.  For his cholesterol, he takes 1/2 tablet of atorvastatin 20 mg (for a total of 10 mg) daily. Patient reports that higher doses makes him sleepy and causes his body to ache.  The patient has a history of CVA in 2014 with an infarct in the the left lentiform nucleus ascending into the left corona radiata. He reports his only residual problem is right upper extremity weakness.  The patient has smoked 1/2 ppd for the past 25 years. He is not interested in quitting today.  The patient expresses significant concern for the cost of his care and medications. He indicates that he would not pick up a medication if it were over $50.   Active Ambulatory Problems    Diagnosis Date Noted  . Tobacco abuse disorder 10/06/2012  . History of cocaine use 10/06/2012  . Acute ischemic stroke (HCC) 10/07/2012    . Dyslipidemia 01/30/2013  . Left knee pain 11/24/2015  . Essential hypertension 11/24/2015  . Dry cough 11/24/2015  . Seasonal allergies 11/24/2015    Review of Systems  Constitutional: Negative for fever and chills.  HENT: Negative for congestion and sore throat.   Eyes: Negative for redness and itching.  Respiratory: Positive for cough. Negative for shortness of breath and wheezing.   Cardiovascular: Negative for chest pain and palpitations.  Gastrointestinal: Negative for nausea, vomiting and diarrhea.  Genitourinary: Negative for urgency and decreased urine volume.  Musculoskeletal: Positive for joint swelling and arthralgias.  Skin: Negative for rash and wound.  Neurological: Negative for dizziness and headaches.  Psychiatric/Behavioral: Negative for dysphoric mood. The patient is not nervous/anxious.        Objective:   Physical Exam  Constitutional: He appears well-developed and well-nourished. No distress.  HENT:  Mouth/Throat: Oropharynx is clear and moist. No oropharyngeal exudate.  Eyes: Conjunctivae and EOM are normal. No scleral icterus.  Neck: Neck supple.  No carotid bruits.  Cardiovascular: Normal rate, regular rhythm and normal heart sounds.   Pulmonary/Chest: Effort normal and breath sounds normal. No respiratory distress. He has no wheezes.  Abdominal: Soft. Bowel sounds are normal. He exhibits no distension. There is no tenderness.  Musculoskeletal:  Left knee exhibits negligible swelling. Left knee is not warm. Full range  of motion and pain only elicited on resistance to extension. Mild TTP of left knee. Right knee normal.  Neurological: He is alert. He has normal reflexes.  4+/5 in RUE, all other extremities 5/5. FTN Nl. Tongue midline, face symmetric. Speech normal.  Skin: Skin is warm and dry.  Psychiatric: He has a normal mood and affect. His behavior is normal.  Vitals reviewed.         Assessment & Plan:   Please see problem based  assessment and plan for details.

## 2015-11-24 NOTE — Assessment & Plan Note (Addendum)
A: Patient's chronic knee pain most consistent w/ OA. No fever and non-acute sx make septic and gout less likely. Nonetheless, it is curious that gentleman who is not obese should develop OA at 44. Nonetheless, we will treat the patient with a 12 day course of meloxicam, which is relatively inexpensive. Once he can afford voltaren gel when he gets paid in 12 days, he will obtain that as well. Patient not interested in knee x-rays at this time due to cost.  P:  Meloxicam 7.5 mg daily Voltaren gel Reassess at next visit

## 2015-11-24 NOTE — Assessment & Plan Note (Addendum)
A: Patient is normotensive today, but has had elevated BP readings >140/90 in the past. Therefore, he is likely benefiting from his antihypertensive regimen. Nonetheless, he is likely experiencing side effect (see "dry cough"). Rather than transition him to an ARB, we will initiated HCTZ as he has never tried this medication in the past. This medication also has the advantage of being less expensive.  P:  - HCTZ 12.5 mg daily - BMET next visit in 2 months

## 2015-11-24 NOTE — Assessment & Plan Note (Signed)
A: Dry cough with initiation of lisinopril is consistent ACEI-induced cough.  P: Replace lisinopril with HCTZ, assess for sx resolution.

## 2015-11-25 NOTE — Progress Notes (Signed)
Case discussed with Dr. Ford at the time of the visit. We reviewed the resident's history and exam and pertinent patient test results. I agree with the assessment, diagnosis, and plan of care documented in the resident's note. 

## 2016-02-01 ENCOUNTER — Emergency Department (HOSPITAL_COMMUNITY)
Admission: EM | Admit: 2016-02-01 | Discharge: 2016-02-01 | Disposition: A | Discharge status: Home / Self Care | Payer: Self-pay | Attending: Emergency Medicine | Admitting: Emergency Medicine

## 2016-02-01 ENCOUNTER — Encounter (HOSPITAL_COMMUNITY): Payer: Self-pay | Admitting: *Deleted

## 2016-02-01 DIAGNOSIS — Z79899 Other long term (current) drug therapy: Secondary | ICD-10-CM | POA: Insufficient documentation

## 2016-02-01 DIAGNOSIS — K029 Dental caries, unspecified: Secondary | ICD-10-CM

## 2016-02-01 DIAGNOSIS — Z720 Tobacco use: Secondary | ICD-10-CM

## 2016-02-01 DIAGNOSIS — Z8673 Personal history of transient ischemic attack (TIA), and cerebral infarction without residual deficits: Secondary | ICD-10-CM | POA: Insufficient documentation

## 2016-02-01 DIAGNOSIS — F17210 Nicotine dependence, cigarettes, uncomplicated: Secondary | ICD-10-CM | POA: Insufficient documentation

## 2016-02-01 DIAGNOSIS — Z7982 Long term (current) use of aspirin: Secondary | ICD-10-CM | POA: Insufficient documentation

## 2016-02-01 DIAGNOSIS — K047 Periapical abscess without sinus: Secondary | ICD-10-CM

## 2016-02-01 MED ORDER — NAPROXEN 500 MG PO TABS: 500.0000 mg | ORAL_TABLET | Freq: Two times a day (BID) | ORAL | Status: DC | PRN

## 2016-02-01 MED ORDER — BENZOCAINE 20 % MT AERO: INHALATION_SPRAY | Freq: Once | OROMUCOSAL | Status: DC

## 2016-02-01 MED ORDER — HYDROCODONE-ACETAMINOPHEN 5-325 MG PO TABS: 1.0000 | ORAL_TABLET | Freq: Four times a day (QID) | ORAL | Status: DC | PRN

## 2016-02-01 MED ORDER — DOXYCYCLINE HYCLATE 100 MG PO CAPS: 100.0000 mg | ORAL_CAPSULE | Freq: Two times a day (BID) | ORAL | Status: DC

## 2016-02-01 MED ORDER — HYDROCODONE-ACETAMINOPHEN 5-325 MG PO TABS
1.0000 | ORAL_TABLET | Freq: Once | ORAL | Status: AC
  Administered 2016-02-01: 1 via ORAL
  Filled 2016-02-01: qty 1

## 2016-02-01 NOTE — ED Provider Notes (Signed)
CSN: [id number]     Arrival date & time 02/01/16  0818 History   First MD Initiated Contact with Patient 02/01/16 [phone]     No chief complaint on file.    (Consider location/radiation/quality/duration/timing/severity/associated sxs/prior Treatment) HPI Comments: Manuel VELDHUIZEN is a 45 y.o. male with a PMHx of HTN, HLD, remote CVA, hx of cocaine use, tobacco use, and arthritis, who presents to the ED with complaints of right lower dental pain 2 days. He describes the pain as 10/10 constant sharp nonradiating pain in the right lower molars, worse with chewing, and unrelieved with Tylenol and Valium given to him by a friend. Associated symptoms include gum and facial swelling that began last night. He is a smoker and he has not seen a dentist "in a while". He denies any fevers, chills, trismus, drooling, difficulty swallowing, neck swelling, gum drainage, ear pain or drainage, chest pain, shortness breath, abdominal pain, nausea, vomiting, diarrhea, constipation, dysuria, hematuria, numbness, tingling, or focal weakness.  Patient is a 45 y.o. male presenting with tooth pain. The history is provided by the patient and medical records. No language interpreter was used.  Dental Pain Location:  Lower Lower teeth location:  30/RL 1st molar and 31/RL 2nd molar Quality:  Sharp Severity:  Severe Onset quality:  Gradual Duration:  2 days Timing:  Constant Progression:  Worsening Chronicity:  New Context: dental caries and poor dentition   Relieved by:  Nothing Worsened by:  Touching and pressure Ineffective treatments:  Acetaminophen (and valium) Associated symptoms: facial swelling and gum swelling   Associated symptoms: no difficulty swallowing, no drooling, no fever, no oral bleeding and no trismus   Risk factors: lack of dental care and smoking     Past Medical History  Diagnosis Date  . Stroke [facility])    No past surgical history on file. No family history on file. Social History    Substance Use Topics  . Smoking status: Current Every Day Smoker -- 0.50 packs/day for 27 years    Types: Cigarettes  . Smokeless tobacco: Not on file  . Alcohol Use: 0.0 oz/week    0 Standard drinks or equivalent per week     Comment: 128 oz beer daily    Review of Systems  Constitutional: Negative for fever and chills.  HENT: Positive for dental problem and facial swelling. Negative for drooling, ear discharge, ear pain, sore throat and trouble swallowing.   Respiratory: Negative for shortness of breath.   Cardiovascular: Negative for chest pain.  Gastrointestinal: Negative for nausea, vomiting, abdominal pain, diarrhea and constipation.  Genitourinary: Negative for dysuria and hematuria.  Musculoskeletal: Negative for myalgias and arthralgias.  Skin: Negative for color change.  Allergic/Immunologic: Negative for immunocompromised state.  Neurological: Negative for weakness and numbness.  Psychiatric/Behavioral: Negative for confusion.   10 Systems reviewed and are negative for acute change except as noted in the HPI.    Allergies  Review of patient's allergies indicates no known allergies.  Home Medications   Prior to Admission medications   Medication Sig Start Date End Date Taking? Authorizing Provider  aspirin EC 81 MG tablet Take 1 tablet (81 mg total) by mouth daily. 11/24/15 11/23/16  Ruben Im, MD  atorvastatin (LIPITOR) 10 MG tablet Take 1 tablet (10 mg total) by mouth daily at 6 PM. 11/24/15   Ruben Im, MD  diclofenac sodium (VOLTAREN) 1 % GEL Apply 4 g topically 4 (four) times daily. 11/24/15   Ruben Im, MD  hydrochlorothiazide (MICROZIDE) 12.5 MG capsule  Take 1 capsule (12.5 mg total) by mouth daily. 11/24/15 11/23/16  Ruben Im, MD  loratadine (CLARITIN) 10 MG tablet Take 1 tablet (10 mg total) by mouth daily. 11/24/15 11/23/16  Ruben Im, MD  meloxicam (MOBIC) 7.5 MG tablet Take 1 tablet (7.5 mg total) by mouth daily. 11/24/15 11/23/16  Ruben Im, MD   BP 144/84  mmHg  Pulse 79  Temp(Src) 98.2 F (36.8 C) (Oral)  Resp 20  SpO2 100% Physical Exam  Constitutional: He is oriented to person, place, and time. Vital signs are normal. He appears well-developed and well-nourished.  Non-toxic appearance. No distress.  Afebrile, nontoxic, NAD  HENT:  Head: Normocephalic and atraumatic.  Nose: Nose normal.  Mouth/Throat: Uvula is midline, oropharynx is clear and moist and mucous membranes are normal. No trismus in the jaw. Dental abscesses and dental caries present. No uvula swelling.    R lower molars #30-31 decayed with moderate surrounding gingival erythema and swelling, mostly indurated but with a small area that seems somewhat fluctuant, subfloor of mouth soft and nontender, no evidence of ludwig's, airway patent, with R lower mandibular swelling and tenderness around the area where the abscess is located, but does not obscure the angle of the jaw. Nose clear. Oropharynx clear and moist, without uvular swelling or deviation, no trismus or drooling, no tonsillar swelling or erythema, no exudates.    Eyes: Conjunctivae and EOM are normal. Right eye exhibits no discharge. Left eye exhibits no discharge.  Neck: Normal range of motion. Neck supple.  Cardiovascular: Normal rate.   Pulmonary/Chest: Effort normal. No respiratory distress.  Abdominal: Normal appearance. He exhibits no distension.  Musculoskeletal: Normal range of motion.  Lymphadenopathy:       Head (right side): Submandibular adenopathy present.  Reactive R sided submandibular LAD which is nonTTP  Neurological: He is alert and oriented to person, place, and time. He has normal strength. No sensory deficit.  Skin: Skin is warm, dry and intact. No rash noted.  Psychiatric: He has a normal mood and affect.  Nursing note and vitals reviewed.   ED Course  .Marland KitchenIncision and Drainage Date/Time: 02/01/2016 9:04 AM Performed by: Marjean Donna Jaynell Castagnola Authorized by: Allen Derry Consent:  Verbal consent obtained. Risks and benefits: risks, benefits and alternatives were discussed Consent given by: patient Patient understanding: patient states understanding of the procedure being performed Patient consent: the patient's understanding of the procedure matches consent given Patient identity confirmed: verbally with patient Type: abscess Body area: mouth Location details: alveolar process Local anesthetic: topical anesthetic Patient sedated: no Needle gauge: 18 Incision type: puncture. Complexity: simple Drainage: serosanguinous Drainage amount: scant Wound treatment: wound left open Patient tolerance: Patient tolerated the procedure well with no immediate complications   (including critical care time) Labs Review Labs Reviewed - No data to display  Imaging Review No results found. I have personally reviewed and evaluated these images and lab results as part of my medical decision-making.   EKG Interpretation None      MDM   Final diagnoses:  Dental abscess  Pain due to dental caries  Tobacco use    45 y.o. male here with R dental pain and jaw/gum swelling x2 days. Dental decay near tooth #30 with surrounding gingival erythema and swelling, mostly indurated but one small area that seems more fluctuant so will attempt to topically numb the area and insert an 18G needle to aspirate infection out if possible. Will give pain meds.Patient afebrile, non toxic appearing and swallowing secretions well. No evidence of  ludwig's. Will needle aspirate and reassess.  9:05 AM Needle aspiration without much frank pus, likely not quite ready for I&D. Needle puncture left open to drain. I gave patient referral to dentist and stressed the importance of dental follow up for ultimate management of dental pain.  I have also discussed reasons to return immediately to the ER.  Patient expresses understanding and agrees with plan.  I will also give doxycycline and pain control. Smoking  cessation advised.   BP 144/84 mmHg  Pulse 79  Temp(Src) 98.2 F (36.8 C) (Oral)  Resp 20  SpO2 100%  Meds ordered this encounter  Medications  . Benzocaine (HURRCAINE) 20 % mouth spray    Sig:   . HYDROcodone-acetaminophen (NORCO/VICODIN) 5-325 MG per tablet 1 tablet    Sig:   . HYDROcodone-acetaminophen (NORCO) 5-325 MG tablet    Sig: Take 1-2 tablets by mouth every 6 (six) hours as needed for severe pain.    Dispense:  15 tablet    Refill:  0    Order Specific Question:  Supervising Provider    Answer:  MILLER, BRIAN [[id number]]  . doxycycline (VIBRAMYCIN) 100 MG capsule    Sig: Take 1 capsule (100 mg total) by mouth 2 (two) times daily. One po bid x 7 days    Dispense:  14 capsule    Refill:  0    Order Specific Question:  Supervising Provider    Answer:  MILLER, BRIAN [[id number]]  . naproxen (NAPROSYN) 500 MG tablet    Sig: Take 1 tablet (500 mg total) by mouth 2 (two) times daily as needed for mild pain, moderate pain or headache (TAKE WITH MEALS.).    Dispense:  20 tablet    Refill:  0    Order Specific Question:  Supervising Provider    Answer:  Eber HongMILLER, BRIAN [[id number]]     Christiana Gurevich Camprubi-Soms, PA-C 02/01/16 [phone]  Margarita Grizzleanielle Ray, MD 02/01/16 1729

## 2016-02-01 NOTE — Discharge Instructions (Signed)
Apply warm compresses to jaw throughout the day. Take antibiotic until finished. Avoid sun exposure while taking this antibiotic. Use tylenol or naprosyn as needed for pain, or norco as directed as needed for additional pain relief but don't drive while taking that medication. Perform salt water swishes to help with pain/swelling. Followup with a dentist is very important for ongoing evaluation and management of recurrent dental pain, call the dentist listed above in the next 24-48 hours to schedule ongoing dental care, or use the list below to find a dentist. STOP SMOKING! Return to emergency department for emergent changing or worsening symptoms.   Dental Abscess A dental abscess is pus in or around a tooth. HOME CARE  Take medicines only as told by your dentist.  If you were prescribed antibiotic medicine, finish all of it even if you start to feel better.  Rinse your mouth (gargle) often with salt water.  Do not drive or use heavy machinery, like a lawn mower, while taking pain medicine.  Do not apply heat to the outside of your mouth.  Keep all follow-up visits as told by your dentist. This is important. GET HELP IF:  Your pain is worse, and medicine does not help. GET HELP RIGHT AWAY IF:  You have a fever or chills.  Your symptoms suddenly get worse.  You have a very bad headache.  You have problems breathing or swallowing.  You have trouble opening your mouth.  You have puffiness (swelling) in your neck or around your eye.   This information is not intended to replace advice given to you by your health care provider. Make sure you discuss any questions you have with your health care provider.   Document Released: 11/26/2014 Document Reviewed: 11/26/2014 Elsevier Interactive Patient Education 2016 [employer].  Smoking Cessation, Tips for Success If you are ready to quit smoking, congratulations! You have chosen to help yourself be healthier. Cigarettes bring  nicotine, tar, carbon monoxide, and other irritants into your body. Your lungs, heart, and blood vessels will be able to work better without these poisons. There are many different ways to quit smoking. Nicotine gum, nicotine patches, a nicotine inhaler, or nicotine nasal spray can help with physical craving. Hypnosis, support groups, and medicines help break the habit of smoking. WHAT THINGS CAN I DO TO MAKE QUITTING EASIER?  Here are some tips to help you quit for good:  Pick a date when you will quit smoking completely. Tell all of your friends and family about your plan to quit on that date.  Do not try to slowly cut down on the number of cigarettes you are smoking. Pick a quit date and quit smoking completely starting on that day.  Throw away all cigarettes.   Clean and remove all ashtrays from your home, work, and car.  On a card, write down your reasons for quitting. Carry the card with you and read it when you get the urge to smoke.  Cleanse your body of nicotine. Drink enough water and fluids to keep your urine clear or pale yellow. Do this after quitting to flush the nicotine from your body.  Learn to predict your moods. Do not let a bad situation be your excuse to have a cigarette. Some situations in your life might tempt you into wanting a cigarette.  Never have "just one" cigarette. It leads to wanting another and another. Remind yourself of your decision to quit.  Change habits associated with smoking. If you smoked while driving or  when feeling stressed, try other activities to replace smoking. Stand up when drinking your coffee. Brush your teeth after eating. Sit in a different chair when you read the paper. Avoid alcohol while trying to quit, and try to drink fewer caffeinated beverages. Alcohol and caffeine may urge you to smoke.  Avoid foods and drinks that can trigger a desire to smoke, such as sugary or spicy foods and alcohol.  Ask people who smoke not to smoke around  you.  Have something planned to do right after eating or having a cup of coffee. For example, plan to take a walk or exercise.  Try a relaxation exercise to calm you down and decrease your stress. Remember, you may be tense and nervous for the first 2 weeks after you quit, but this will pass.  Find new activities to keep your hands busy. Play with a pen, coin, or rubber band. Doodle or draw things on paper.  Brush your teeth right after eating. This will help cut down on the craving for the taste of tobacco after meals. You can also try mouthwash.   Use oral substitutes in place of cigarettes. Try using lemon drops, carrots, cinnamon sticks, or chewing gum. Keep them handy so they are available when you have the urge to smoke.  When you have the urge to smoke, try deep breathing.  Designate your home as a nonsmoking area.  If you are a heavy smoker, ask your health care provider about a prescription for nicotine chewing gum. It can ease your withdrawal from nicotine.  Reward yourself. Set aside the cigarette money you save and buy yourself something nice.  Look for support from others. Join a support group or smoking cessation program. Ask someone at home or at work to help you with your plan to quit smoking.  Always ask yourself, "Do I need this cigarette or is this just a reflex?" Tell yourself, "Today, I choose not to smoke," or "I do not want to smoke." You are reminding yourself of your decision to quit.  Do not replace cigarette smoking with electronic cigarettes (commonly called e-cigarettes). The safety of e-cigarettes is unknown, and some may contain harmful chemicals.  If you relapse, do not give up! Plan ahead and think about what you will do the next time you get the urge to smoke. HOW WILL I FEEL WHEN I QUIT SMOKING? You may have symptoms of withdrawal because your body is used to nicotine (the addictive substance in cigarettes). You may crave cigarettes, be irritable, feel  very hungry, cough often, get headaches, or have difficulty concentrating. The withdrawal symptoms are only temporary. They are strongest when you first quit but will go away within 10-14 days. When withdrawal symptoms occur, stay in control. Think about your reasons for quitting. Remind yourself that these are signs that your body is healing and getting used to being without cigarettes. Remember that withdrawal symptoms are easier to treat than the major diseases that smoking can cause.  Even after the withdrawal is over, expect periodic urges to smoke. However, these cravings are generally short lived and will go away whether you smoke or not. Do not smoke! WHAT RESOURCES ARE AVAILABLE TO HELP ME QUIT SMOKING? Your health care provider can direct you to community resources or hospitals for support, which may include:  Group support.  Education.  Hypnosis.  Therapy.   This information is not intended to replace advice given to you by your health care provider. Make sure you discuss any  questions you have with your health care provider.   Document Released: 04/09/2004 Document Revised: 08/02/2014 Document Reviewed: 12/28/2012 Elsevier Interactive Patient Education 2016 [employer].   Emergency Department Resource Guide 1) Find a Doctor and Pay Out of Pocket Although you won't have to find out who is covered by your insurance plan, it is a good idea to ask around and get recommendations. You will then need to call the office and see if the doctor you have chosen will accept you as a new patient and what types of options they offer for patients who are self-pay. Some doctors offer discounts or will set up payment plans for their patients who do not have insurance, but you will need to ask so you aren't surprised when you get to your appointment.  2) Contact Your Local Health Department Not all health departments have doctors that can see patients for sick visits, but many do, so it is worth a  call to see if yours does. If you don't know where your local health department is, you can check in your phone book. The CDC also has a tool to help you locate your state's health department, and many state websites also have listings of all of their local health departments.  3) Find a Walk-in Clinic If your illness is not likely to be very severe or complicated, you may want to try a walk in clinic. These are popping up all over the country in pharmacies, drugstores, and shopping centers. They're usually staffed by nurse practitioners or physician assistants that have been trained to treat common illnesses and complaints. They're usually fairly quick and inexpensive. However, if you have serious medical issues or chronic medical problems, these are probably not your best option.  No Primary Care Doctor: - Call Health Connect at  [phone] - they can help you locate a primary care doctor that  accepts your insurance, provides certain services, etc. - Physician Referral Service- [phone]  Chronic Pain Problems: Organization         Address  Phone   Notes  [facility]  [phone] Patients need to be referred by their primary care doctor.   Medication Assistance: Organization         Address  Phone   Notes  [facility] [address] [place], [place] [address] [phone] --Must be a resident of [facility] -- Must have NO insurance coverage whatsoever (no Medicaid/ Medicare, etc.) -- The pt. MUST have a primary care doctor that directs their care regularly and follows them in the community   [facility]  [phone]   [place]  [phone]     Dental Care: Organization         Address  Phone  Notes  [facility] Department of [facility] [address] [place], [place] [phone] Accepts children up to age 44 who are enrolled in [place] or [place] Health Choice; pregnant  women with a Medicaid card; and children who have applied for Medicaid or [place] Health Choice, but were declined, whose parents can pay a reduced fee at time of service.  [facility] Department of [facility]  [address], [place] [phone] Accepts children up to age 78 who are enrolled in [place] or [place] Health Choice; pregnant women with a Medicaid card; and children who have applied for Medicaid or [place] Health Choice, but were declined, whose parents can  pay a reduced fee at time of service.  [facility]  [address], [place] [phone] Patients are seen by appointment only. Walk-ins are not accepted. [facility] will see patients 47 years of age and older. Monday - Tuesday (8am-5pm) Most Wednesdays (8:30-5pm) $30 per visit, cash only  [facility]  [address], [place] [phone] Patients are seen by appointment only. Walk-ins are not accepted. [facility] will see patients 47 years of age and older. One Wednesday Evening (Monthly: Volunteer Based).  $30 per visit, cash only  [facility]  [phone] for adults; Children under age 554, call Graduate Pediatric Dentistry at [phone]. Children aged 804-14, please call [phone] to request a pediatric application.  Dental services are provided in all areas of dental care including fillings, crowns and bridges, complete and partial dentures, implants, gum treatment, root canals, and extractions. Preventive care is also provided. Treatment is provided to both adults and children. Patients are selected via a lottery and there is often a waiting list.   [facility] [address], [place]  [phone] [email or web address]   [facility] [address], [place], [place] [phone], Ext. [phone] Second and Fourth Thursday of each month, opens at 6:30 AM; Clinic ends at 9 AM.  Patients are  seen on a first-come first-served basis, and a limited number are seen during each clinic.   [facility]  [address], [place], [place] [phone]   Eligibility Requirements You must have lived in [place], [place], or [place] for at least the last three months.   You cannot be eligible for state or federal sponsored [place]care insurance, including [employer]ans [employer], [place], or [employer]icare.   You generally cannot be eligible for healthcare insurance through your employer.    How to apply: Eligibility screenings are held every Tuesday and Wednesday afternoon from 1:00 pm until 4:00 pm. You do not need an appointment for the interview!  [facility] [address], [place], [place] [phone]   [facility]  [phone]   [facility]  [phone]   [facility]  [phone]

## 2016-02-01 NOTE — ED Notes (Signed)
PT reports swelling to RT side of face. Pain is 10/10

## 2016-09-29 ENCOUNTER — Emergency Department (HOSPITAL_COMMUNITY): Payer: Self-pay

## 2016-09-29 ENCOUNTER — Encounter (HOSPITAL_COMMUNITY): Payer: Self-pay | Admitting: Emergency Medicine

## 2016-09-29 ENCOUNTER — Emergency Department (HOSPITAL_COMMUNITY)
Admission: EM | Admit: 2016-09-29 | Discharge: 2016-09-29 | Disposition: A | Discharge status: Home / Self Care | Payer: Self-pay | Attending: Emergency Medicine | Admitting: Emergency Medicine

## 2016-09-29 DIAGNOSIS — Z79899 Other long term (current) drug therapy: Secondary | ICD-10-CM | POA: Insufficient documentation

## 2016-09-29 DIAGNOSIS — R112 Nausea with vomiting, unspecified: Secondary | ICD-10-CM | POA: Insufficient documentation

## 2016-09-29 DIAGNOSIS — Z8673 Personal history of transient ischemic attack (TIA), and cerebral infarction without residual deficits: Secondary | ICD-10-CM | POA: Insufficient documentation

## 2016-09-29 DIAGNOSIS — R1084 Generalized abdominal pain: Secondary | ICD-10-CM | POA: Insufficient documentation

## 2016-09-29 DIAGNOSIS — I10 Essential (primary) hypertension: Secondary | ICD-10-CM | POA: Insufficient documentation

## 2016-09-29 DIAGNOSIS — Z7982 Long term (current) use of aspirin: Secondary | ICD-10-CM | POA: Insufficient documentation

## 2016-09-29 DIAGNOSIS — F17210 Nicotine dependence, cigarettes, uncomplicated: Secondary | ICD-10-CM | POA: Insufficient documentation

## 2016-09-29 HISTORY — DX: Essential (primary) hypertension: I10

## 2016-09-29 LAB — CBC WITH DIFFERENTIAL/PLATELET
Basophils Absolute: 0 10*3/uL (ref 0.0–0.1)
Basophils Relative: 1 %
Eosinophils Absolute: 0.1 10*3/uL (ref 0.0–0.7)
Eosinophils Relative: 2 %
HCT: 39.8 % (ref 39.0–52.0)
Hemoglobin: 14 g/dL (ref 13.0–17.0)
Lymphocytes Relative: 45 %
Lymphs Abs: 1.8 10*3/uL (ref 0.7–4.0)
MCH: 30.8 pg (ref 26.0–34.0)
MCHC: 35.2 g/dL (ref 30.0–36.0)
MCV: 87.7 fL (ref 78.0–100.0)
Monocytes Absolute: 0.3 10*3/uL (ref 0.1–1.0)
Monocytes Relative: 8 %
Neutro Abs: 1.9 10*3/uL (ref 1.7–7.7)
Neutrophils Relative %: 46 %
Platelets: 231 10*3/uL (ref 150–400)
RBC: 4.54 MIL/uL (ref 4.22–5.81)
RDW: 13.6 % (ref 11.5–15.5)
WBC: 4.1 10*3/uL (ref 4.0–10.5)

## 2016-09-29 LAB — COMPREHENSIVE METABOLIC PANEL
ALT: 18 U/L (ref 17–63)
AST: 24 U/L (ref 15–41)
Albumin: 3.9 g/dL (ref 3.5–5.0)
Alkaline Phosphatase: 43 U/L (ref 38–126)
Anion gap: 7 (ref 5–15)
BUN: 5 mg/dL — ABNORMAL LOW (ref 6–20)
CO2: 24 mmol/L (ref 22–32)
Calcium: 9.3 mg/dL (ref 8.9–10.3)
Chloride: 105 mmol/L (ref 101–111)
Creatinine, Ser: 1.04 mg/dL (ref 0.61–1.24)
GFR calc Af Amer: >60 mL/min (ref >60)
GFR calc non Af Amer: >60 mL/min (ref >60)
Glucose, Bld: 95 mg/dL (ref 65–99)
Potassium: 4 mmol/L (ref 3.5–5.1)
Sodium: 136 mmol/L (ref 135–145)
Total Bilirubin: 0.4 mg/dL (ref 0.3–1.2)
Total Protein: 7 g/dL (ref 6.5–8.1)

## 2016-09-29 LAB — LIPASE, BLOOD: Lipase: 30 U/L (ref 11–51)

## 2016-09-29 MED ORDER — ONDANSETRON HCL 4 MG/2ML IJ SOLN
4.0000 mg | Freq: Once | INTRAMUSCULAR | Status: AC
  Administered 2016-09-29: 4 mg via INTRAVENOUS
  Filled 2016-09-29: qty 2

## 2016-09-29 MED ORDER — MORPHINE SULFATE (PF) 4 MG/ML IV SOLN
4.0000 mg | Freq: Once | INTRAVENOUS | Status: AC
  Administered 2016-09-29: 4 mg via INTRAVENOUS
  Filled 2016-09-29: qty 1

## 2016-09-29 MED ORDER — ONDANSETRON HCL 4 MG PO TABS: 4.0000 mg | ORAL_TABLET | Freq: Four times a day (QID) | ORAL | 0 refills | Status: DC | PRN

## 2016-09-29 MED ORDER — IOPAMIDOL (ISOVUE-300) INJECTION 61%
INTRAVENOUS | Status: AC
  Administered 2016-09-29: 100 mL
  Filled 2016-09-29: qty 100

## 2016-09-29 MED ORDER — SODIUM CHLORIDE 0.9 % IV BOLUS (SEPSIS)
1000.0000 mL | Freq: Once | INTRAVENOUS | Status: AC
  Administered 2016-09-29: 1000 mL via INTRAVENOUS

## 2016-09-29 NOTE — ED Notes (Signed)
ED Provider at bedside. 

## 2016-09-29 NOTE — ED Provider Notes (Signed)
[facility] DEPT Provider Note   CSN: [id number] Arrival date & time: 09/29/16  4098     History   Chief Complaint Chief Complaint  Patient presents with  . Abdominal Pain    HPI Manuel Harrison is a 46 y.o. male.  The history is provided by the patient.  Abdominal Pain   This is a new problem. The current episode started 6 to 12 hours ago. The problem occurs constantly. The problem has not changed since onset.The pain is associated with suspicious food intake. The pain is located in the generalized abdominal region. The pain is at a severity of 8/10. The pain is moderate. Associated symptoms include nausea and vomiting. Pertinent negatives include fever, diarrhea, melena, constipation, dysuria, frequency and hematuria. The symptoms are aggravated by vomiting. Nothing relieves the symptoms. His past medical history does not include PUD, gallstones, ulcerative colitis or Crohn's disease.   No prior abdominal surgeries. At [facility] last night, and halfway through noticed burger was incompletely cooked. Having 3 3 episodes nonbloody nonbilious emesis since 11PM. Normal BM this morning. Girlfriend also ate bite of burger and not feeling well, but no n/v/d.   Past Medical History:  Diagnosis Date  . Hypertension   . Stroke [facility])     Patient Active Problem List   Diagnosis Date Noted  . Osteoarthritis of left knee 11/24/2015  . Essential hypertension 11/24/2015  . Dry cough 11/24/2015  . Seasonal allergies 11/24/2015  . Dyslipidemia 01/30/2013  . Acute ischemic stroke (HCC) 10/07/2012  . Tobacco abuse disorder 10/06/2012  . History of cocaine use 10/06/2012    History reviewed. No pertinent surgical history.     Home Medications    Prior to Admission medications   Medication Sig Start Date End Date Taking? Authorizing Provider  ibuprofen (ADVIL,MOTRIN) 200 MG tablet Take 200 mg by mouth every 6 (six) hours as needed for moderate pain.   Yes Historical Provider, MD    aspirin EC 81 MG tablet Take 1 tablet (81 mg total) by mouth daily. Patient not taking: Reported on 09/29/2016 11/24/15 11/23/16  Ruben Im, MD  atorvastatin (LIPITOR) 10 MG tablet Take 1 tablet (10 mg total) by mouth daily at 6 PM. Patient not taking: Reported on 09/29/2016 11/24/15   Ruben Im, MD  diclofenac sodium (VOLTAREN) 1 % GEL Apply 4 g topically 4 (four) times daily. Patient not taking: Reported on 09/29/2016 11/24/15   Ruben Im, MD  doxycycline (VIBRAMYCIN) 100 MG capsule Take 1 capsule (100 mg total) by mouth 2 (two) times daily. One po bid x 7 days Patient not taking: Reported on 09/29/2016 02/01/16   Mercedes Street, PA-C  hydrochlorothiazide (MICROZIDE) 12.5 MG capsule Take 1 capsule (12.5 mg total) by mouth daily. Patient not taking: Reported on 09/29/2016 11/24/15 11/23/16  Ruben Im, MD  HYDROcodone-acetaminophen [facility]) 5-325 MG tablet Take 1-2 tablets by mouth every 6 (six) hours as needed for severe pain. Patient not taking: Reported on 09/29/2016 02/01/16   Mercedes Street, PA-C  loratadine (CLARITIN) 10 MG tablet Take 1 tablet (10 mg total) by mouth daily. Patient not taking: Reported on 09/29/2016 11/24/15 11/23/16  Ruben Im, MD  meloxicam (MOBIC) 7.5 MG tablet Take 1 tablet (7.5 mg total) by mouth daily. Patient not taking: Reported on 09/29/2016 11/24/15 11/23/16  Ruben Im, MD  naproxen (NAPROSYN) 500 MG tablet Take 1 tablet (500 mg total) by mouth 2 (two) times daily as needed for mild pain, moderate pain or headache (TAKE WITH MEALS.). Patient not taking: Reported  on 09/29/2016 02/01/16   Mercedes Street, PA-C  ondansetron (ZOFRAN) 4 MG tablet Take 1 tablet (4 mg total) by mouth every 6 (six) hours as needed for nausea or vomiting. 09/29/16   Lavera Guise, MD    Family History No family history on file.  Social History Social History  Substance Use Topics  . Smoking status: Current Every Day Smoker    Packs/day: 0.50    Years: 27.00    Types: Cigarettes  . Smokeless tobacco: Not on  file  . Alcohol use 0.0 oz/week     Comment: 128 oz beer daily     Allergies   Patient has no known allergies.   Review of Systems Review of Systems  Constitutional: Negative for fever.  HENT: Negative for congestion.   Respiratory: Negative for cough.   Cardiovascular: Negative for chest pain.  Gastrointestinal: Positive for abdominal pain, nausea and vomiting. Negative for constipation, diarrhea and melena.  Genitourinary: Negative for dysuria, frequency and hematuria.  Allergic/Immunologic: Negative for immunocompromised state.  Neurological: Negative for syncope.  Hematological: Does not bruise/bleed easily.  Psychiatric/Behavioral: Negative for confusion.  All other systems reviewed and are negative.    Physical Exam Updated Vital Signs BP 143/98   Pulse (!) 53   Temp 98.8 F (37.1 C) (Oral)   Resp 16   Ht 5\' 8"  (1.727 m)   Wt 171 lb (77.6 kg)   SpO2 100%   BMI 26.00 kg/m   Physical Exam  Physical Exam  Nursing note and vitals reviewed. Constitutional: Well developed, well nourished, non-toxic, and in no acute distress Head: Normocephalic and atraumatic.  Mouth/Throat: Oropharynx is clear and moist.  Neck: Normal range of motion. Neck supple.  Cardiovascular: Normal rate and regular rhythm.   Pulmonary/Chest: Effort normal and breath sounds normal.  Abdominal: Soft. There is generalized abdominal tenderness. Mild distension.There is no rebound and no guarding.  Musculoskeletal: Normal range of motion.  Neurological: Alert, no facial droop, fluent speech, moves all extremities symmetrically Skin: Skin is warm and dry.  Psychiatric: Cooperative  ED Treatments / Results  Labs (all labs ordered are listed, but only abnormal results are displayed) Labs Reviewed  COMPREHENSIVE METABOLIC PANEL - Abnormal; Notable for the following:       Result Value   BUN 5 (*)    All other components within normal limits  CBC WITH DIFFERENTIAL/PLATELET  LIPASE, BLOOD     EKG  EKG Interpretation None       Radiology Ct Abdomen Pelvis W Contrast  Result Date: 09/29/2016 CLINICAL DATA:  Nausea, vomiting, abdominal pain. EXAM: CT ABDOMEN AND PELVIS WITH CONTRAST TECHNIQUE: Multidetector CT imaging of the abdomen and pelvis was performed using the standard protocol following bolus administration of intravenous contrast. CONTRAST:  ISOVUE-300 IOPAMIDOL (ISOVUE-300) INJECTION 61% COMPARISON:  None. FINDINGS: Lower chest: Lung bases are clear. No effusions. Heart is normal size. Hepatobiliary: No focal hepatic abnormality. Gallbladder unremarkable. Pancreas: No focal abnormality or ductal dilatation. Spleen: No focal abnormality.  Normal size. Adrenals/Urinary Tract: No adrenal abnormality. No focal renal abnormality. No stones or hydronephrosis. Urinary bladder is unremarkable. Stomach/Bowel: Stomach, large and small bowel grossly unremarkable. Appendix is normal Vascular/Lymphatic: No evidence of aneurysm or adenopathy. Calcifications in the infrarenal aorta and common iliac arteries. Reproductive: No visible focal abnormality. Other: No free fluid or free air. Musculoskeletal: No acute bony abnormality. IMPRESSION: No acute findings in the abdomen or pelvis. Aortoiliac atherosclerosis. Electronically Signed   By: Charlett Nose M.D.   On:  09/29/2016 11:04    Procedures Procedures (including critical care time)  Medications Ordered in ED Medications  sodium chloride 0.9 % bolus 1,000 mL (0 mLs Intravenous Stopped 09/29/16 1114)  ondansetron (ZOFRAN) injection 4 mg (4 mg Intravenous Given 09/29/16 0854)  morphine 4 MG/ML injection 4 mg (4 mg Intravenous Given 09/29/16 0854)  iopamidol (ISOVUE-300) 61 % injection (100 mLs  Contrast Given 09/29/16 1049)     Initial Impression / Assessment and Plan / ED Course  I have reviewed the triage vital signs and the nursing notes.  Pertinent labs & imaging results that were available during my care of the patient were  reviewed by me and considered in my medical decision making (see chart for details).     Presenting with generalized abdominal pain, nausea and vomiting after eating partially cooked hamburger last night. Vitals stable, he is nontoxic. Abdomen soft and not acutely surgical but with generalized tenderness.   Suspect benign food borne GI illness. Given IVF, analgesics, and antiemetics. On re-evaluation 9:45 AM, patient stills expressing moderate constant abdominal pain, no cramping. Blood work including CBC, CMP, lipase unremarkable. Given reported persistent pain, will obtain CT to rule out early appendicitis, colitis, volvulus or other acute intraabdominal pathology.  CT visualized. No evidence of acute intra-abdominal processes. Likely benign food borne GI illness. No further vomiting in ED. Persistent nonsurgical abdomen. Felt to be safe for discharge home. Strict return and follow-up instructions reviewed. He expressed understanding of all discharge instructions and felt comfortable with the plan of care.  The patient appears reasonably screened and/or stabilized for discharge and I doubt any other medical condition or other [facility] requiring further screening, evaluation, or treatment in the ED at this time prior to discharge.     Final Clinical Impressions(s) / ED Diagnoses   Final diagnoses:  Non-intractable vomiting with nausea, unspecified vomiting type  Generalized abdominal pain    New Prescriptions New Prescriptions   ONDANSETRON (ZOFRAN) 4 MG TABLET    Take 1 tablet (4 mg total) by mouth every 6 (six) hours as needed for nausea or vomiting.     Lavera Guiseana Duo Skylynne Schlechter, MD 09/29/16 [phone]

## 2016-09-29 NOTE — Discharge Instructions (Signed)
You likely have food related Gi illness. This may take a few days to get fully better on it's own. The CT scan of your abdomen is normal. Take nausea medication as prescribed. Take OTC pain medications.  Return for worsening symptoms, including bloody stools, fever, escalating pain or any other symptoms concerning to you.

## 2017-02-06 ENCOUNTER — Encounter (HOSPITAL_COMMUNITY): Payer: Self-pay

## 2017-02-06 ENCOUNTER — Emergency Department (HOSPITAL_COMMUNITY)
Admission: EM | Admit: 2017-02-06 | Discharge: 2017-02-06 | Disposition: A | Discharge status: Home / Self Care | Payer: Self-pay | Attending: Emergency Medicine | Admitting: Emergency Medicine

## 2017-02-06 DIAGNOSIS — I10 Essential (primary) hypertension: Secondary | ICD-10-CM | POA: Insufficient documentation

## 2017-02-06 DIAGNOSIS — F17210 Nicotine dependence, cigarettes, uncomplicated: Secondary | ICD-10-CM | POA: Insufficient documentation

## 2017-02-06 DIAGNOSIS — M10071 Idiopathic gout, right ankle and foot: Secondary | ICD-10-CM | POA: Insufficient documentation

## 2017-02-06 MED ORDER — OXYCODONE-ACETAMINOPHEN 5-325 MG PO TABS
1.0000 | ORAL_TABLET | Freq: Once | ORAL | Status: AC
  Administered 2017-02-06: 1 via ORAL
  Filled 2017-02-06: qty 1

## 2017-02-06 MED ORDER — PREDNISONE 10 MG PO TABS: 40.0000 mg | ORAL_TABLET | Freq: Every day | ORAL | 0 refills | Status: AC

## 2017-02-06 MED ORDER — IBUPROFEN 200 MG PO TABS
600.0000 mg | ORAL_TABLET | Freq: Once | ORAL | Status: AC
  Administered 2017-02-06: 600 mg via ORAL
  Filled 2017-02-06: qty 3

## 2017-02-06 MED ORDER — PREDNISONE 20 MG PO TABS
60.0000 mg | ORAL_TABLET | Freq: Once | ORAL | Status: AC
  Administered 2017-02-06: 60 mg via ORAL
  Filled 2017-02-06: qty 3

## 2017-02-06 NOTE — Discharge Instructions (Signed)
Your symptoms and exam are consistent with gout. Please read attached information on gout.   The treatment for gout include anti-inflammatories (ibuprofen) and steroids (prednisone).  Please take these medications as prescribed. Avoid alcohol as this can precipitate a gout flare.   Return to ED for worsening pain, redness, swelling, warmth, fevers

## 2017-02-06 NOTE — ED Provider Notes (Signed)
[facility]-EMERGENCY DEPT Provider Note   CSN: [id number] Arrival date & time: 02/06/17  [id number]     History   Chief Complaint Chief Complaint  Patient presents with  . Foot Pain    HPI Manuel Harrison is a 46 y.o. male with h/o HTN, CVA, gout presents to ED with sudden onset left big toe pain, swelling and redness x 1 day. No associated fever. No preceding trauma. No IVDU. No h/o DM. No alleviates pain. Aggravated by touch, walking, weight baring and wiggling toes. Patient drinks heavy amounts of beer almost daily. Reports h/o gout but is not on any maintenance medications.  HPI  Past Medical History:  Diagnosis Date  . Hypertension   . Stroke [facility](HCC)     Patient Active Problem List   Diagnosis Date Noted  . Osteoarthritis of left knee 11/24/2015  . Essential hypertension 11/24/2015  . Dry cough 11/24/2015  . Seasonal allergies 11/24/2015  . Dyslipidemia 01/30/2013  . Acute ischemic stroke (HCC) 10/07/2012  . Tobacco abuse disorder 10/06/2012  . History of cocaine use 10/06/2012    History reviewed. No pertinent surgical history.     Home Medications    Prior to Admission medications   Medication Sig Start Date End Date Taking? Authorizing Provider  atorvastatin (LIPITOR) 10 MG tablet Take 1 tablet (10 mg total) by mouth daily at 6 PM. Patient not taking: Reported on 09/29/2016 11/24/15   Ruben ImFord, Jeremy, MD  diclofenac sodium (VOLTAREN) 1 % GEL Apply 4 g topically 4 (four) times daily. Patient not taking: Reported on 09/29/2016 11/24/15   Ruben ImFord, Jeremy, MD  doxycycline (VIBRAMYCIN) 100 MG capsule Take 1 capsule (100 mg total) by mouth 2 (two) times daily. One po bid x 7 days Patient not taking: Reported on 09/29/2016 02/01/16   Street, EmmaMercedes, PA-C  hydrochlorothiazide (MICROZIDE) 12.5 MG capsule Take 1 capsule (12.5 mg total) by mouth daily. Patient not taking: Reported on 09/29/2016 11/24/15 11/23/16  Ruben ImFord, Jeremy, MD  HYDROcodone-acetaminophen [facility](NORCO) 5-325 MG tablet Take 1-2 tablets by  mouth every 6 (six) hours as needed for severe pain. Patient not taking: Reported on 09/29/2016 02/01/16   Street, [place], PA-C  ibuprofen (ADVIL,MOTRIN) 200 MG tablet Take 200 mg by mouth every 6 (six) hours as needed for moderate pain.    [provider]  loratadine (CLARITIN) 10 MG tablet Take 1 tablet (10 mg total) by mouth daily. Patient not taking: Reported on 09/29/2016 11/24/15 11/23/16  Ruben ImFord, Jeremy, MD  naproxen (NAPROSYN) 500 MG tablet Take 1 tablet (500 mg total) by mouth 2 (two) times daily as needed for mild pain, moderate pain or headache (TAKE WITH MEALS.). Patient not taking: Reported on 09/29/2016 02/01/16   Street, [place], PA-C  ondansetron (ZOFRAN) 4 MG tablet Take 1 tablet (4 mg total) by mouth every 6 (six) hours as needed for nausea or vomiting. 09/29/16   Lavera GuiseLiu, Dana Duo, MD  predniSONE (DELTASONE) 10 MG tablet Take 4 tablets (40 mg total) by mouth daily. 02/06/17 02/11/17  Liberty HandyGibbons, Makaya Juneau J, PA-C    Family History History reviewed. No pertinent family history.  Social History Social History  Substance Use Topics  . Smoking status: Current Every Day Smoker    Packs/day: 0.50    Years: 27.00    Types: Cigarettes  . Smokeless tobacco: Never Used  . Alcohol use 0.0 oz/week     Comment: 128 oz beer daily     Allergies   Aleve [naproxen sodium]   Review of Systems Review of Systems  Constitutional: Negative for fever.  Musculoskeletal: Positive for arthralgias, gait problem and joint swelling.  Skin: Positive for color change.     Physical Exam Updated Vital Signs BP (!) 147/91 (BP Location: Left Arm)   Pulse 63   Temp 98 F (36.7 C) (Oral)   Resp 16   Ht 5\' 10"  (1.778 m)   Wt 77.6 kg (171 lb)   SpO2 100%   BMI 24.54 kg/m   Physical Exam  Constitutional: He is oriented to person, place, and time. He appears well-developed and well-nourished. No distress.  NAD.  HENT:  Head: Normocephalic and atraumatic.  Right Ear: External ear normal.  Left Ear:  External ear normal.  Nose: Nose normal.  Eyes: EOM are normal. No scleral icterus.  Neck: Normal range of motion.  Cardiovascular: Normal rate, regular rhythm, normal heart sounds and intact distal pulses.   No murmur heard. DP and PT pulses 2+ bilaterally Good cap refill to bilateral toes   Pulmonary/Chest: Effort normal and breath sounds normal. He has no wheezes.  Musculoskeletal: Normal range of motion. He exhibits edema and tenderness. He exhibits no deformity.  Right MTP exquisitely tender to light touch, mildly edematous without obvious erythema, warmth or overlaying skin changes  Patient is able to wiggle toes with pain  Neurological: He is alert and oriented to person, place, and time.  Sensation intact to bilateral feet  Skin: Skin is warm and dry. Capillary refill takes less than 2 seconds.  Psychiatric: He has a normal mood and affect. His behavior is normal. Judgment and thought content normal.  Nursing note and vitals reviewed.    ED Treatments / Results  Labs (all labs ordered are listed, but only abnormal results are displayed) Labs Reviewed - No data to display  EKG  EKG Interpretation None       Radiology No results found.  Procedures Procedures (including critical care time)  Medications Ordered in ED Medications  ibuprofen (ADVIL,MOTRIN) tablet 600 mg (600 mg Oral Given 02/06/17 0801)  oxyCODONE-acetaminophen (PERCOCET/ROXICET) 5-325 MG per tablet 1 tablet (1 tablet Oral Given 02/06/17 0801)  predniSONE (DELTASONE) tablet 60 mg (60 mg Oral Given 02/06/17 0801)     Initial Impression / Assessment and Plan / ED Course  I have reviewed the triage vital signs and the nursing notes.  Pertinent labs & imaging results that were available during my care of the patient were reviewed by me and considered in my medical decision making (see chart for details).     Pt presents with monoarticular pain and pain.  Pt is afebrile. No preceding trauma. No IVDU.  H/o gout to same joint. Imaging not thought to be indicated today as exam is reassuring, no previous trauma, no h/o DM, no fever.  HPI not consistent with septic arthritis, osteomyelitis, traumatic injury. Last creatinine wnl. Pt without known peptic ulcer disease and not receiving concurrent treatment on warfarin. Pt dc with ibuprofen and prednisone. Discussed that pt should respond to treatment with in 24 hour of begining treatment & likely resolve in 2-3 days. Patient given crutches, work note and gout diet info   Final Clinical Impressions(s) / ED Diagnoses   Final diagnoses:  Acute idiopathic gout involving toe of right foot    New Prescriptions New Prescriptions   PREDNISONE (DELTASONE) 10 MG TABLET    Take 4 tablets (40 mg total) by mouth daily.     Liberty Handy, PA-C 02/06/17 1610    Liberty Handy, PA-C 02/06/17 [phone]  Mancel Bale, MD 02/07/17 [phone]

## 2017-02-06 NOTE — ED Triage Notes (Signed)
Right great toe base pain since yesterday no trauma voiced states pain is getting worse no deformity noted slight swelling.

## 2017-11-10 ENCOUNTER — Encounter (HOSPITAL_COMMUNITY): Payer: Self-pay

## 2017-11-10 ENCOUNTER — Emergency Department (HOSPITAL_COMMUNITY)
Admission: EM | Admit: 2017-11-10 | Discharge: 2017-11-11 | Disposition: A | Discharge status: Home / Self Care | Payer: Self-pay | Attending: Emergency Medicine | Admitting: Emergency Medicine

## 2017-11-10 DIAGNOSIS — Y929 Unspecified place or not applicable: Secondary | ICD-10-CM | POA: Insufficient documentation

## 2017-11-10 DIAGNOSIS — Y939 Activity, unspecified: Secondary | ICD-10-CM | POA: Insufficient documentation

## 2017-11-10 DIAGNOSIS — Z23 Encounter for immunization: Secondary | ICD-10-CM | POA: Insufficient documentation

## 2017-11-10 DIAGNOSIS — Y999 Unspecified external cause status: Secondary | ICD-10-CM | POA: Insufficient documentation

## 2017-11-10 DIAGNOSIS — I10 Essential (primary) hypertension: Secondary | ICD-10-CM | POA: Insufficient documentation

## 2017-11-10 DIAGNOSIS — S61412A Laceration without foreign body of left hand, initial encounter: Secondary | ICD-10-CM | POA: Insufficient documentation

## 2017-11-10 DIAGNOSIS — F17210 Nicotine dependence, cigarettes, uncomplicated: Secondary | ICD-10-CM | POA: Insufficient documentation

## 2017-11-10 DIAGNOSIS — Z79899 Other long term (current) drug therapy: Secondary | ICD-10-CM | POA: Insufficient documentation

## 2017-11-10 DIAGNOSIS — Z8673 Personal history of transient ischemic attack (TIA), and cerebral infarction without residual deficits: Secondary | ICD-10-CM | POA: Insufficient documentation

## 2017-11-10 DIAGNOSIS — W269XXA Contact with unspecified sharp object(s), initial encounter: Secondary | ICD-10-CM | POA: Insufficient documentation
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
## 2017-11-11 MED ORDER — LIDOCAINE HCL (PF) 1 % IJ SOLN
5.0000 mL | Freq: Once | INTRAMUSCULAR | Status: AC
  Administered 2017-11-11: 5 mL via INTRADERMAL
  Filled 2017-11-11: qty 5

## 2017-11-11 MED ORDER — TETANUS-DIPHTH-ACELL PERTUSSIS 5-2.5-18.5 LF-MCG/0.5 IM SUSP
0.5000 mL | Freq: Once | INTRAMUSCULAR | Status: AC
  Administered 2017-11-11: 0.5 mL via INTRAMUSCULAR
  Filled 2017-11-11: qty 0.5

## 2017-11-11 MED ORDER — CEPHALEXIN 250 MG PO CAPS
500.0000 mg | ORAL_CAPSULE | Freq: Once | ORAL | Status: AC
  Administered 2017-11-11: 500 mg via ORAL
  Filled 2017-11-11: qty 2

## 2017-11-11 MED ORDER — OXYCODONE-ACETAMINOPHEN 5-325 MG PO TABS
1.0000 | ORAL_TABLET | ORAL | Status: DC | PRN
  Administered 2017-11-11: 1 via ORAL
  Filled 2017-11-11 (×2): qty 1

## 2017-11-11 NOTE — ED Notes (Addendum)
Patient family member was upset about seeing another patient in the lobby given a [place] sandwich. This EMT tried to explain that everyone is in the lobby for different circumstances. Patient family member was mad about not being able to have a drink for herself. I offered to get family member a drink. The family member continued to have an attitude and use derogatory language towards this EMT. I then informed her that there are water fountains and vending machines in the lobby that were accessible to her. She called me a "bitch" and a "little white bitch" because I told her I would not be getting her a drink because of her attitude towards me. Family member also informed that the patient was my responsibility and she was able to care for herself. Family member continued to call me a "bitch" as she left the lobby.
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
## 2017-11-11 NOTE — Discharge Instructions (Addendum)
For pain control you may take:  800mg  of ibuprofen (that is usually 4 over the counter pills)  3 times a day (take with food) and acetaminophen 975mg  (this is 3 over the counter pills) four times a day. Do not drink alcohol or combine with other medications that have acetaminophen as an ingredient (Read the labels!).    Keep wound dry and do not remove dressing for 24 hours if possible. After that, wash gently morning and night (every 12 hours) with soap and water. Use a topical antibiotic ointment and cover with a bandaid or gauze.    Do NOT use rubbing alcohol or hydrogen peroxide, do not soak the area   Present to your primary care doctor or the urgent care of your choice, or the ED for suture removal in 7 days.   Every attempt was made to remove foreign body (contaminants) from the wound.  However, there is always a chance that some may remain in the wound. This can  increase your risk of infection.   If you see signs of infection (warmth, redness, tenderness, pus, sharp increase in pain, fever, red streaking in the skin) immediately return to the emergency department.   After the wound heals fully, apply sunscreen for 6-12 months to minimize scarring.

## 2017-11-18 ENCOUNTER — Emergency Department (HOSPITAL_COMMUNITY)
Admission: EM | Admit: 2017-11-18 | Discharge: 2017-11-18 | Disposition: A | Discharge status: Home / Self Care | Payer: Self-pay | Attending: Emergency Medicine | Admitting: Emergency Medicine

## 2017-11-18 ENCOUNTER — Encounter (HOSPITAL_COMMUNITY): Payer: Self-pay

## 2017-11-18 DIAGNOSIS — Z8673 Personal history of transient ischemic attack (TIA), and cerebral infarction without residual deficits: Secondary | ICD-10-CM | POA: Insufficient documentation

## 2017-11-18 DIAGNOSIS — S61412D Laceration without foreign body of left hand, subsequent encounter: Secondary | ICD-10-CM | POA: Insufficient documentation

## 2017-11-18 DIAGNOSIS — X58XXXD Exposure to other specified factors, subsequent encounter: Secondary | ICD-10-CM | POA: Insufficient documentation

## 2017-11-18 DIAGNOSIS — F1210 Cannabis abuse, uncomplicated: Secondary | ICD-10-CM | POA: Insufficient documentation

## 2017-11-18 DIAGNOSIS — Z4802 Encounter for removal of sutures: Secondary | ICD-10-CM

## 2017-11-18 DIAGNOSIS — F17210 Nicotine dependence, cigarettes, uncomplicated: Secondary | ICD-10-CM | POA: Insufficient documentation

## 2017-11-18 DIAGNOSIS — I10 Essential (primary) hypertension: Secondary | ICD-10-CM | POA: Insufficient documentation

## 2017-11-18 NOTE — ED Notes (Signed)
No answer when called to come to treatment room x2

## 2017-11-18 NOTE — Discharge Instructions (Signed)
Follow up with your PCP.  Follow attached instructions  If you develop worsening or new concerning symptoms you can return to the emergency department for re-evaluation.

## 2017-11-18 NOTE — ED Notes (Addendum)
No answer when called to come to treatment room x1

## 2017-11-18 NOTE — ED Provider Notes (Signed)
[facility] EMERGENCY DEPARTMENT Provider Note   CSN: [id number] Arrival date & time: 11/18/17  0865     History   Chief Complaint Chief Complaint  Patient presents with  . Suture / Staple Removal    HPI Manuel Harrison is a 47 y.o. male who presents the emergency department today for suture removal. Patient was here 7 days ago and had laceration to his hand.  His tetanus was treated at that time.  Denies any pain.  Nothing makes symptoms better or worse.  He denies any fever, surrounding redness, drainage or dehiscence of the wound.  HPI  Past Medical History:  Diagnosis Date  . Hypertension   . Stroke [facility])     Patient Active Problem List   Diagnosis Date Noted  . Osteoarthritis of left knee 11/24/2015  . Essential hypertension 11/24/2015  . Dry cough 11/24/2015  . Seasonal allergies 11/24/2015  . Dyslipidemia 01/30/2013  . Acute ischemic stroke (HCC) 10/07/2012  . Tobacco abuse disorder 10/06/2012  . History of cocaine use 10/06/2012    History reviewed. No pertinent surgical history.      Home Medications    Prior to Admission medications   Medication Sig Start Date End Date Taking? Authorizing Provider  atorvastatin (LIPITOR) 10 MG tablet Take 1 tablet (10 mg total) by mouth daily at 6 PM. Patient not taking: Reported on 09/29/2016 11/24/15   Ruben Im, MD  diclofenac sodium (VOLTAREN) 1 % GEL Apply 4 g topically 4 (four) times daily. Patient not taking: Reported on 09/29/2016 11/24/15   Ruben Im, MD  doxycycline (VIBRAMYCIN) 100 MG capsule Take 1 capsule (100 mg total) by mouth 2 (two) times daily. One po bid x 7 days Patient not taking: Reported on 09/29/2016 02/01/16   Street, [place], PA-C  hydrochlorothiazide (MICROZIDE) 12.5 MG capsule Take 1 capsule (12.5 mg total) by mouth daily. Patient not taking: Reported on 09/29/2016 11/24/15 11/23/16  Ruben Im, MD  HYDROcodone-acetaminophen [facility]) 5-325 MG tablet Take 1-2 tablets by mouth every  6 (six) hours as needed for severe pain. Patient not taking: Reported on 09/29/2016 02/01/16   Street, [place], PA-C  ibuprofen (ADVIL,MOTRIN) 200 MG tablet Take 200 mg by mouth every 6 (six) hours as needed for moderate pain.    [provider]  loratadine (CLARITIN) 10 MG tablet Take 1 tablet (10 mg total) by mouth daily. Patient not taking: Reported on 09/29/2016 11/24/15 11/23/16  Ruben Im, MD  naproxen (NAPROSYN) 500 MG tablet Take 1 tablet (500 mg total) by mouth 2 (two) times daily as needed for mild pain, moderate pain or headache (TAKE WITH MEALS.). Patient not taking: Reported on 09/29/2016 02/01/16   Street, [place], PA-C  ondansetron (ZOFRAN) 4 MG tablet Take 1 tablet (4 mg total) by mouth every 6 (six) hours as needed for nausea or vomiting. 09/29/16   Lavera Guise, MD    Family History No family history on file.  Social History Social History   Tobacco Use  . Smoking status: Current Every Day Smoker    Packs/day: 0.50    Years: 27.00    Pack years: 13.50    Types: Cigarettes  . Smokeless tobacco: Never Used  Substance Use Topics  . Alcohol use: Yes    Alcohol/week: 0.0 oz    Comment: 128 oz beer daily  . Drug use: Yes    Types: Marijuana     Allergies   Aleve [naproxen sodium]   Review of Systems Review of Systems  All other systems reviewed and are negative.    Physical Exam Updated Vital Signs BP (!) 150/97 (BP Location: Right Arm)   Pulse 98   Temp 98.5 F (36.9 C) (Oral)   Resp 17   SpO2 99%   Physical Exam  Constitutional: He appears well-developed and well-nourished.  HENT:  Head: Normocephalic and atraumatic.  Right Ear: External ear normal.  Left Ear: External ear normal.  Eyes: Conjunctivae are normal. Right eye exhibits no discharge. Left eye exhibits no discharge. No scleral icterus.  Pulmonary/Chest: Effort normal. No respiratory distress.  Musculoskeletal:  Left hand with 2 lacerations.  One that was repaired with Steri-Strips and  appears to be healing well.  The other with running locking suture in place.  No evidence of wound dehiscence.  No drainage.  No surrounding erythema, swelling or induration.  Normal range of motion of left thumb.  No tenderness palpation along flexor extensor sheath.  Neurological: He is alert. He has normal strength. No sensory deficit.  Skin: Skin is warm and dry. Capillary refill takes less than 2 seconds. No erythema. No pallor.  Psychiatric: He has a normal mood and affect.  Nursing note and vitals reviewed.    ED Treatments / Results  Labs (all labs ordered are listed, but only abnormal results are displayed) Labs Reviewed - No data to display  EKG None  Radiology No results found.  Procedures .Suture Removal Date/Time: 11/18/2017 1:04 PM Performed by: Jacinto HalimMaczis, Lailynn Southgate M, PA-C Authorized by: Jacinto HalimMaczis, Odis Wickey M, PA-C   Consent:    Consent obtained:  Verbal   Consent given by:  Patient   Risks discussed:  Bleeding   Alternatives discussed:  No treatment Location:    Location:  Upper extremity   Upper extremity location:  Hand   Hand location:  L hand Procedure details:    Wound appearance:  No signs of infection, good wound healing and clean   Number of sutures removed:  7 Post-procedure details:    Post-removal:  Band-Aid applied   Patient tolerance of procedure:  Tolerated well, no immediate complications   (including critical care time)  Medications Ordered in ED Medications - No data to display   Initial Impression / Assessment and Plan / ED Course  I have reviewed the triage vital signs and the nursing notes.  Pertinent labs & imaging results that were available during my care of the patient were reviewed by me and considered in my medical decision making (see chart for details).     Pt to ER for staple/suture removal and wound check as above. Procedure tolerated well. Vitals normal, no signs of infection. Scar minimization & return precautions given at  dc.   Final Clinical Impressions(s) / ED Diagnoses   Final diagnoses:  Visit for suture removal    ED Discharge Orders    None       Princella PellegriniMaczis, Karlis Cregg M, PA-C 11/18/17 1305    Vanetta MuldersZackowski, Scott, MD 11/20/17 1606

## 2017-11-18 NOTE — ED Triage Notes (Signed)
patient here to have sutures removed from left thumb, in place x 1 week, no concerns

## 2018-04-25 ENCOUNTER — Inpatient Hospital Stay (HOSPITAL_COMMUNITY): Payer: Self-pay

## 2018-04-25 ENCOUNTER — Inpatient Hospital Stay (HOSPITAL_COMMUNITY)
Admission: EM | Admit: 2018-04-25 | Discharge: 2018-04-28 | DRG: 024 | Disposition: A | Discharge status: Home / Self Care | Payer: Self-pay | Attending: Neurology | Admitting: Neurology

## 2018-04-25 ENCOUNTER — Encounter (HOSPITAL_COMMUNITY): Payer: Self-pay | Admitting: Anesthesiology

## 2018-04-25 ENCOUNTER — Inpatient Hospital Stay (HOSPITAL_COMMUNITY): Payer: Self-pay | Admitting: Anesthesiology

## 2018-04-25 ENCOUNTER — Emergency Department (HOSPITAL_COMMUNITY): Payer: Self-pay

## 2018-04-25 ENCOUNTER — Encounter (HOSPITAL_COMMUNITY)
Admission: EM | Disposition: A | Discharge status: Home / Self Care | Payer: Self-pay | Source: Home / Self Care | Attending: Neurology

## 2018-04-25 DIAGNOSIS — E875 Hyperkalemia: Secondary | ICD-10-CM | POA: Diagnosis present

## 2018-04-25 DIAGNOSIS — E781 Pure hyperglyceridemia: Secondary | ICD-10-CM | POA: Diagnosis present

## 2018-04-25 DIAGNOSIS — F17210 Nicotine dependence, cigarettes, uncomplicated: Secondary | ICD-10-CM | POA: Diagnosis present

## 2018-04-25 DIAGNOSIS — I63511 Cerebral infarction due to unspecified occlusion or stenosis of right middle cerebral artery: Principal | ICD-10-CM

## 2018-04-25 DIAGNOSIS — J9601 Acute respiratory failure with hypoxia: Secondary | ICD-10-CM

## 2018-04-25 DIAGNOSIS — I10 Essential (primary) hypertension: Secondary | ICD-10-CM | POA: Diagnosis present

## 2018-04-25 DIAGNOSIS — I6601 Occlusion and stenosis of right middle cerebral artery: Secondary | ICD-10-CM | POA: Diagnosis present

## 2018-04-25 DIAGNOSIS — I639 Cerebral infarction, unspecified: Secondary | ICD-10-CM

## 2018-04-25 DIAGNOSIS — E785 Hyperlipidemia, unspecified: Secondary | ICD-10-CM | POA: Diagnosis present

## 2018-04-25 DIAGNOSIS — Z23 Encounter for immunization: Secondary | ICD-10-CM

## 2018-04-25 DIAGNOSIS — Z4659 Encounter for fitting and adjustment of other gastrointestinal appliance and device: Secondary | ICD-10-CM

## 2018-04-25 DIAGNOSIS — I63311 Cerebral infarction due to thrombosis of right middle cerebral artery: Secondary | ICD-10-CM

## 2018-04-25 DIAGNOSIS — R29810 Facial weakness: Secondary | ICD-10-CM | POA: Diagnosis present

## 2018-04-25 DIAGNOSIS — H53462 Homonymous bilateral field defects, left side: Secondary | ICD-10-CM | POA: Diagnosis present

## 2018-04-25 DIAGNOSIS — F1410 Cocaine abuse, uncomplicated: Secondary | ICD-10-CM | POA: Diagnosis present

## 2018-04-25 DIAGNOSIS — R414 Neurologic neglect syndrome: Secondary | ICD-10-CM | POA: Diagnosis present

## 2018-04-25 DIAGNOSIS — G8194 Hemiplegia, unspecified affecting left nondominant side: Secondary | ICD-10-CM | POA: Diagnosis present

## 2018-04-25 DIAGNOSIS — F1210 Cannabis abuse, uncomplicated: Secondary | ICD-10-CM | POA: Diagnosis present

## 2018-04-25 DIAGNOSIS — E7800 Pure hypercholesterolemia, unspecified: Secondary | ICD-10-CM | POA: Diagnosis present

## 2018-04-25 DIAGNOSIS — Z79899 Other long term (current) drug therapy: Secondary | ICD-10-CM

## 2018-04-25 DIAGNOSIS — R1310 Dysphagia, unspecified: Secondary | ICD-10-CM | POA: Diagnosis present

## 2018-04-25 HISTORY — PX: IR CT HEAD LTD: IMG2386

## 2018-04-25 HISTORY — DX: Cerebral infarction due to unspecified occlusion or stenosis of right middle cerebral artery: I63.511

## 2018-04-25 HISTORY — PX: IR PERCUTANEOUS ART THROMBECTOMY/INFUSION INTRACRANIAL INC DIAG ANGIO: IMG6087

## 2018-04-25 HISTORY — PX: RADIOLOGY WITH ANESTHESIA: SHX6223

## 2018-04-25 HISTORY — PX: IR INTRA CRAN STENT: IMG2345

## 2018-04-25 HISTORY — DX: Unspecified osteoarthritis, unspecified site: M19.90

## 2018-04-25 HISTORY — DX: Personal history of other diseases of the musculoskeletal system and connective tissue: Z87.39

## 2018-04-25 HISTORY — DX: Pure hypercholesterolemia, unspecified: E78.00

## 2018-04-25 LAB — DIFFERENTIAL
Abs Immature Granulocytes: 0 10*3/uL (ref 0.0–0.1)
Basophils Absolute: 0.1 10*3/uL (ref 0.0–0.1)
Basophils Relative: 1 %
Eosinophils Absolute: 0.1 10*3/uL (ref 0.0–0.7)
Eosinophils Relative: 2 %
Immature Granulocytes: 0 %
Lymphocytes Relative: 43 %
Lymphs Abs: 2.9 10*3/uL (ref 0.7–4.0)
Monocytes Absolute: 0.5 10*3/uL (ref 0.1–1.0)
Monocytes Relative: 7 %
Neutro Abs: 3.2 10*3/uL (ref 1.7–7.7)
Neutrophils Relative %: 47 %

## 2018-04-25 LAB — COMPREHENSIVE METABOLIC PANEL
ALT: 12 U/L (ref 0–44)
AST: 25 U/L (ref 15–41)
Albumin: 4 g/dL (ref 3.5–5.0)
Alkaline Phosphatase: 76 U/L (ref 38–126)
Anion gap: 7 (ref 5–15)
BUN: 9 mg/dL (ref 6–20)
CO2: 22 mmol/L (ref 22–32)
Calcium: 8.7 mg/dL — ABNORMAL LOW (ref 8.9–10.3)
Chloride: 108 mmol/L (ref 98–111)
Creatinine, Ser: 1.04 mg/dL (ref 0.61–1.24)
GFR calc Af Amer: >60 mL/min (ref >60)
GFR calc non Af Amer: >60 mL/min (ref >60)
Glucose, Bld: 136 mg/dL — ABNORMAL HIGH (ref 70–99)
Potassium: 4 mmol/L (ref 3.5–5.1)
Sodium: 137 mmol/L (ref 135–145)
Total Bilirubin: 0.8 mg/dL (ref 0.3–1.2)
Total Protein: 6.3 g/dL — ABNORMAL LOW (ref 6.5–8.1)

## 2018-04-25 LAB — CBC
HCT: 43.7 % (ref 39.0–52.0)
Hemoglobin: 15.2 g/dL (ref 13.0–17.0)
MCH: 31.9 pg (ref 26.0–34.0)
MCHC: 34.8 g/dL (ref 30.0–36.0)
MCV: 91.6 fL (ref 78.0–100.0)
Platelets: 305 10*3/uL (ref 150–400)
RBC: 4.77 MIL/uL (ref 4.22–5.81)
RDW: 12.8 % (ref 11.5–15.5)
WBC: 6.7 10*3/uL (ref 4.0–10.5)

## 2018-04-25 LAB — I-STAT CHEM 8, ED
BUN: 8 mg/dL (ref 6–20)
Calcium, Ion: 1.13 mmol/L — ABNORMAL LOW (ref 1.15–1.40)
Chloride: 105 mmol/L (ref 98–111)
Creatinine, Ser: 0.9 mg/dL (ref 0.61–1.24)
Glucose, Bld: 134 mg/dL — ABNORMAL HIGH (ref 70–99)
HCT: 43 % (ref 39.0–52.0)
Hemoglobin: 14.6 g/dL (ref 13.0–17.0)
Potassium: 3.7 mmol/L (ref 3.5–5.1)
Sodium: 141 mmol/L (ref 135–145)
TCO2: 25 mmol/L (ref 22–32)

## 2018-04-25 LAB — PROTIME-INR
INR: 0.9
Prothrombin Time: 12.1 seconds (ref 11.4–15.2)

## 2018-04-25 LAB — URINALYSIS, ROUTINE W REFLEX MICROSCOPIC
Bilirubin Urine: NEGATIVE
Glucose, UA: NEGATIVE mg/dL
Hgb urine dipstick: NEGATIVE
Ketones, ur: NEGATIVE mg/dL
Leukocytes, UA: NEGATIVE
Nitrite: NEGATIVE
Protein, ur: NEGATIVE mg/dL
Specific Gravity, Urine: 1.009 (ref 1.005–1.030)
pH: 7 (ref 5.0–8.0)

## 2018-04-25 LAB — RAPID URINE DRUG SCREEN, HOSP PERFORMED
Amphetamines: NOT DETECTED
Barbiturates: NOT DETECTED
Benzodiazepines: NOT DETECTED
Cocaine: NOT DETECTED
Opiates: NOT DETECTED
Tetrahydrocannabinol: NOT DETECTED

## 2018-04-25 LAB — ETHANOL: Alcohol, Ethyl (B): <10 mg/dL (ref <10)

## 2018-04-25 LAB — GLUCOSE, CAPILLARY: Glucose-Capillary: 97 mg/dL (ref 70–99)

## 2018-04-25 LAB — POCT I-STAT 3, ART BLOOD GAS (G3+)
Acid-base deficit: 2 mmol/L (ref 0.0–2.0)
Bicarbonate: 24.2 mmol/L (ref 20.0–28.0)
O2 Saturation: 100 %
Patient temperature: 97.5
TCO2: 26 mmol/L (ref 22–32)
pCO2 arterial: 43.5 mmHg (ref 32.0–48.0)
pH, Arterial: 7.351 (ref 7.350–7.450)
pO2, Arterial: 484 mmHg — ABNORMAL HIGH (ref 83.0–108.0)

## 2018-04-25 LAB — LIPID PANEL
Cholesterol: 139 mg/dL (ref 0–200)
HDL: 33 mg/dL — ABNORMAL LOW (ref >40)
LDL Cholesterol: UNDETERMINED mg/dL (ref 0–99)
Total CHOL/HDL Ratio: 4.2 RATIO
Triglycerides: 790 mg/dL — ABNORMAL HIGH (ref <150)
VLDL: UNDETERMINED mg/dL (ref 0–40)

## 2018-04-25 LAB — ECHOCARDIOGRAM COMPLETE: Height: 68 in

## 2018-04-25 LAB — I-STAT TROPONIN, ED: Troponin i, poc: 0 ng/mL (ref 0.00–0.08)

## 2018-04-25 LAB — MRSA PCR SCREENING: MRSA by PCR: NEGATIVE

## 2018-04-25 LAB — APTT: aPTT: 35 seconds (ref 24–36)

## 2018-04-25 SURGERY — RADIOLOGY WITH ANESTHESIA
Anesthesia: General

## 2018-04-25 MED ORDER — TICAGRELOR 90 MG PO TABS
90.0000 mg | ORAL_TABLET | Freq: Two times a day (BID) | ORAL | Status: DC
  Administered 2018-04-26 – 2018-04-28 (×5): 90 mg via ORAL
  Filled 2018-04-25 (×5): qty 1

## 2018-04-25 MED ORDER — TICAGRELOR 90 MG PO TABS
ORAL_TABLET | ORAL | Status: AC
  Administered 2018-04-25: 180 mg via NASOGASTRIC
  Filled 2018-04-25: qty 2

## 2018-04-25 MED ORDER — ACETAMINOPHEN 160 MG/5ML PO SOLN: 650.0000 mg | ORAL | Status: DC | PRN

## 2018-04-25 MED ORDER — FENTANYL CITRATE (PF) 250 MCG/5ML IJ SOLN
INTRAMUSCULAR | Status: DC | PRN
  Administered 2018-04-25: 100 ug via INTRAVENOUS

## 2018-04-25 MED ORDER — SODIUM CHLORIDE 0.9 % IV SOLN
INTRAVENOUS | Status: DC | PRN
  Administered 2018-04-25: 15 ug/min via INTRAVENOUS

## 2018-04-25 MED ORDER — FAMOTIDINE IN NACL 20-0.9 MG/50ML-% IV SOLN: 20.0000 mg | Freq: Two times a day (BID) | INTRAVENOUS | Status: DC

## 2018-04-25 MED ORDER — LIDOCAINE HCL 1 % IJ SOLN
INTRAMUSCULAR | Status: AC
  Filled 2018-04-25: qty 20

## 2018-04-25 MED ORDER — SODIUM CHLORIDE 0.9 % IJ SOLN
1.5000 mg | INTRAVENOUS | Status: AC
  Administered 2018-04-25: 25 ug via INTRA_ARTERIAL

## 2018-04-25 MED ORDER — ACETAMINOPHEN 325 MG PO TABS
650.0000 mg | ORAL_TABLET | ORAL | Status: DC | PRN
  Administered 2018-04-25: 650 mg via ORAL
  Filled 2018-04-25: qty 2

## 2018-04-25 MED ORDER — CEFAZOLIN SODIUM-DEXTROSE 2-4 GM/100ML-% IV SOLN
INTRAVENOUS | Status: AC
  Filled 2018-04-25: qty 100

## 2018-04-25 MED ORDER — AMLODIPINE BESYLATE 5 MG PO TABS
5.0000 mg | ORAL_TABLET | Freq: Every day | ORAL | Status: DC
  Administered 2018-04-25 – 2018-04-28 (×4): 5 mg via ORAL
  Filled 2018-04-25 (×4): qty 1

## 2018-04-25 MED ORDER — ASPIRIN 325 MG PO TABS
ORAL_TABLET | ORAL | Status: AC
  Filled 2018-04-25: qty 1

## 2018-04-25 MED ORDER — ASPIRIN 81 MG PO CHEW
81.0000 mg | CHEWABLE_TABLET | Freq: Every day | ORAL | Status: DC
  Administered 2018-04-25 – 2018-04-28 (×4): 81 mg via ORAL
  Filled 2018-04-25 (×3): qty 1

## 2018-04-25 MED ORDER — VERAPAMIL HCL 2.5 MG/ML IV SOLN
INTRAVENOUS | Status: AC
  Administered 2018-04-25: 0.75 mg via INTRA_ARTERIAL
  Administered 2018-04-25: 2.5 mg via INTRA_ARTERIAL
  Administered 2018-04-25: 1.75 mg via INTRA_ARTERIAL
  Filled 2018-04-25: qty 2

## 2018-04-25 MED ORDER — SODIUM CHLORIDE 0.9 % IV SOLN
INTRAVENOUS | Status: DC
  Administered 2018-04-25: 10:00:00 via INTRAVENOUS

## 2018-04-25 MED ORDER — SUCCINYLCHOLINE CHLORIDE 20 MG/ML IJ SOLN
INTRAMUSCULAR | Status: DC | PRN
  Administered 2018-04-25: 120 mg via INTRAVENOUS

## 2018-04-25 MED ORDER — CEFAZOLIN SODIUM-DEXTROSE 2-3 GM-%(50ML) IV SOLR
INTRAVENOUS | Status: DC | PRN
  Administered 2018-04-25: 2 g via INTRAVENOUS

## 2018-04-25 MED ORDER — ACETAMINOPHEN 325 MG PO TABS: 650.0000 mg | ORAL_TABLET | ORAL | Status: DC | PRN

## 2018-04-25 MED ORDER — LABETALOL HCL 5 MG/ML IV SOLN
INTRAVENOUS | Status: DC | PRN
  Administered 2018-04-25 (×2): 5 mg via INTRAVENOUS
  Administered 2018-04-25: 10 mg via INTRAVENOUS

## 2018-04-25 MED ORDER — LABETALOL HCL 5 MG/ML IV SOLN
5.0000 mg | INTRAVENOUS | Status: DC | PRN
  Administered 2018-04-25 (×4): 10 mg via INTRAVENOUS
  Filled 2018-04-25 (×4): qty 4

## 2018-04-25 MED ORDER — ONDANSETRON HCL 4 MG/2ML IJ SOLN
4.0000 mg | Freq: Four times a day (QID) | INTRAMUSCULAR | Status: DC | PRN
  Administered 2018-04-28: 4 mg via INTRAVENOUS

## 2018-04-25 MED ORDER — ROCURONIUM BROMIDE 100 MG/10ML IV SOLN
INTRAVENOUS | Status: DC | PRN
  Administered 2018-04-25 (×3): 50 mg via INTRAVENOUS

## 2018-04-25 MED ORDER — ACETAMINOPHEN 650 MG RE SUPP: 650.0000 mg | RECTAL | Status: DC | PRN

## 2018-04-25 MED ORDER — IOPAMIDOL (ISOVUE-370) INJECTION 76%
50.0000 mL | Freq: Once | INTRAVENOUS | Status: AC | PRN
  Administered 2018-04-25: 40 mL via INTRAVENOUS

## 2018-04-25 MED ORDER — NITROGLYCERIN 1 MG/10 ML FOR IR/CATH LAB
INTRA_ARTERIAL | Status: AC
  Administered 2018-04-25 (×2): 25 ug via INTRA_ARTERIAL
  Administered 2018-04-25: 50 ug via INTRA_ARTERIAL
  Administered 2018-04-25 (×2): 25 ug via INTRA_ARTERIAL
  Filled 2018-04-25: qty 10

## 2018-04-25 MED ORDER — EPTIFIBATIDE 20 MG/10ML IV SOLN
INTRAVENOUS | Status: AC
  Administered 2018-04-25: 1.8 mg via INTRA_ARTERIAL
  Administered 2018-04-25: 1.5 mg via INTRA_ARTERIAL
  Filled 2018-04-25: qty 10

## 2018-04-25 MED ORDER — LIDOCAINE HCL (CARDIAC) PF 100 MG/5ML IV SOSY
PREFILLED_SYRINGE | INTRAVENOUS | Status: DC | PRN
  Administered 2018-04-25: 100 mg via INTRATRACHEAL

## 2018-04-25 MED ORDER — CLEVIDIPINE BUTYRATE 0.5 MG/ML IV EMUL
1.0000 mg/h | INTRAVENOUS | Status: DC
  Administered 2018-04-25: 21 mg/h via INTRAVENOUS
  Administered 2018-04-25: 1 mg/h via INTRAVENOUS

## 2018-04-25 MED ORDER — LABETALOL HCL 5 MG/ML IV SOLN: 5.0000 mg | INTRAVENOUS | Status: DC | PRN

## 2018-04-25 MED ORDER — TICAGRELOR 90 MG PO TABS
180.0000 mg | ORAL_TABLET | Freq: Once | ORAL | Status: AC
  Administered 2018-04-25: 180 mg via ORAL

## 2018-04-25 MED ORDER — HYDRALAZINE HCL 20 MG/ML IJ SOLN
INTRAMUSCULAR | Status: AC
  Administered 2018-04-25: 20 mg via INTRAVENOUS
  Filled 2018-04-25: qty 2

## 2018-04-25 MED ORDER — SODIUM CHLORIDE 0.9 % IJ SOLN
INTRAVENOUS | Status: AC | PRN
  Administered 2018-04-25 (×2): 12.5 ug via INTRA_ARTERIAL

## 2018-04-25 MED ORDER — EPTIFIBATIDE 20 MG/10ML IV SOLN
INTRAVENOUS | Status: AC | PRN
  Administered 2018-04-25 (×2): 1.5 mg via INTRAVENOUS

## 2018-04-25 MED ORDER — FENTANYL CITRATE (PF) 100 MCG/2ML IJ SOLN
INTRAMUSCULAR | Status: AC
  Filled 2018-04-25: qty 2

## 2018-04-25 MED ORDER — PROPOFOL 10 MG/ML IV BOLUS
INTRAVENOUS | Status: DC | PRN
  Administered 2018-04-25 (×2): 100 mg via INTRAVENOUS

## 2018-04-25 MED ORDER — PROPOFOL 500 MG/50ML IV EMUL
INTRAVENOUS | Status: DC | PRN
  Administered 2018-04-25: 50 ug/kg/min via INTRAVENOUS

## 2018-04-25 MED ORDER — SODIUM CHLORIDE 0.9 % IV SOLN
INTRAVENOUS | Status: DC | PRN
  Administered 2018-04-25: 05:00:00 via INTRAVENOUS

## 2018-04-25 MED ORDER — SUGAMMADEX SODIUM 200 MG/2ML IV SOLN
INTRAVENOUS | Status: DC | PRN
  Administered 2018-04-25: 200 mg via INTRAVENOUS

## 2018-04-25 MED ORDER — PROPOFOL 500 MG/50ML IV EMUL
INTRAVENOUS | Status: AC
  Filled 2018-04-25: qty 50

## 2018-04-25 MED ORDER — HYDRALAZINE HCL 20 MG/ML IJ SOLN
10.0000 mg | INTRAMUSCULAR | Status: DC | PRN
  Administered 2018-04-25 (×2): 20 mg via INTRAVENOUS

## 2018-04-25 MED ORDER — SODIUM CHLORIDE 0.9 % IV SOLN
INTRAVENOUS | Status: DC
  Administered 2018-04-25: 08:00:00 via INTRAVENOUS

## 2018-04-25 MED ORDER — IOHEXOL 300 MG/ML  SOLN
150.0000 mL | Freq: Once | INTRAMUSCULAR | Status: AC | PRN
  Administered 2018-04-25: 75 mL via INTRA_ARTERIAL

## 2018-04-25 MED ORDER — CLEVIDIPINE BUTYRATE 0.5 MG/ML IV EMUL
0.0000 mg/h | INTRAVENOUS | Status: DC
  Administered 2018-04-25 (×8): 21 mg/h via INTRAVENOUS
  Filled 2018-04-25: qty 100
  Filled 2018-04-25: qty 150
  Filled 2018-04-25: qty 100
  Filled 2018-04-25: qty 50

## 2018-04-25 MED ORDER — ALBUTEROL SULFATE (2.5 MG/3ML) 0.083% IN NEBU: 2.5000 mg | INHALATION_SOLUTION | RESPIRATORY_TRACT | Status: DC | PRN

## 2018-04-25 MED ORDER — ASPIRIN 81 MG PO CHEW
81.0000 mg | CHEWABLE_TABLET | Freq: Once | ORAL | Status: AC
  Filled 2018-04-25: qty 1

## 2018-04-25 MED ORDER — ASPIRIN 81 MG PO CHEW: 81.0000 mg | CHEWABLE_TABLET | Freq: Every day | ORAL | Status: DC

## 2018-04-25 MED ORDER — ASPIRIN 81 MG PO CHEW
CHEWABLE_TABLET | ORAL | Status: AC
  Administered 2018-04-25: 81 mg via NASOGASTRIC
  Filled 2018-04-25: qty 1

## 2018-04-25 MED ORDER — CLEVIDIPINE BUTYRATE 0.5 MG/ML IV EMUL
INTRAVENOUS | Status: AC
  Filled 2018-04-25: qty 50

## 2018-04-25 MED ORDER — STROKE: EARLY STAGES OF RECOVERY BOOK
Freq: Once | Status: AC
  Administered 2018-04-25: 10:00:00
  Filled 2018-04-25: qty 1

## 2018-04-25 MED ORDER — TIROFIBAN HCL IN NACL 5-0.9 MG/100ML-% IV SOLN
INTRAVENOUS | Status: AC
  Filled 2018-04-25: qty 100

## 2018-04-25 MED ORDER — TICAGRELOR 90 MG PO TABS: 90.0000 mg | ORAL_TABLET | Freq: Two times a day (BID) | ORAL | Status: DC

## 2018-04-25 MED ORDER — CLOPIDOGREL BISULFATE 300 MG PO TABS
ORAL_TABLET | ORAL | Status: AC
  Filled 2018-04-25: qty 1

## 2018-04-25 MED ORDER — TICAGRELOR 90 MG PO TABS
180.0000 mg | ORAL_TABLET | Freq: Once | ORAL | Status: DC
  Filled 2018-04-25: qty 2

## 2018-04-25 MED ORDER — IOPAMIDOL (ISOVUE-370) INJECTION 76%
50.0000 mL | Freq: Once | INTRAVENOUS | Status: AC | PRN
  Administered 2018-04-25: 50 mL via INTRAVENOUS

## 2018-04-25 NOTE — Progress Notes (Signed)
PT Cancellation Note  Patient Details Name: Manuel Harrison MRN: [id number] DOB: Oct 22, 1970   Cancelled Treatment:    Reason Eval/Treat Not Completed: Active bedrest order Pt currently on bed rest. Will follow up as activity orders updated and as schedule allows.   Gladys Damme, PT, DPT  Acute Rehabilitation Services  Pager: [phone] Office: [phone]  Lehman Prom 04/25/2018, 11:21 AM

## 2018-04-25 NOTE — Anesthesia Procedure Notes (Signed)
Arterial Line Insertion Start/End10/07/2017 5:30 AM, 04/25/2018 5:37 AM Performed by: Claudina Lick, CRNA, CRNA  Preanesthetic checklist: patient identified, IV checked, risks and benefits discussed, monitors and equipment checked, pre-op evaluation and timeout performed Left, radial was placed Catheter size: 20 G Hand hygiene performed  and maximum sterile barriers used   Attempts: 1 Procedure performed without using ultrasound guided technique. Ultrasound Notes:anatomy identified, needle tip was noted to be adjacent to the nerve/plexus identified and no ultrasound evidence of intravascular and/or intraneural injection Following insertion, dressing applied. Post procedure assessment: normal  Patient tolerated the procedure well with no immediate complications.

## 2018-04-25 NOTE — Code Documentation (Signed)
Code Stroke  LSN - after talking to the patient's girlfriend was 2030. Per girlfriend, patient awoke from earlier at 7:30 with LT sided weakness and facial droop, symptoms resolve and patient went to bed to 2030. Patient's mother arrived at 2330 and per her patient was not acting "normal". Family noticed at 0400 that patient was not moving the left side and facial droop was present.   On arrival, patient was taken for STAT CT Head. NIH was 23. 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 NVIR MD was called by Neuro MD and IR team activated at 435. Patient was prepped for IR in CT, foley was placed, pulses marked, and groins clipped. CTP was ordered since LSN was 1930 (originally, we were told 2330). CTP - RIGHT frontoparietal/MCA territory brain at risk without core infarct. Patient was taken to IR at 0505.  I was called away to an emergency but ED RN and Neuro MD took patient to IR. NO IV TPA - out of window.   Start Time 0425 End Time 0505

## 2018-04-25 NOTE — Anesthesia Postprocedure Evaluation (Signed)
Anesthesia Post Note  Patient: Manuel Harrison  Procedure(s) Performed: RADIOLOGY WITH ANESTHESIA (N/A )     Patient location during evaluation: ICU Anesthesia Type: General Level of consciousness: awake Pain management: pain level controlled Vital Signs Assessment: post-procedure vital signs reviewed and stable Respiratory status: spontaneous breathing, nonlabored ventilation, respiratory function stable and patient connected to nasal cannula oxygen Cardiovascular status: blood pressure returned to baseline and stable Postop Assessment: no apparent nausea or vomiting Anesthetic complications: no    Last Vitals:  Vitals:   04/25/18 1230 04/25/18 1300  BP: 114/75 115/73  Pulse: 98 94  Resp: (!) 26 (!) 26  Temp:    SpO2: 100% 100%    Last Pain:  Vitals:   04/25/18 1309  TempSrc:   PainSc: 8     LLE Motor Response: Purposeful movement;Responds to commands (04/25/18 1300)   RLE Motor Response: Purposeful movement;Responds to commands (04/25/18 1300)        Ryan P Ellender

## 2018-04-25 NOTE — ED Triage Notes (Addendum)
Pt BIB [facility] d/t to code stroke. Pt LKW 04/24/18 at 2030. Per family early afternoon, Pt had L. Sided weakness that improved. Around 2330, L. Sided weakness started, then this AM Pt awaken  w/ slurred speech, R. Sided gaze and L.sided deficits.

## 2018-04-25 NOTE — Procedures (Signed)
Extubation Procedure Note  Patient Details:   Name: Manuel Harrison DOB: 1971-03-28 MRN: [id number]   Airway Documentation:    Vent end date: 04/25/18 Vent end time: 1058   Evaluation  O2 sats: stable throughout Complications: No apparent complications Patient did tolerate procedure well. Bilateral Breath Sounds: Rhonchi   Yes   Pt placed on  3 L with humidity, no stridor noted.  Pt able to reach 750 mL with incentive spirometer.  Manuel Harrison Sharne Linders 04/25/2018, 11:06 AM

## 2018-04-25 NOTE — Progress Notes (Signed)
Patient ID: Manuel Harrison, male   DOB: 1971-04-22, 47 y.o.   MRN: [id number] INR. 46 Y RT H M mrSS 0  LSW ? 830 pm  New onset Lt sided weakness ,and Lt facial droop. RT gaze deviation. CT brain NO ICH ASPECTS 9. CTA occluded RT MCA M1 seg  Rapid CBF< 30  Vol 0 Tmax > 6.0 s 52 ml Mismatch of 52 ML Endovascular revascularization D/W mother and girl friend. Procedure ,reasons risks alternatives reviewed. Risks of ICH of 10 % ,worsening neuro deficit,vent dependency ,inability to revascularize , vascular injury all reviewed.They expressed understanding and consented to endovascular treatment. [name] S.Deveshwaar MD

## 2018-04-25 NOTE — Progress Notes (Signed)
Transferred pt to ICU, CRNA assisted with transport. Pt intubated. Bedside report given. Dressing level zero.

## 2018-04-25 NOTE — H&P (Signed)
Chief Complaint:  Left side weakness, neglect  History obtained from: Patient, girlfriend, EMS  and Chart     HPI:                                                                                                                                       Manuel Harrison is an 47 y.o. male with past medical history of left MCA stroke, substance abuse (cocaine), smoker, hypertension presents with acute onset left-sided plegia, gaze deviation.  He was brought by EMS as a stroke alert.   Initially last seen normal was stated to be 11:30 PM, however patient's girlfriend said that patient had an episode of left-sided weakness and facial droop at 7:30 PM lasting for about 5 minutes.  Following which she was in the porch and ate dinner.  Went back to sleep at 8:30 PM.  When his mother arrived at 11:30 PM patient was not acting right however, however did not notice any obvious left-sided weakness or facial droop.  Arm 4 AM to go from woke up she noticed patient was not moving the left side and the facial droop and called 9 11.  On arrival, exam is consistent with a right MCA stroke.  A stat CT scan was obtained which showed a hyperdense right MCA and CT angiogram performed confirmed a right M1 cut off.  CT angiogram also shows a old left MCA occlusion as well.  CT perfusion was performed which shows no core approximately 50 cc penumbra. Patient taken to emergent Neuro IR.    Date last known well: 9.30.19 Time last known well: 8.30 pm ( changed from 11.30 pm after speaking with girlfriend) tPA Given: no, outside window Baseline MRS 0     Past Medical History:  Diagnosis Date  . Hypertension   . Stroke [facility])     No past surgical history on file.  No family history on file. Social History:  reports that he has been smoking cigarettes. He has a 13.50 pack-year smoking history. He has never used smokeless tobacco. He reports that he drinks alcohol. He reports that he has current or past drug  history. Drug: Marijuana.  Allergies:  Allergies  Allergen Reactions  . Aleve [Naproxen Sodium]     Medications:  I reviewed home medications   ROS:                                                                                                                                     14 systems reviewed and negative except above    Examination:                                                                                                      General: Appears well-developed  Psych: Affect appropriate to situation Eyes: No scleral injection HENT: No OP obstrucion Head: Normocephalic.  Cardiovascular: Normal rate and regular rhythm.  Respiratory: Effort normal and breath sounds normal to anterior ascultation GI: Soft.  No distension. There is no tenderness.  Skin: WDI    Neurological Examination Mental Status: Alert, oriented, thought content appropriate.  Speech fluent without evidence of aphasia. Mildly dysarthric. Able to follow 3 step commands without difficulty. Cranial Nerves: II: Visual fields : left homonymous hemianopsia III,IV, VI: ptosis not present, gaze deviation towards right side, able to cross midline V,VII: left facial droop VIII: hearing normal bilaterally IX,X: uvula rises symmetrically XI: bilateral shoulder shrug XII: midline tongue extension Motor: Right : Upper extremity   5/5    Left:     Upper extremity   0/5  Lower extremity   5/5     Lower extremity   0/5 Tone and bulk:normal tone throughout; no atrophy noted Sensory: sensory neglect on left side Deep Tendon Reflexes: 2+ and symmetric throughout Plantars: Right: downgoing   Left: downgoing Cerebellar: normal finger-to-nose on right side, unable to test on left side Gait: unable to assess     Lab Results: Basic Metabolic Panel: Recent Labs  Lab 04/25/18 0422 04/25/18 0433   NA 137 141  K 4.0 3.7  CL 108 105  CO2 22  --   GLUCOSE 136* 134*  BUN 9 8  CREATININE 1.04 0.90  CALCIUM 8.7*  --     CBC: Recent Labs  Lab 04/25/18 0422 04/25/18 0433  WBC 6.7  --   NEUTROABS 3.2  --   HGB 15.2 14.6  HCT 43.7 43.0  MCV 91.6  --   PLT 305  --     Coagulation Studies: Recent Labs    04/25/18 0422  LABPROT 12.1  INR 0.90    Imaging: Ct Angio Head W Or Wo Contrast  Result Date: 04/25/2018 CLINICAL DATA:  LEFT-sided weakness, slurred speech. Last seen normal at 2330 hours. Follow up code stroke.  EXAM: CT ANGIOGRAPHY HEAD AND NECK TECHNIQUE: Multidetector CT imaging of the head and neck was performed using the standard protocol during bolus administration of intravenous contrast. Multiplanar CT image reconstructions and MIPs were obtained to evaluate the vascular anatomy. Carotid stenosis measurements (when applicable) are obtained utilizing NASCET criteria, using the distal internal carotid diameter as the denominator. CONTRAST:  50mL ISOVUE-370 IOPAMIDOL (ISOVUE-370) INJECTION 76% COMPARISON:  MRA head October 06, 2012 and CT HEAD April 25, 2018. FINDINGS: CTA NECK FINDINGS: AORTIC ARCH: Normal appearance of the thoracic arch, 2 vessel arch is a normal variant. The origins of the innominate, left Common carotid artery and subclavian artery are widely patent. RIGHT CAROTID SYSTEM: Common carotid artery is patent, extrinsic compression by laryngeal cartilage. Normal appearance of the carotid bifurcation without hemodynamically significant stenosis by NASCET criteria. Mild luminal irregularity proximal internal carotid artery most compatible with atherosclerosis. Tonsillar loop. LEFT CAROTID SYSTEM: Common carotid artery is patent. Normal appearance of the carotid bifurcation without hemodynamically significant stenosis by NASCET criteria. Normal appearance of the internal carotid artery. VERTEBRAL ARTERIES:RIGHT vertebral artery is dominant. Normal appearance of the  vertebral arteries, widely patent. SKELETON: No acute osseous process though bone windows have not been submitted. Poor dentition. Moderate cervical spondylosis. OTHER NECK: Soft tissues of the neck are nonacute though, not tailored for evaluation. UPPER CHEST: Included lung apices are clear. No superior mediastinal lymphadenopathy. CTA HEAD FINDINGS: ANTERIOR CIRCULATION: Patent cervical internal carotid arteries, petrous, cavernous and supra clinoid internal carotid arteries. Patent anterior communicating artery. Occluded distal RIGHT M1 segment with intermediate collateralization. Chronically occluded LEFT MCA with Intermedia collaterals. No large vessel occlusion, significant stenosis, contrast extravasation or aneurysm. POSTERIOR CIRCULATION: Patent vertebral arteries, vertebrobasilar junction and basilar artery, as well as main branch vessels. Patent posterior cerebral arteries. Robust bilateral posterior communicating arteries present. No large vessel occlusion, significant stenosis, contrast extravasation or aneurysm. VENOUS SINUSES: Major dural venous sinuses are patent though not tailored for evaluation on this angiographic examination. ANATOMIC VARIANTS: None. DELAYED PHASE: Not performed. MIP images reviewed. IMPRESSION: CTA NECK: 1. No hemodynamically significant stenosis ICA's. 2. Patent vertebral arteries. CTA HEAD: 1. Emergent RIGHT M1 occlusion with intermediate collaterals. 2. 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 Critical Value/emergent results text paged to Dr.SUSHANTH AROOR via AMION secure system on 04/25/2018 at 4:40 am, including interpreting physician's phone number. Electronically Signed   By: Awilda Metro M.D.   On: 04/25/2018 04:48   Ct Angio Neck W Or Wo Contrast  Result Date: 04/25/2018 CLINICAL DATA:  LEFT-sided weakness, slurred speech. Last seen normal at 2330 hours. Follow up code stroke. EXAM: CT ANGIOGRAPHY HEAD AND NECK TECHNIQUE: Multidetector CT  imaging of the head and neck was performed using the standard protocol during bolus administration of intravenous contrast. Multiplanar CT image reconstructions and MIPs were obtained to evaluate the vascular anatomy. Carotid stenosis measurements (when applicable) are obtained utilizing NASCET criteria, using the distal internal carotid diameter as the denominator. CONTRAST:  50mL ISOVUE-370 IOPAMIDOL (ISOVUE-370) INJECTION 76% COMPARISON:  MRA head October 06, 2012 and CT HEAD April 25, 2018. FINDINGS: CTA NECK FINDINGS: AORTIC ARCH: Normal appearance of the thoracic arch, 2 vessel arch is a normal variant. The origins of the innominate, left Common carotid artery and subclavian artery are widely patent. RIGHT CAROTID SYSTEM: Common carotid artery is patent, extrinsic compression by laryngeal cartilage. Normal appearance of the carotid bifurcation without hemodynamically significant stenosis by NASCET criteria. Mild luminal irregularity proximal internal carotid artery most compatible with atherosclerosis. Tonsillar loop. LEFT  CAROTID SYSTEM: Common carotid artery is patent. Normal appearance of the carotid bifurcation without hemodynamically significant stenosis by NASCET criteria. Normal appearance of the internal carotid artery. VERTEBRAL ARTERIES:RIGHT vertebral artery is dominant. Normal appearance of the vertebral arteries, widely patent. SKELETON: No acute osseous process though bone windows have not been submitted. Poor dentition. Moderate cervical spondylosis. OTHER NECK: Soft tissues of the neck are nonacute though, not tailored for evaluation. UPPER CHEST: Included lung apices are clear. No superior mediastinal lymphadenopathy. CTA HEAD FINDINGS: ANTERIOR CIRCULATION: Patent cervical internal carotid arteries, petrous, cavernous and supra clinoid internal carotid arteries. Patent anterior communicating artery. Occluded distal RIGHT M1 segment with intermediate collateralization. Chronically occluded LEFT  MCA with Intermedia collaterals. No large vessel occlusion, significant stenosis, contrast extravasation or aneurysm. POSTERIOR CIRCULATION: Patent vertebral arteries, vertebrobasilar junction and basilar artery, as well as main branch vessels. Patent posterior cerebral arteries. Robust bilateral posterior communicating arteries present. No large vessel occlusion, significant stenosis, contrast extravasation or aneurysm. VENOUS SINUSES: Major dural venous sinuses are patent though not tailored for evaluation on this angiographic examination. ANATOMIC VARIANTS: None. DELAYED PHASE: Not performed. MIP images reviewed. IMPRESSION: CTA NECK: 1. No hemodynamically significant stenosis ICA's. 2. Patent vertebral arteries. CTA HEAD: 1. Emergent RIGHT M1 occlusion with intermediate collaterals. 2. 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 Critical Value/emergent results text paged to Dr.SUSHANTH AROOR via AMION secure system on 04/25/2018 at 4:40 am, including interpreting physician's phone number. Electronically Signed   By: Awilda Metro M.D.   On: 04/25/2018 04:48   Ct Cerebral Perfusion W Contrast  Result Date: 04/25/2018 CLINICAL DATA:  Follow up RIGHT M1 occlusion. LEFT-sided deficits, slurred speech. EXAM: CT PERFUSION BRAIN TECHNIQUE: Multiphase CT imaging of the brain was performed following IV bolus contrast injection. Subsequent parametric perfusion maps were calculated using RAPID software. CONTRAST:  40mL ISOVUE-370 IOPAMIDOL (ISOVUE-370) INJECTION 76% COMPARISON:  CT angiogram head April 25, 2018 at 0435 hours. FINDINGS: Moderately motion degraded examination. CBF (<30%) Volume: 0mL Perfusion (Tmax>6.0s) volume: 52mL Mismatch Volume: 52mL ASPECTS on noncontrast CT Head: 9 at 0426 hours today. Infarct Core: 0 mL Infarction Location:RIGHT frontal parietal lobes; limited is assessment due to ipsilateral RIGHT MCA sampling, with chronic LEFT M1 occlusion. IMPRESSION: 1. RIGHT  frontoparietal/MCA territory brain at risk without core infarct though, limited by patient motion and bilateral occluded MCA. Electronically Signed   By: Awilda Metro M.D.   On: 04/25/2018 05:12   Ct Head Code Stroke Wo Contrast  Result Date: 04/25/2018 CLINICAL DATA:  Code stroke. LEFT-sided weakness, slurred speech. History of stroke and hypertension. EXAM: CT HEAD WITHOUT CONTRAST TECHNIQUE: Contiguous axial images were obtained from the base of the skull through the vertex without intravenous contrast. COMPARISON:  MRI head October 06, 2012 FINDINGS: BRAIN: No intraparenchymal hemorrhage, mass effect nor midline shift. Old LEFT basal ganglia infarct with ex vacuo dilatation LEFT lateral ventricle. Focal blurring RIGHT frontal gray-white matter junction. No abnormal extra-axial fluid collections. Basal cisterns are patent. VASCULAR: Mildly dense RIGHT MCA versus artifact. SKULL/SOFT TISSUES: No skull fracture. No significant soft tissue swelling. ORBITS/SINUSES: The included ocular globes and orbital contents are normal.Trace paranasal sinus mucosal thickening. Mastoid air cells are well aerated. OTHER: None. ASPECTS [facility] Stroke Program Early CT Score) - Ganglionic level infarction (caudate, lentiform nuclei, internal capsule, insula, M1-M3 cortex): 6 - Supraganglionic infarction (M4-M6 cortex): 3 Total score (0-10 with 10 being normal): 9 IMPRESSION: 1. Mildly dense RIGHT MCA concerning for thromboembolism. 2. Small age-indeterminate RIGHT frontal infarct, new from 2014. 3.  Old LEFT basal ganglia infarct. 4. ASPECTS is 9. 5. Critical Value/emergent results text paged to Dr.AROOR, Neurology via AMION secure system on 04/25/2018 at 4:36 am, including interpreting physician's phone number. Electronically Signed   By: Awilda Metro M.D.   On: 04/25/2018 04:37     ASSESSMENT AND PLAN   47 y.o. male with past medical history of left MCA stroke, substance abuse (cocaine), smoker, hypertension  presents with acute right MCA stroke.  Outside the window for TPA.  CT angiogram showed either right M1 occlusion due to thrombus versus possible vasculopathy.  The perforation interestingly showed no core, however large penumbra 50 cc.  I think vasculopathy, possibly cocaine induced is a possibility, is because of similar finding in the left MCA that occurred in 2014.  Stroke work-up performed then including hypercoagulable panel was negative.  UDS was positive for cocaine and THC. No h/o of sickle cell disease.    Right MCA Acute Ischemic Stroke 2/2 Right M1 occlusion vs vasospasm   Recommend # Admit to ICU after diagnostic angio and possible mechanical thrombectomy  # MRI of the brain without contrast #Transthoracic Echo  # Continue patient on ASA 325mg  daily #Start or continue Atorvastatin 40 mg/other high intensity statin # BP goal: per Neuro IR team  # HBAIC and Lipid profile # Telemetry monitoring # Frequent neuro checks # NPO until passes stroke swallow screen  Please page stroke NP  Or  PA  Or MD from 8am -4 pm  as this patient from this time will be  followed by the stroke.   You can look them up on [email or web address]  Password [email or web address]    This patient is neurologically critically ill due to Right MCA stroke.    He is at risk for significant risk of neurological worsening from cerebral edema,  death from brain herniation, heart failure, hemorrhagic conversion, infection, respiratory failure and seizure. This patient's care requires constant monitoring of vital signs, hemodynamics, respiratory and cardiac monitoring, review of multiple databases, neurological assessment, discussion with family, other specialists and medical decision making of high complexity.  I spent 60  minutes of neurocritical time in the care of this patient.Georgiana Spinner Aroor [facility] Pager Number [phone]

## 2018-04-25 NOTE — Anesthesia Preprocedure Evaluation (Addendum)
Anesthesia Evaluation  Patient identified by MRN, date of birth, ID band Patient confused    Reviewed: Allergy & Precautions, NPO status , Patient's Chart, lab work & pertinent test resultsPreop documentation limited or incomplete due to emergent nature of procedure.  Airway Mallampati: II  TM Distance: >3 FB Neck ROM: Full    Dental  (+) Poor Dentition, Dental Advisory Given   Pulmonary neg pulmonary ROS, Current Smoker,    Pulmonary exam normal        Cardiovascular hypertension, negative cardio ROS Normal cardiovascular exam     Neuro/Psych CVA negative psych ROS   GI/Hepatic negative GI ROS, (+)     substance abuse  cocaine use,   Endo/Other  negative endocrine ROS  Renal/GU negative Renal ROS  negative genitourinary   Musculoskeletal negative musculoskeletal ROS (+)   Abdominal   Peds negative pediatric ROS (+)  Hematology negative hematology ROS (+)   Anesthesia Other Findings   Reproductive/Obstetrics negative OB ROS                            Anesthesia Physical Anesthesia Plan  ASA: III and emergent  Anesthesia Plan: General   Post-op Pain Management:    Induction: Intravenous  PONV Risk Score and Plan: 2 and Ondansetron and Dexamethasone  Airway Management Planned: Oral ETT  Additional Equipment: Arterial line  Intra-op Plan:   Post-operative Plan: Possible Post-op intubation/ventilation  Informed Consent: I have reviewed the patients History and Physical, chart, labs and discussed the procedure including the risks, benefits and alternatives for the proposed anesthesia with the patient or authorized representative who has indicated his/her understanding and acceptance.   History available from chart only and Only emergency history available  Plan Discussed with: CRNA, Anesthesiologist and Surgeon  Anesthesia Plan Comments:        Anesthesia Quick  Evaluation

## 2018-04-25 NOTE — Progress Notes (Signed)
Notified MD, BP is not within goal even with multiple prn pushes. New orders to go by BP goal <160.

## 2018-04-25 NOTE — Progress Notes (Signed)
  Echocardiogram 2D Echocardiogram has been performed.  Manuel Harrison 04/25/2018, 2:48 PM

## 2018-04-25 NOTE — Anesthesia Procedure Notes (Signed)
Procedure Name: Intubation Date/Time: 04/25/2018 5:22 AM Performed by: Claudina Lick, CRNA Pre-anesthesia Checklist: Patient identified, Emergency Drugs available, Suction available, Patient being monitored and Timeout performed Patient Re-evaluated:Patient Re-evaluated prior to induction Oxygen Delivery Method: Circle system utilized Preoxygenation: Pre-oxygenation with 100% oxygen Induction Type: IV induction, Rapid sequence and Cricoid Pressure applied Laryngoscope Size: Miller and 2 Grade View: Grade III Tube type: Subglottic suction tube Tube size: 7.5 mm Number of attempts: 2 (One DL by CRNA- no attempt at placing ETT. One DL by MDA with successful placement of ETT) Airway Equipment and Method: Stylet Placement Confirmation: ETT inserted through vocal cords under direct vision,  positive ETCO2 and breath sounds checked- equal and bilateral Secured at: 23 cm Tube secured with: Tape Dental Injury: Teeth and Oropharynx as per pre-operative assessment

## 2018-04-25 NOTE — Progress Notes (Signed)
Saw patient in neuro ICU following procedure. Patient underwent an emergent image-guided cerebral angiogram with mechanical thrombectomy of his right MCA M1 occlusion achieving TICI 3 revascularization this AM with Dr. Corliss Skains.  Patient awake and alert sitting in bed eating lunch with no complaints at this time. Accompanied by girlfriend at bedside. Denies headache, weakness, numbness/tingling, dizziness, vision changes, hearing changes, tinnitus, or speech difficulty.  Alert, awake, and oriented x3. Speech and comprehension intact. PERRL bilaterally. EOMs intact bilaterally without nystagmus or subjective diplopia. Visual fields not assessed. No facial asymmetry. Tongue midline. Motor power symmetric proportional to effort. No pronator drift. Fine motor and coordination intact and symmetric. Gait not assessed. Romberg not assessed. Heel to toe not assessed. Distal pulses 2+ bilaterally. Right groin incision soft with mild tenderness, no active bleeding or hematoma.  Right groin incision stable. Continue taking Brilinta 90 mg twice daily and Aspirin 81 mg once daily. Plan to follow-up with Dr. Corliss Skains in clinic 4 weeks after discharge. Appreciate and agree with neurology management. IR to follow.  Waylan Boga Sergei Delo, PA-C 04/25/2018, 3:27 PM

## 2018-04-25 NOTE — Progress Notes (Signed)
Patient ID: Manuel Harrison, male   DOB: May 22, 1971, 47 y.o.   MRN: [id number] INR. Post procedure CT NO ICH or mass effect. Patient loaded with 180 mg brilinta and aspirin 81 MG via OG tube prior to placement of intracranial stent across RT M1. Patient to continue on aspirin 81 mg per dat and brilinta 90 mg BID . F/U with CTA prior to D/C. Check P2 Y12 in PM today.  Patient left intubated because of vomitting during GA reversal  Moving all 4 s spontaneously..Obeys simple commands appropriately in consistently. . Pupils mm RT = Lt sluggish. Distal pulses all palpable, unchanged. S.Manuel Garfinkle MD

## 2018-04-25 NOTE — Progress Notes (Signed)
STROKE TEAM PROGRESS NOTE   HISTORY OF PRESENT ILLNESS (per record) Manuel Harrison is an 47 y.o. male with past medical history of left MCA stroke, substance abuse (cocaine), smoker, hypertension presents with acute onset left-sided plegia, gaze deviation.  He was brought by EMS as a stroke alert.  Initially last seen normal was stated to be 11:30 PM, however patient's girlfriend said that patient had an episode of left-sided weakness and facial droop at 7:30 PM lasting for about 5 minutes.  Following which she was in the porch and ate dinner.  Went back to sleep at 8:30 PM.  When his mother arrived at 11:30 PM patient was not acting right however, however did not notice any obvious left-sided weakness or facial droop.  Arm 4 AM to go from woke up she noticed patient was not moving the left side and the facial droop and called 9 11.  On arrival, exam is consistent with a right MCA stroke.  A stat CT scan was obtained which showed a hyperdense right MCA and CT angiogram performed confirmed a right M1 cut off.  CT angiogram also shows a old left MCA occlusion as well.  CT perfusion was performed which shows no core approximately 50 cc penumbra. Patient taken to emergent Neuro IR.   Date last known well: 9.30.19 Time last known well: 8.30 pm ( changed from 11.30 pm after speaking with girlfriend) tPA Given: no, outside window Baseline MRS 0   SUBJECTIVE (INTERVAL HISTORY) His RN is at the bedside. Patient has just arrived to ICU from thrombectomy and is sedated on propofol drip. He can be aroused easily and follow simple commands. He is moving extremities on both sides but right side more than left. Blood pressure appears adequately controlled.   OBJECTIVE Vitals:   04/25/18 0445 04/25/18 0910 04/25/18 0915 04/25/18 0930  BP: (!) 162/103 (!) 140/58 (!) 184/115 124/80  Pulse:  86  (!) 104  Resp: (!) 29 17  (!) 22  SpO2: 100% 100% 100% 100%  Height:  5\' 8"  (1.727 m)      CBC:  Recent  Labs  Lab 04/25/18 0422 04/25/18 0433  WBC 6.7  --   NEUTROABS 3.2  --   HGB 15.2 14.6  HCT 43.7 43.0  MCV 91.6  --   PLT 305  --     Basic Metabolic Panel:  Recent Labs  Lab 04/25/18 0422 04/25/18 0433  NA 137 141  K 4.0 3.7  CL 108 105  CO2 22  --   GLUCOSE 136* 134*  BUN 9 8  CREATININE 1.04 0.90  CALCIUM 8.7*  --     Lipid Panel:     Component Value Date/Time   CHOL 237 (H) 09/18/2013 1513   TRIG 651 (H) 09/18/2013 1513   HDL 31 (L) 09/18/2013 1513   CHOLHDL 7.6 09/18/2013 1513   VLDL NOT CALC 09/18/2013 1513   LDLCALC NOT CALC 09/18/2013 1513   HgbA1c:  Lab Results  Component Value Date   HGBA1C 5.5 10/06/2012   Urine Drug Screen:     Component Value Date/Time   LABOPIA NONE DETECTED 10/05/2012 1939   COCAINSCRNUR POSITIVE (A) 10/05/2012 1939   LABBENZ NONE DETECTED 10/05/2012 1939   AMPHETMU NONE DETECTED 10/05/2012 1939   THCU POSITIVE (A) 10/05/2012 1939   LABBARB NONE DETECTED 10/05/2012 1939    Alcohol Level     Component Value Date/Time   ETH <10 04/25/2018 0422    IMAGING  Ct Angio Head  W Or Wo Contrast Ct Angio Neck W Or Wo Contrast 04/25/2018 IMPRESSION:   CTA NECK:  1. No hemodynamically significant stenosis ICA's.  2. Patent vertebral arteries.   CTA HEAD:  1. Emergent RIGHT M1 occlusion with intermediate collaterals.  2. 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    Ct Cerebral Perfusion W Contrast 04/25/2018 IMPRESSION:  RIGHT frontoparietal/MCA territory brain at risk without core infarct though, limited by patient motion and bilateral occluded MCA.    Ct Head Code Stroke Wo Contrast 04/25/2018 IMPRESSION:  1. Mildly dense RIGHT MCA concerning for thromboembolism.  2. Small age-indeterminate RIGHT frontal infarct, new from 2014.  3. Old LEFT basal ganglia infarct.  4. ASPECTS is 9. 5.     S/P RT common carotid arteriogram - Dr Corliss Skains RT CFA approach. Findings. Occluded RT MCA M 1 seg followed  by complete revascularization of RT MCA M1  With x 1 pass with embotrap 5mm x 33 mm retriever device achieving a TICI 3 revascularization . Underlying stenosis worsening despite IA nitroglycerin And superselective IA 5 mg of verapamil to complete re occlusion  reqiuring rescue stent with recovery of TICI 3 revascularization.    Transthoracic Echocardiogram - pending 00/00/00     PHYSICAL EXAM Blood pressure 124/80, pulse (!) 104, resp. rate (!) 22, height 5\' 8"  (1.727 m), SpO2 100 %. Middle-age [place] gentleman who was intubated and sedated and not in distress. . Afebrile. Head is nontraumatic. Neck is supple without bruit.    Cardiac exam no murmur or gallop. Lungs are clear to auscultation. Distal pulses are well felt.  Neurological Exam  Limited due to patient being sedated and intubated.eyes are closed. In response to sternal rub and follow simple midline and body commands on the right. Eyes are in primary position. Follows gaze to the right but not to the left. Pupils equal reactive. Fundi not visualized. No facial weakness. Tongue midline. Moves right upper and lower extremities spontaneously and against gravity. Does move left upper and lower extremity but to a lesser degree. Sensation appears preserved. Deep tendon pulses are symmetric. Plantars are downgoing. Gait not tested.      ASSESSMENT/PLAN Mr. Manuel Harrison is a 47 y.o. male with history of prior stroke, htn, tobacco use and substance abuse presenting with acute onset left-sided plegia, gaze deviation.[name] He did not receive IV t-PA due to late presentation. S/P Thrombectomy / stent Rt M1 occlusion in IR with TICI 3 revascularization.  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   MRI head - pending  MRA head - not performed  CTA H&N - .Emergent Rt M1 occlusion with intermediate  collaterals. Chronic Lt M1 occlusion with intermediate collaterals.   Carotid Doppler - CTA neck performed - carotid dopplers not indicated.  UDS - positive for cocaine and cannabis  HIV antibody - pending  2D Echo - pending  LDL - pending  HgbA1c - pending  VTE prophylaxis - SCDs  Diet - NPO  No antithrombotic prior to admission, now on No antithrombotic  Patient counseled to be compliant with his antithrombotic medications  Ongoing aggressive stroke risk factor management  Therapy recommendations:  pending  Disposition:  Pending  Hypertension  Stable . BP parameters initially per Dr Corliss Skains . Long-term BP goal normotensive  Hyperlipidemia  Lipid lowering medication PTA:  Lipitor 10 mg daily  LDL pending, goal <  70  Current lipid lowering medication: none - NPO  Continue statin at discharge   Other Stroke Risk Factors  Cigarette smoker - advised to stop smoking  ETOH use, advised to drink no more than 1 alcoholic beverage per day.  Obesity, Body mass index is 24.33 kg/m., recommend weight loss, diet and exercise as appropriate   Hx stroke/TIA  Substance abuse history   Other Active Problems        Hospital day # 0 I have personally examined this patient, reviewed notes, independently viewed imaging studies, participated in medical decision making and plan of care.ROS completed by me personally and pertinent positives fully documented  I have made any additions or clarifications directly to the above note.he presented with left hemiplegia due to right middle cerebral artery occlusion and presented beyond time window for IV thrombolysis but underwent successful mechanical thrombectomy but due to recurrent right MCA occlusion required acute angioplasty and stenting. He seems to be doing well clinically and plans to extubate as tolerated. Check MRI scan of the brain later. Continue aspirin and Brilinta due to his intracranial stent and aggressive  risk factor modification. Strict blood pressure control with systolic range 120-140 for 24 hours as per post intervention protocol. No family available at the bedside for discussion. Discussed with Dr. Delton Coombes critical care medicine and Dr. Corliss Skains interventional neuroradiologist. This patient is critically ill and at significant risk of neurological worsening, death and care requires constant monitoring of vital signs, hemodynamics,respiratory and cardiac monitoring, extensive review of multiple databases, frequent neurological assessment, discussion with family, other specialists and medical decision making of high complexity.I have made any additions or clarifications directly to the above note.This critical care time does not reflect procedure time, or teaching time or supervisory time of PA/NP/Med Resident etc but could involve care discussion time.  I spent 35 minutes of neurocritical care time  in the care of  this patient.      Delia Heady, MD Medical Director [facility] Pager: [phone] 04/25/2018 2:33 PM   To contact Stroke Continuity provider, please refer to [email or web address]. After hours, contact General Neurology

## 2018-04-25 NOTE — Procedures (Signed)
S/P RT common carotid arteriogram  RT CFA approach. Findings. 1.Occluded RT MCA M 1 seg followed by complete revascularization of RT MCA M1  With x 1 pass with embotrap 5mm x 33 mm retriever device achieving a TICI 3 revascularization . Underlying stenosis worsening despite IA nitroglycerin And superselective IA 5 mg of verapamil to complete re occlusion  reqiuring rescue stent with recovery of TICI 3 revascularization.

## 2018-04-25 NOTE — Transfer of Care (Signed)
Immediate Anesthesia Transfer of Care Note  Patient: Manuel Harrison  Procedure(s) Performed: RADIOLOGY WITH ANESTHESIA (N/A )  Patient Location: ICU  Anesthesia Type:General  Level of Consciousness: sedated, unresponsive and Patient remains intubated per anesthesia plan  Airway & Oxygen Therapy: Patient remains intubated per anesthesia plan, Patient placed on Ventilator (see vital sign flow sheet for setting) and report given to bedside RN  Post-op Assessment: Report given to RN and Post -op Vital signs reviewed and stable  Post vital signs: Reviewed  Last Vitals:  Vitals Value Taken Time  BP 124/80 04/25/2018  9:30 AM  Temp    Pulse 99 04/25/2018  9:32 AM  Resp 18 04/25/2018  9:32 AM  SpO2 100 % 04/25/2018  9:32 AM  Vitals shown include unvalidated device data.  Last Pain: There were no vitals filed for this visit.       Complications: No apparent anesthesia complications

## 2018-04-25 NOTE — Consult Note (Addendum)
NAME:  Manuel Harrison, MRN:  [id number], DOB:  [birth year], LOS: 0 ADMISSION DATE:  04/25/2018, CONSULTATION DATE:  10/1 REFERRING MD:  Dr. Laurence Slate, CHIEF COMPLAINT:  AMS   Brief History   47 y/o M admitted 10/1 with acute right M1 occlusion.  To NeuroIR for revascularization s/p stenting.  Vomited with general anesthesia reversal.  Patient was left intubated and returned to ICU.    Past Medical History  HTN Tobacco Abuse  Marijuana Abuse   Significant Hospital Events   10/1  Admit with AMS, emergent R M1 occlusion   Consults: date of consult/date signed off & final recs:  PCCM 10/1   Procedures (surgical and bedside):    Significant Diagnostic Tests:  CTA head/neck 10/1 >> demonstrated emergent right M1 occlusion, chronic left M1 occlusion.   CT Cerebral Perfusion 10/1 >> demonstrated right frontoparietal / MCA territory brain risk without core infarct (though limited by patient motion, bilateral occluded MCA).  Micro Data:  MRSA PCR 10/1 >>  BCx2 10/1 >>   Antimicrobials:  Ancef (procedure) 10/1   Subjective:  RN reports agitation with reduced sedation.  Hypertensive despite cleviprex.   Objective   Blood pressure 109/79, pulse 92, resp. rate (!) 24, height 5\' 8"  (1.727 m), SpO2 100 %.    Vent Mode: PRVC FiO2 (%):  [100 %] 100 % Set Rate:  [16 bmp] 16 bmp Vt Set:  [540 mL] 540 mL PEEP:  [5 cmH20] 5 cmH20 Plateau Pressure:  [16 cmH20] 16 cmH20   Intake/Output Summary (Last 24 hours) at 04/25/2018 1000 Last data filed at 04/25/2018 0850 Gross per 24 hour  Intake 1400 ml  Output 805 ml  Net 595 ml   There were no vitals filed for this visit.  Examination: General: adult male lying in bed on vent, NAD HENT: ETT, mm pink/moist, OGT to suction  Lungs: even/non-labored, lungs bilaterally clear  Cardiovascular: s1s2 rrr, no m/r/g  Abdomen: soft/non-distended, bsx4 active  Extremities: warm/dry, no edema  Neuro: sedate  Resolved Hospital Problem list      Assessment & Plan:   Acute Right M1 Occlusion / CVA  - s/p revascularization per Neuro IR with stenting, ASA, Brilinta  P: Recommendations per Neurology / Neuro IR Planned repeat MRI 10/2  Ok for extubation per Neurology once meets criteria  ECHO per Neuro    Acute Respiratory Insufficiency  - in setting of CVA - reported grade III airway, intubated with Hyacinth Meeker 2 x2 attempts  P: PRVC 8 cc/kg  Goal PSV with plan for extubation  Assess CXR to rule out aspiration  ABG now    Hypertension  P: Goal SBP 120-140  Cleviprex gtt for BP goal  PRN hydralazine  Hold further labetalol until UDS returned / hx of cocaine abuse    At Risk Aspiration / Dysphagia  - in setting of CVA, vomiting  - CXR post procedure with no infiltrate P: Aspiration precautions  NPO OGT to suction for possible extubation  PRN zofran    Tobacco Abuse  THC, Cocaine Abuse  P: Substance abuse counseling post extubation   Disposition / Summary of Today's Plan 04/25/18   Remain in ICU.  Plan for extubation if meets criteria.      Diet: NPO  Pain/Anxiety/Delirium protocol (if indicated): propofol  VAP protocol (if indicated): in place  DVT prophylaxis: SCD's  GI prophylaxis: pepcid IV, discontinue if extubated   Hyperglycemia protocol: n/a  Mobility: Bed rest  Code Status: Full code Family Communication: Fiancee updated  at bedside 10/1 on plan of care   Labs   CBC: Recent Labs  Lab 04/25/18 0422 04/25/18 0433  WBC 6.7  --   NEUTROABS 3.2  --   HGB 15.2 14.6  HCT 43.7 43.0  MCV 91.6  --   PLT 305  --     Basic Metabolic Panel: Recent Labs  Lab 04/25/18 0422 04/25/18 0433  NA 137 141  K 4.0 3.7  CL 108 105  CO2 22  --   GLUCOSE 136* 134*  BUN 9 8  CREATININE 1.04 0.90  CALCIUM 8.7*  --    GFR: CrCl cannot be calculated (Unknown ideal weight.). Recent Labs  Lab 04/25/18 0422  WBC 6.7    Liver Function Tests: Recent Labs  Lab 04/25/18 0422  AST 25  ALT 12   ALKPHOS 76  BILITOT 0.8  PROT 6.3*  ALBUMIN 4.0   No results for input(s): LIPASE, AMYLASE in the last 168 hours. No results for input(s): AMMONIA in the last 168 hours.  ABG    Component Value Date/Time   TCO2 25 04/25/2018 0433     Coagulation Profile: Recent Labs  Lab 04/25/18 0422  INR 0.90    Cardiac Enzymes: No results for input(s): CKTOTAL, CKMB, CKMBINDEX, TROPONINI in the last 168 hours.  HbA1C: Hgb A1c MFr Bld  Date/Time Value Ref Range Status  10/06/2012 05:04 AM 5.5 <5.7 % Final    Comment:    (NOTE)                                                                       According to the ADA Clinical Practice Recommendations for 2011, when HbA1c is used as a screening test:  >=6.5%   Diagnostic of Diabetes Mellitus           (if abnormal result is confirmed) 5.7-6.4%   Increased risk of developing Diabetes Mellitus References:Diagnosis and Classification of Diabetes Mellitus,Diabetes Care,2011,34(Suppl 1):S62-S69 and Standards of Medical Care in         Diabetes - 2011,Diabetes Care,2011,34 (Suppl 1):S11-S61.    CBG: No results for input(s): GLUCAP in the last 168 hours.  Admitting History of Present Illness.   47 y/o M who presented [name] on [facility] with left sided weakness, slurred speech, and right sided gaze.  CODE STROKE was activated.  Initial CTA head/neck demonstrated emergent right M1 occlusion, chronic left M1 occlusion.  CT Cerebral Perfusion demonstrated right frontoparietal / MCA territory brain risk without core infarct (though limited by patient motion, bilateral occluded MCA). The patient was emergently taken to [name] for revascularization.  Stent was placed per Dr. Corliss Skains.  Brilinta & ASA initiated.  General anesthesia was reversed and the patient had an episode of vomiting.  Decision was made to leave him intubated and return to ICU for further evaluation.  ETOH negative, UDS pending. Labs otherwise within normal limits.  CXR pending. PCCM  consulted for ICU / ventilator management.   Review of Systems:   Unable to complete as patient is on mechanical ventilation  Past Medical History  He,  has a past medical history of Hypertension and Stroke (HCC).   Surgical History   History reviewed. No pertinent surgical history.   Social History  Social History   Socioeconomic History  . Marital status: Single    Spouse name: Not on file  . Number of children: Not on file  . Years of education: Not on file  . Highest education level: Not on file  Occupational History  . Not on file  Social Needs  . Financial resource strain: Not on file  . Food insecurity:    Worry: Not on file    Inability: Not on file  . Transportation needs:    Medical: Not on file    Non-medical: Not on file  Tobacco Use  . Smoking status: Current Every Day Smoker    Packs/day: 0.50    Years: 27.00    Pack years: 13.50    Types: Cigarettes  . Smokeless tobacco: Never Used  Substance and Sexual Activity  . Alcohol use: Yes    Alcohol/week: 0.0 standard drinks    Comment: 128 oz beer daily  . Drug use: Yes    Types: Marijuana  . Sexual activity: Not on file  Lifestyle  . Physical activity:    Days per week: Not on file    Minutes per session: Not on file  . Stress: Not on file  Relationships  . Social connections:    Talks on phone: Not on file    Gets together: Not on file    Attends religious service: Not on file    Active member of club or organization: Not on file    Attends meetings of clubs or organizations: Not on file    Relationship status: Not on file  . Intimate partner violence:    Fear of current or ex partner: Not on file    Emotionally abused: Not on file    Physically abused: Not on file    Forced sexual activity: Not on file  Other Topics Concern  . Not on file  Social History Narrative  . Not on file  ,  reports that he has been smoking cigarettes. He has a 13.50 pack-year smoking history. He has never used  smokeless tobacco. He reports that he drinks alcohol. He reports that he has current or past drug history. Drug: Marijuana.   Family History   His family history is not on file.   Allergies Allergies  Allergen Reactions  . Aleve [Naproxen Sodium]      Home Medications  Prior to Admission medications   Medication Sig Start Date End Date Taking? Authorizing Provider  atorvastatin (LIPITOR) 10 MG tablet Take 1 tablet (10 mg total) by mouth daily at 6 PM. Patient not taking: Reported on 09/29/2016 11/24/15   Ruben Im, MD  diclofenac sodium (VOLTAREN) 1 % GEL Apply 4 g topically 4 (four) times daily. Patient not taking: Reported on 09/29/2016 11/24/15   Ruben Im, MD  doxycycline (VIBRAMYCIN) 100 MG capsule Take 1 capsule (100 mg total) by mouth 2 (two) times daily. One po bid x 7 days Patient not taking: Reported on 09/29/2016 02/01/16   Street, [place], PA-C  hydrochlorothiazide (MICROZIDE) 12.5 MG capsule Take 1 capsule (12.5 mg total) by mouth daily. Patient not taking: Reported on 09/29/2016 11/24/15 11/23/16  Ruben Im, MD  HYDROcodone-acetaminophen [facility]) 5-325 MG tablet Take 1-2 tablets by mouth every 6 (six) hours as needed for severe pain. Patient not taking: Reported on 09/29/2016 02/01/16   Street, [place], PA-C  ibuprofen (ADVIL,MOTRIN) 200 MG tablet Take 200 mg by mouth every 6 (six) hours as needed for moderate pain.    [provider]  loratadine (CLARITIN) 10 MG tablet Take 1 tablet (10 mg total) by mouth daily. Patient not taking: Reported on 09/29/2016 11/24/15 11/23/16  Ruben Im, MD  naproxen (NAPROSYN) 500 MG tablet Take 1 tablet (500 mg total) by mouth 2 (two) times daily as needed for mild pain, moderate pain or headache (TAKE WITH MEALS.). Patient not taking: Reported on 09/29/2016 02/01/16   Street, [place], PA-C  ondansetron (ZOFRAN) 4 MG tablet Take 1 tablet (4 mg total) by mouth every 6 (six) hours as needed for nausea or vomiting. 09/29/16   Lavera Guise, MD        Canary Brim, NP-C [facility] Pgr: [phone] or if no answer [phone] 04/25/2018, 10:00 AM

## 2018-04-25 NOTE — ED Provider Notes (Signed)
[facility] EMERGENCY DEPARTMENT Provider Note   CSN: [id number] Arrival date & time: 04/25/18  0417     History   Chief Complaint Chief Complaint  Patient presents with  . Code Stroke    HPI Manuel Harrison is a 47 y.o. male.  HPI  4:21 AM Brought in by EMS as a code stroke.  Last seen normal by family at 11:30 PM.  Noted to have left facial droop and neglect as well as left hemiparesis.  Patient cleared for CT scan.  Per report, last seen normal at 11:30 PM.  Noted to have left-sided facial droop, left neglect, left hemiparesis.  History of stroke.  No other history obtainable given acuity of condition.  Patient was further evaluated for neurology in the CT scanner.  CT angios concerning for a right M1 territory occlusion.  Patient was emergently taken to interventional radiology for further to history could be taken.  Level 5 caveat for acuity of condition  Past Medical History:  Diagnosis Date  . Hypertension   . Stroke [facility])     Patient Active Problem List   Diagnosis Date Noted  . Acute right arterial ischemic stroke, middle cerebral artery (MCA) (HCC) 04/25/2018  . Osteoarthritis of left knee 11/24/2015  . Essential hypertension 11/24/2015  . Dry cough 11/24/2015  . Seasonal allergies 11/24/2015  . Dyslipidemia 01/30/2013  . Acute ischemic stroke (HCC) 10/07/2012  . Tobacco abuse disorder 10/06/2012  . History of cocaine use 10/06/2012    No past surgical history on file.      Home Medications    Prior to Admission medications   Medication Sig Start Date End Date Taking? Authorizing Provider  atorvastatin (LIPITOR) 10 MG tablet Take 1 tablet (10 mg total) by mouth daily at 6 PM. Patient not taking: Reported on 09/29/2016 11/24/15   Ruben Im, MD  diclofenac sodium (VOLTAREN) 1 % GEL Apply 4 g topically 4 (four) times daily. Patient not taking: Reported on 09/29/2016 11/24/15   Ruben Im, MD  doxycycline (VIBRAMYCIN) 100 MG capsule  Take 1 capsule (100 mg total) by mouth 2 (two) times daily. One po bid x 7 days Patient not taking: Reported on 09/29/2016 02/01/16   Street, [place], PA-C  hydrochlorothiazide (MICROZIDE) 12.5 MG capsule Take 1 capsule (12.5 mg total) by mouth daily. Patient not taking: Reported on 09/29/2016 11/24/15 11/23/16  Ruben Im, MD  HYDROcodone-acetaminophen [facility]) 5-325 MG tablet Take 1-2 tablets by mouth every 6 (six) hours as needed for severe pain. Patient not taking: Reported on 09/29/2016 02/01/16   Street, [place], PA-C  ibuprofen (ADVIL,MOTRIN) 200 MG tablet Take 200 mg by mouth every 6 (six) hours as needed for moderate pain.    [provider]  loratadine (CLARITIN) 10 MG tablet Take 1 tablet (10 mg total) by mouth daily. Patient not taking: Reported on 09/29/2016 11/24/15 11/23/16  Ruben Im, MD  naproxen (NAPROSYN) 500 MG tablet Take 1 tablet (500 mg total) by mouth 2 (two) times daily as needed for mild pain, moderate pain or headache (TAKE WITH MEALS.). Patient not taking: Reported on 09/29/2016 02/01/16   Street, [place], PA-C  ondansetron (ZOFRAN) 4 MG tablet Take 1 tablet (4 mg total) by mouth every 6 (six) hours as needed for nausea or vomiting. 09/29/16   Lavera Guise, MD    Family History No family history on file.  Social History Social History   Tobacco Use  . Smoking status: Current Every Day Smoker    Packs/day:  0.50    Years: 27.00    Pack years: 13.50    Types: Cigarettes  . Smokeless tobacco: Never Used  Substance Use Topics  . Alcohol use: Yes    Alcohol/week: 0.0 standard drinks    Comment: 128 oz beer daily  . Drug use: Yes    Types: Marijuana     Allergies   Aleve [naproxen sodium]   Review of Systems Review of Systems  Unable to perform ROS: Acuity of condition     Physical Exam Updated Vital Signs There were no vitals taken for this visit.  Physical Exam  Constitutional: He is oriented to person, place, and time.  Ill-appearing but nontoxic,  protecting airway  HENT:  Head: Normocephalic and atraumatic.  Left facial droop noted  Eyes: Pupils are equal, round, and reactive to light.  Cardiovascular: Normal rate and regular rhythm.  Pulmonary/Chest: Effort normal. No respiratory distress.  Musculoskeletal: He exhibits no edema.  Neurological: He is alert and oriented to person, place, and time.  Left facial droop, left-sided neglect, left hemiparesis left upper and lower extremity  Skin: Skin is warm and dry.  Psychiatric: He has a normal mood and affect.  Nursing note and vitals reviewed.    ED Treatments / Results  Labs (all labs ordered are listed, but only abnormal results are displayed) Labs Reviewed  I-STAT CHEM 8, ED - Abnormal; Notable for the following components:      Result Value   Glucose, Bld 134 (*)    Calcium, Ion 1.13 (*)    All other components within normal limits  CULTURE, BLOOD (ROUTINE X 2)  CULTURE, BLOOD (ROUTINE X 2)  PROTIME-INR  APTT  CBC  DIFFERENTIAL  ETHANOL  COMPREHENSIVE METABOLIC PANEL  RAPID URINE DRUG SCREEN, HOSP PERFORMED  URINALYSIS, ROUTINE W REFLEX MICROSCOPIC  HIV ANTIBODY (ROUTINE TESTING W REFLEX)  HEMOGLOBIN A1C  LIPID PANEL  I-STAT TROPONIN, ED    EKG None  Radiology Ct Angio Head W Or Wo Contrast  Result Date: 04/25/2018 CLINICAL DATA:  LEFT-sided weakness, slurred speech. Last seen normal at 2330 hours. Follow up code stroke. EXAM: CT ANGIOGRAPHY HEAD AND NECK TECHNIQUE: Multidetector CT imaging of the head and neck was performed using the standard protocol during bolus administration of intravenous contrast. Multiplanar CT image reconstructions and MIPs were obtained to evaluate the vascular anatomy. Carotid stenosis measurements (when applicable) are obtained utilizing NASCET criteria, using the distal internal carotid diameter as the denominator. CONTRAST:  50mL ISOVUE-370 IOPAMIDOL (ISOVUE-370) INJECTION 76% COMPARISON:  MRA head October 06, 2012 and CT HEAD  April 25, 2018. FINDINGS: CTA NECK FINDINGS: AORTIC ARCH: Normal appearance of the thoracic arch, 2 vessel arch is a normal variant. The origins of the innominate, left Common carotid artery and subclavian artery are widely patent. RIGHT CAROTID SYSTEM: Common carotid artery is patent, extrinsic compression by laryngeal cartilage. Normal appearance of the carotid bifurcation without hemodynamically significant stenosis by NASCET criteria. Mild luminal irregularity proximal internal carotid artery most compatible with atherosclerosis. Tonsillar loop. LEFT CAROTID SYSTEM: Common carotid artery is patent. Normal appearance of the carotid bifurcation without hemodynamically significant stenosis by NASCET criteria. Normal appearance of the internal carotid artery. VERTEBRAL ARTERIES:RIGHT vertebral artery is dominant. Normal appearance of the vertebral arteries, widely patent. SKELETON: No acute osseous process though bone windows have not been submitted. Poor dentition. Moderate cervical spondylosis. OTHER NECK: Soft tissues of the neck are nonacute though, not tailored for evaluation. UPPER CHEST: Included lung apices are clear. No superior mediastinal  lymphadenopathy. CTA HEAD FINDINGS: ANTERIOR CIRCULATION: Patent cervical internal carotid arteries, petrous, cavernous and supra clinoid internal carotid arteries. Patent anterior communicating artery. Occluded distal RIGHT M1 segment with intermediate collateralization. Chronically occluded LEFT MCA with Intermedia collaterals. No large vessel occlusion, significant stenosis, contrast extravasation or aneurysm. POSTERIOR CIRCULATION: Patent vertebral arteries, vertebrobasilar junction and basilar artery, as well as main branch vessels. Patent posterior cerebral arteries. Robust bilateral posterior communicating arteries present. No large vessel occlusion, significant stenosis, contrast extravasation or aneurysm. VENOUS SINUSES: Major dural venous sinuses are patent  though not tailored for evaluation on this angiographic examination. ANATOMIC VARIANTS: None. DELAYED PHASE: Not performed. MIP images reviewed. IMPRESSION: CTA NECK: 1. No hemodynamically significant stenosis ICA's. 2. Patent vertebral arteries. CTA HEAD: 1. Emergent RIGHT M1 occlusion with intermediate collaterals. 2. 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 Critical Value/emergent results text paged to Dr.SUSHANTH AROOR via AMION secure system on 04/25/2018 at 4:40 am, including interpreting physician's phone number. Electronically Signed   By: Awilda Metro M.D.   On: 04/25/2018 04:48   Ct Angio Neck W Or Wo Contrast  Result Date: 04/25/2018 CLINICAL DATA:  LEFT-sided weakness, slurred speech. Last seen normal at 2330 hours. Follow up code stroke. EXAM: CT ANGIOGRAPHY HEAD AND NECK TECHNIQUE: Multidetector CT imaging of the head and neck was performed using the standard protocol during bolus administration of intravenous contrast. Multiplanar CT image reconstructions and MIPs were obtained to evaluate the vascular anatomy. Carotid stenosis measurements (when applicable) are obtained utilizing NASCET criteria, using the distal internal carotid diameter as the denominator. CONTRAST:  50mL ISOVUE-370 IOPAMIDOL (ISOVUE-370) INJECTION 76% COMPARISON:  MRA head October 06, 2012 and CT HEAD April 25, 2018. FINDINGS: CTA NECK FINDINGS: AORTIC ARCH: Normal appearance of the thoracic arch, 2 vessel arch is a normal variant. The origins of the innominate, left Common carotid artery and subclavian artery are widely patent. RIGHT CAROTID SYSTEM: Common carotid artery is patent, extrinsic compression by laryngeal cartilage. Normal appearance of the carotid bifurcation without hemodynamically significant stenosis by NASCET criteria. Mild luminal irregularity proximal internal carotid artery most compatible with atherosclerosis. Tonsillar loop. LEFT CAROTID SYSTEM: Common carotid artery is patent. Normal  appearance of the carotid bifurcation without hemodynamically significant stenosis by NASCET criteria. Normal appearance of the internal carotid artery. VERTEBRAL ARTERIES:RIGHT vertebral artery is dominant. Normal appearance of the vertebral arteries, widely patent. SKELETON: No acute osseous process though bone windows have not been submitted. Poor dentition. Moderate cervical spondylosis. OTHER NECK: Soft tissues of the neck are nonacute though, not tailored for evaluation. UPPER CHEST: Included lung apices are clear. No superior mediastinal lymphadenopathy. CTA HEAD FINDINGS: ANTERIOR CIRCULATION: Patent cervical internal carotid arteries, petrous, cavernous and supra clinoid internal carotid arteries. Patent anterior communicating artery. Occluded distal RIGHT M1 segment with intermediate collateralization. Chronically occluded LEFT MCA with Intermedia collaterals. No large vessel occlusion, significant stenosis, contrast extravasation or aneurysm. POSTERIOR CIRCULATION: Patent vertebral arteries, vertebrobasilar junction and basilar artery, as well as main branch vessels. Patent posterior cerebral arteries. Robust bilateral posterior communicating arteries present. No large vessel occlusion, significant stenosis, contrast extravasation or aneurysm. VENOUS SINUSES: Major dural venous sinuses are patent though not tailored for evaluation on this angiographic examination. ANATOMIC VARIANTS: None. DELAYED PHASE: Not performed. MIP images reviewed. IMPRESSION: CTA NECK: 1. No hemodynamically significant stenosis ICA's. 2. Patent vertebral arteries. CTA HEAD: 1. Emergent RIGHT M1 occlusion with intermediate collaterals. 2. 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 Critical Value/emergent results text paged to Dr.SUSHANTH AROOR via AMION secure system  on 04/25/2018 at 4:40 am, including interpreting physician's phone number. Electronically Signed   By: Awilda Metro M.D.   On: 04/25/2018 04:48    Ct Head Code Stroke Wo Contrast  Result Date: 04/25/2018 CLINICAL DATA:  Code stroke. LEFT-sided weakness, slurred speech. History of stroke and hypertension. EXAM: CT HEAD WITHOUT CONTRAST TECHNIQUE: Contiguous axial images were obtained from the base of the skull through the vertex without intravenous contrast. COMPARISON:  MRI head October 06, 2012 FINDINGS: BRAIN: No intraparenchymal hemorrhage, mass effect nor midline shift. Old LEFT basal ganglia infarct with ex vacuo dilatation LEFT lateral ventricle. Focal blurring RIGHT frontal gray-white matter junction. No abnormal extra-axial fluid collections. Basal cisterns are patent. VASCULAR: Mildly dense RIGHT MCA versus artifact. SKULL/SOFT TISSUES: No skull fracture. No significant soft tissue swelling. ORBITS/SINUSES: The included ocular globes and orbital contents are normal.Trace paranasal sinus mucosal thickening. Mastoid air cells are well aerated. OTHER: None. ASPECTS [facility] Stroke Program Early CT Score) - Ganglionic level infarction (caudate, lentiform nuclei, internal capsule, insula, M1-M3 cortex): 6 - Supraganglionic infarction (M4-M6 cortex): 3 Total score (0-10 with 10 being normal): 9 IMPRESSION: 1. Mildly dense RIGHT MCA concerning for thromboembolism. 2. Small age-indeterminate RIGHT frontal infarct, new from 2014. 3. Old LEFT basal ganglia infarct. 4. ASPECTS is 9. 5. Critical Value/emergent results text paged to Dr.AROOR, Neurology via AMION secure system on 04/25/2018 at 4:36 am, including interpreting physician's phone number. Electronically Signed   By: Awilda Metro M.D.   On: 04/25/2018 04:37    Procedures Procedures (including critical care time)  Medications Ordered in ED Medications   stroke: mapping our early stages of recovery book (has no administration in time range)  0.9 %  sodium chloride infusion (has no administration in time range)  acetaminophen (TYLENOL) tablet 650 mg (has no administration in time range)     Or  acetaminophen (TYLENOL) solution 650 mg (has no administration in time range)    Or  acetaminophen (TYLENOL) suppository 650 mg (has no administration in time range)  iopamidol (ISOVUE-370) 76 % injection 50 mL (50 mLs Intravenous Contrast Given 04/25/18 0439)  iopamidol (ISOVUE-370) 76 % injection 50 mL (40 mLs Intravenous Contrast Given 04/25/18 0502)     Initial Impression / Assessment and Plan / ED Course  I have reviewed the triage vital signs and the nursing notes.  Pertinent labs & imaging results that were available during my care of the patient were reviewed by me and considered in my medical decision making (see chart for details).     Patient with limited time in the ED.  He was evaluated prior to CT scan and sent emergently to CT scan as he is in a code stroke window.  He was found to have an acute occlusion of M1 territory and was sent emergently to IR.  Final Clinical Impressions(s) / ED Diagnoses   Final diagnoses:  Acute ischemic right MCA stroke [facility])    ED Discharge Orders    None       Wilkie Aye, Mayer Masker, MD 04/25/18 [phone]

## 2018-04-26 ENCOUNTER — Other Ambulatory Visit: Payer: Self-pay

## 2018-04-26 ENCOUNTER — Inpatient Hospital Stay (HOSPITAL_COMMUNITY): Payer: Self-pay

## 2018-04-26 ENCOUNTER — Encounter (HOSPITAL_COMMUNITY): Payer: Self-pay | Admitting: Interventional Radiology

## 2018-04-26 DIAGNOSIS — I6601 Occlusion and stenosis of right middle cerebral artery: Secondary | ICD-10-CM

## 2018-04-26 LAB — CBC WITH DIFFERENTIAL/PLATELET
Abs Immature Granulocytes: 0 10*3/uL (ref 0.0–0.1)
Basophils Absolute: 0 10*3/uL (ref 0.0–0.1)
Basophils Relative: 1 %
Eosinophils Absolute: 0.1 10*3/uL (ref 0.0–0.7)
Eosinophils Relative: 2 %
HCT: 34.6 % — ABNORMAL LOW (ref 39.0–52.0)
Hemoglobin: 12.5 g/dL — ABNORMAL LOW (ref 13.0–17.0)
Immature Granulocytes: 1 %
Lymphocytes Relative: 29 %
Lymphs Abs: 1.7 10*3/uL (ref 0.7–4.0)
MCH: 33 pg (ref 26.0–34.0)
MCHC: 36.1 g/dL — ABNORMAL HIGH (ref 30.0–36.0)
MCV: 91.3 fL (ref 78.0–100.0)
Monocytes Absolute: 0.4 10*3/uL (ref 0.1–1.0)
Monocytes Relative: 6 %
Neutro Abs: 3.6 10*3/uL (ref 1.7–7.7)
Neutrophils Relative %: 61 %
Platelets: 317 10*3/uL (ref 150–400)
RBC: 3.79 MIL/uL — ABNORMAL LOW (ref 4.22–5.81)
RDW: 12.8 % (ref 11.5–15.5)
WBC: 5.8 10*3/uL (ref 4.0–10.5)

## 2018-04-26 LAB — HEMOGLOBIN A1C
Hgb A1c MFr Bld: 5.6 % (ref 4.8–5.6)
Mean Plasma Glucose: 114 mg/dL

## 2018-04-26 LAB — BASIC METABOLIC PANEL
Anion gap: 3 — ABNORMAL LOW (ref 5–15)
BUN: <5 mg/dL — ABNORMAL LOW (ref 6–20)
CO2: 21 mmol/L — ABNORMAL LOW (ref 22–32)
Calcium: 7.4 mg/dL — ABNORMAL LOW (ref 8.9–10.3)
Chloride: 111 mmol/L (ref 98–111)
Creatinine, Ser: 0.78 mg/dL (ref 0.61–1.24)
GFR calc Af Amer: >60 mL/min (ref >60)
GFR calc non Af Amer: >60 mL/min (ref >60)
Glucose, Bld: 96 mg/dL (ref 70–99)
Potassium: 5.7 mmol/L — ABNORMAL HIGH (ref 3.5–5.1)
Sodium: 135 mmol/L (ref 135–145)

## 2018-04-26 LAB — HIV ANTIBODY (ROUTINE TESTING W REFLEX): HIV Screen 4th Generation wRfx: NONREACTIVE

## 2018-04-26 MED ORDER — VARENICLINE TARTRATE 0.5 MG PO TABS
0.5000 mg | ORAL_TABLET | Freq: Every day | ORAL | Status: AC
  Administered 2018-04-26 – 2018-04-28 (×3): 0.5 mg via ORAL
  Filled 2018-04-26 (×3): qty 1

## 2018-04-26 MED ORDER — ROSUVASTATIN CALCIUM 20 MG PO TABS
20.0000 mg | ORAL_TABLET | Freq: Every day | ORAL | Status: DC
  Administered 2018-04-26 – 2018-04-27 (×2): 20 mg via ORAL
  Filled 2018-04-26 (×2): qty 1

## 2018-04-26 MED ORDER — VARENICLINE TARTRATE 0.5 MG PO TABS: 0.5000 mg | ORAL_TABLET | Freq: Two times a day (BID) | ORAL | Status: DC

## 2018-04-26 MED ORDER — PNEUMOCOCCAL VAC POLYVALENT 25 MCG/0.5ML IJ INJ
0.5000 mL | INJECTION | INTRAMUSCULAR | Status: AC
  Administered 2018-04-27: 0.5 mL via INTRAMUSCULAR
  Filled 2018-04-26: qty 0.5

## 2018-04-26 MED ORDER — VARENICLINE TARTRATE 1 MG PO TABS: 1.0000 mg | ORAL_TABLET | Freq: Two times a day (BID) | ORAL | Status: DC

## 2018-04-26 MED ORDER — INFLUENZA VAC SPLIT QUAD 0.5 ML IM SUSY
0.5000 mL | PREFILLED_SYRINGE | INTRAMUSCULAR | Status: AC
  Administered 2018-04-27: 0.5 mL via INTRAMUSCULAR
  Filled 2018-04-26: qty 0.5

## 2018-04-26 NOTE — Progress Notes (Signed)
STROKE TEAM PROGRESS NOTE       SUBJECTIVE (INTERVAL HISTORY) His RN is at the bedside. Patient was extubated yesterday and has done well. He has passed swallow eval today. MRI scan of the brain yesterday showed patchy right subcortical small cortical nonhemorrhagic infarct.transfer the echo was unremarkable. Telemetry monitoring does not show any arrhythmias   OBJECTIVE Vitals:   04/26/18 0400 04/26/18 0500 04/26/18 0600 04/26/18 0700  BP: 128/74 127/72 120/69 122/84  Pulse:   87 83  Resp:   19 19  Temp:      TempSrc:      SpO2:   100% (!) 60%  Height:        CBC:  Recent Labs  Lab 04/25/18 0422 04/25/18 0433 04/26/18 0543  WBC 6.7  --  5.8  NEUTROABS 3.2  --  3.6  HGB 15.2 14.6 12.5*  HCT 43.7 43.0 34.6*  MCV 91.6  --  91.3  PLT 305  --  317    Basic Metabolic Panel:  Recent Labs  Lab 04/25/18 0422 04/25/18 0433 04/26/18 0543  NA 137 141 135  K 4.0 3.7 5.7*  CL 108 105 111  CO2 22  --  21*  GLUCOSE 136* 134* 96  BUN 9 8 <5*  CREATININE 1.04 0.90 0.78  CALCIUM 8.7*  --  7.4*    Lipid Panel:     Component Value Date/Time   CHOL 139 04/25/2018 1020   TRIG 790 (H) 04/25/2018 1020   HDL 33 (L) 04/25/2018 1020   CHOLHDL 4.2 04/25/2018 1020   VLDL UNABLE TO CALCULATE IF TRIGLYCERIDE OVER 400 mg/dL 40/98/1191 4782   LDLCALC UNABLE TO CALCULATE IF TRIGLYCERIDE OVER 400 mg/dL 95/62/1308 6578   IONG2X:  Lab Results  Component Value Date   HGBA1C 5.6 04/25/2018   Urine Drug Screen:     Component Value Date/Time   LABOPIA NONE DETECTED 04/25/2018 1005   COCAINSCRNUR NONE DETECTED 04/25/2018 1005   LABBENZ NONE DETECTED 04/25/2018 1005   AMPHETMU NONE DETECTED 04/25/2018 1005   THCU NONE DETECTED 04/25/2018 1005   LABBARB NONE DETECTED 04/25/2018 1005    Alcohol Level     Component Value Date/Time   ETH <10 04/25/2018 0422    IMAGING  MR Brain WO Contrast 04/26/2018 IMPRESSION: 1. Small areas of acute/early subacute infarction involving the  right putamen, right mid caudate body, right frontal operculum, and the right middle frontal gyrus white matter. Additional punctate focus of acute/early subacute infarction within the left posterior temporal periventricular white matter. No associated hemorrhage or mass effect. 2. Chronic lacunar infarct of the left basal ganglia. 3. Mild chronic microvascular ischemic changes and volume loss of the brain. 4. Mild paranasal sinus mucosal thickening.   Ct Angio Head W Or Wo Contrast Ct Angio Neck W Or Wo Contrast 04/25/2018 IMPRESSION:   CTA NECK:  1. No hemodynamically significant stenosis ICA's.  2. Patent vertebral arteries.   CTA HEAD:  1. Emergent RIGHT M1 occlusion with intermediate collaterals.  2. 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    Ct Cerebral Perfusion W Contrast 04/25/2018 IMPRESSION:  RIGHT frontoparietal/MCA territory brain at risk without core infarct though, limited by patient motion and bilateral occluded MCA.    Ct Head Code Stroke Wo Contrast 04/25/2018 IMPRESSION:  1. Mildly dense RIGHT MCA concerning for thromboembolism.  2. Small age-indeterminate RIGHT frontal infarct, new from 2014.  3. Old LEFT basal ganglia infarct.  4. ASPECTS is 9. 5.    DG Chest Portable  04/26/2018 IMPRESSION: 1. Removal of endotracheal tube and NG tube. 2. Improved aeration.  No active lung disease.    S/P RT common carotid arteriogram - Dr Corliss Skains RT CFA approach. Findings. Occluded RT MCA M 1 seg followed by complete revascularization of RT MCA M1  With x 1 pass with embotrap 5mm x 33 mm retriever device achieving a TICI 3 revascularization . Underlying stenosis worsening despite IA nitroglycerin And superselective IA 5 mg of verapamil to complete re occlusion  reqiuring rescue stent with recovery of TICI 3 revascularization.    Transthoracic Echocardiogram 04/25/2018 Study Conclusions - Left ventricle: The cavity size was normal. Wall thickness  was   increased in a pattern of moderate LVH. Systolic function was   normal. The estimated ejection fraction was in the range of 60%   to 65%. Wall motion was normal; there were no regional wall   motion abnormalities. Features are consistent with a pseudonormal   left ventricular filling pattern, with concomitant abnormal   relaxation and increased filling pressure (grade 2 diastolic   dysfunction).     PHYSICAL EXAM Blood pressure 122/84, pulse 83, temperature 98.9 F (37.2 C), temperature source Oral, resp. rate 19, height 5\' 8"  (1.727 m), SpO2 (!) 60 %. Middle-age [place] gentleman who is not in distress. . Afebrile. Head is nontraumatic. Neck is supple without bruit.    Cardiac exam no murmur or gallop. Lungs are clear to auscultation. Distal pulses are well felt.  Neurological Exam  Awake alert oriented 3 with normal speech and language function. Slight right gaze preference but able to look all the way to the left.  upils equal reactive. Fundi not visualized. Mild left lower facial weakness. Tongue midline. Mild left hemiplegia with left upper extremity 4 +/5 strength.diminished fine finger movements on the left. Orbits right over left upper extremity. Sensation appears preserved. Deep tendon reflexes are symmetric. Plantars are downgoing. Gait not tested.      ASSESSMENT/PLAN Manuel Harrison is a 47 y.o. male with history of prior stroke, htn, tobacco use and substance abuse presenting with acute onset left-sided plegia, gaze deviation.[name] He did not receive IV t-PA due to late presentation. S/P Thrombectomy / stent Rt M1 occlusion in IR with TICI 3 revascularization.  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   MRI head - Small areas of acute/early subacute infarction involving the right putamen, right mid caudate body, right frontal  operculum, and the right middle frontal gyrus white matter. Additional punctate focus of acute/early subacute infarction within the left posterior temporal periventricular white matter  MRA head - not performed  CTA H&N - .Emergent Rt M1 occlusion with intermediate collaterals. Chronic Lt M1 occlusion with intermediate collaterals.   Carotid Doppler - CTA neck performed - carotid dopplers not indicated.  UDS - positive for cocaine and cannabis  HIV antibody - non reactive  2D Echo  - EF 60 - 65%. No cardiac source of emboli identified.   LDL - unable to calculate due to elevated triglycerides  HgbA1c - 5.6  VTE prophylaxis - SCDs  Diet - NPO  No antithrombotic prior to admission, now on No antithrombotic  Patient counseled to be compliant with his antithrombotic medications  Ongoing aggressive stroke risk factor management  Therapy recommendations:  Possible CIR admission  Disposition:  Pending  Hypertension  Stable . BP parameters initially  per Dr Corliss Skains . Long-term BP goal normotensive  Hyperlipidemia  Lipid lowering medication PTA:  Lipitor 10 mg daily  LDL pending, goal < 70  Current lipid lowering medication: none - NPO  Continue statin at discharge   Other Stroke Risk Factors  Cigarette smoker - advised to stop smoking  ETOH use, advised to drink no more than 1 alcoholic beverage per day.  Obesity, Body mass index is 24.33 kg/m., recommend weight loss, diet and exercise as appropriate   Hx stroke/TIA  Substance abuse history   Other Active Problems  Potassium 5.7 -> recheck in AM  TEE and Loop requested for tomorrow.      Hospital day # 1 I have personally examined this patient, reviewed notes, independently viewed imaging studies, participated in medical decision making and plan of care.ROS completed by me personally and pertinent positives fully documented  I have made any additions or clarifications directly to the above note.he  presented with left hemiplegia due to right middle cerebral artery occlusion and presented beyond time window for IV thrombolysis but underwent successful mechanical thrombectomy but due to recurrent right MCA occlusion required acute angioplasty and stenting. He seems to be doing well clinically and plan to transfer to the floor bed today. Mobilize out of bed. Therapy and rehabilitation consult.. Continue aspirin and Brilinta due to his intracranial stent and aggressive risk factor modification. Long discussion with family available at the bedside . Discussed with Dr. Delton Coombes critical care medicine and Dr. Corliss Skains interventional neuroradiologist.greater than 50% time during this 35 minute visit was spent on counseling and coordination of care about his stroke and discussion with care team and answering questions Delia Heady, MD Medical Director [facility] Pager: [phone] 04/26/2018 2:23 PM       To contact Stroke Continuity provider, please refer to [email or web address]. After hours, contact General Neurology

## 2018-04-26 NOTE — Consult Note (Signed)
Physical Medicine and Rehabilitation Consult   Reason for Consult: Stroke Referring Physician: Dr. Pearlean Brownie   HPI: Manuel Harrison is a 47 y.o. 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  Patient outside window for TPA and underwent cerebral angiogram with complete revascularization right MCA M1 with reocclusion requiring rescue stent with recovery of T1C1 3 revascularization..  Postprocedure, MRI of brain done revealing small areas of acute/early subacute infarct involving right putamen, right mid carotid body right frontal operculum and right middle frontal gyrus white matter as well as punctate focus left posterior temporal lobe.  2D echo showed EF of 60 to 65% with moderate LVH and no wall abnormality.  To continue baby ASA and Brilinta twice daily with follow-up CTA recommended prior to dischargeHe tolerated extubation and has been weaned off clevidipine.  Patient has had improvement in left sided weakness and verbal output. Therapy evaluations pending. CIR recommended by MD.   Manuel Harrison reports that he has been up and able to take a few steps independently. He was fully independent and works as a Investment banker, operational.    Review of Systems  Constitutional: Negative for chills and fever.  HENT: Negative for hearing loss and tinnitus.   Eyes: Positive for blurred vision and double vision (wears special glasses).  Respiratory: Negative for cough and shortness of breath.   Cardiovascular: Negative for chest pain and palpitations.  Gastrointestinal: Negative for constipation, heartburn and nausea.  Genitourinary: Positive for dysuria. Negative for frequency and urgency.  Musculoskeletal: Negative for joint  pain and neck pain.       Right groin pain.   Neurological: Positive for dizziness and weakness. Negative for headaches.  Psychiatric/Behavioral: Positive for memory loss (has problems with short term memory). The patient is not nervous/anxious.      Past Medical History:  Diagnosis Date  . Hypertension   . Stroke [facility])    Past Surgical History:  Procedure Laterality Date  . RADIOLOGY WITH ANESTHESIA N/A 04/25/2018   Procedure: RADIOLOGY WITH ANESTHESIA;  Surgeon: Julieanne Cotton, MD;  Location: [facility] OR;  Service: Radiology;  Laterality: N/A;    History reviewed. No pertinent family history.    Social History:  LIves with family. Per reports that he has been smoking cigarettes--2 PPD.  He has never used smokeless tobacco. He reports that he drinks alcohol--2 -48 ounce cans of beer. [name] He reports that he has current or past drug history. Drug: Marijuana.    Allergies  Allergen Reactions  . Aleve [Naproxen Sodium] Nausea And Vomiting    Medications Prior to Admission  Medication Sig Dispense Refill  . atorvastatin (LIPITOR) 10 MG tablet Take 1 tablet (10 mg total) by mouth daily at 6 PM. (Patient not taking: Reported on 09/29/2016) 30 tablet 3  . diclofenac sodium (VOLTAREN) 1 % GEL Apply 4 g topically 4 (four) times daily. (Patient not taking: Reported on 09/29/2016) 1 Tube 3  . doxycycline (VIBRAMYCIN) 100 MG capsule Take 1 capsule (100 mg total) by mouth 2 (two) times daily. One po bid x 7 days (Patient not taking: Reported on 09/29/2016) 14 capsule 0  . hydrochlorothiazide (MICROZIDE) 12.5 MG capsule Take 1 capsule (12.5 mg total) by mouth daily. (Patient not taking: Reported on 09/29/2016) 30 capsule 11  .  HYDROcodone-acetaminophen (NORCO) 5-325 MG tablet Take 1-2 tablets by mouth every 6 (six) hours as needed for severe pain. (Patient not taking: Reported on 09/29/2016) 15 tablet 0  . loratadine (CLARITIN) 10 MG tablet Take 1 tablet (10 mg total) by mouth daily. (Patient not taking:  Reported on 09/29/2016) 30 tablet 2  . naproxen (NAPROSYN) 500 MG tablet Take 1 tablet (500 mg total) by mouth 2 (two) times daily as needed for mild pain, moderate pain or headache (TAKE WITH MEALS.). (Patient not taking: Reported on 09/29/2016) 20 tablet 0  . ondansetron (ZOFRAN) 4 MG tablet Take 1 tablet (4 mg total) by mouth every 6 (six) hours as needed for nausea or vomiting. (Patient not taking: Reported on 04/25/2018) 12 tablet 0    Home: Home Living Available Help at Discharge: Family Type of Home: House  Lives With: Spouse  Functional History:   Functional Status:  Mobility:          ADL:    Cognition: Cognition Overall Cognitive Status: Impaired/Different from baseline Arousal/Alertness: Awake/alert Orientation Level: Oriented X4 Attention: Focused, Sustained, Selective Focused Attention: Appears intact Sustained Attention: Appears intact(slightly distractible but self-monitors independently) Selective Attention: Appears intact Memory: Impaired Memory Impairment: Storage deficit, Decreased short term memory Decreased Short Term Memory: Verbal basic Awareness: Impaired Awareness Impairment: Intellectual impairment Problem Solving: Appears intact Executive Function: Self Monitoring, Self Correcting, Initiating Initiating: Appears intact Self Monitoring: Appears intact Self Correcting: Appears intact Safety/Judgment: Appears intact Cognition Overall Cognitive Status: Impaired/Different from baseline  Blood pressure (!) 142/70, pulse 83, temperature 98.3 F (36.8 C), temperature source Axillary, resp. rate (!) 31, height 5\' 8"  (1.727 m), SpO2 100 %. Physical Exam  Nursing note and vitals reviewed. Constitutional: He is oriented to person, place, and time. He appears well-developed and well-nourished.  HENT:  Head: Normocephalic and atraumatic.  Eyes: Pupils are equal, round, and reactive to light. Conjunctivae and EOM are normal.  Neck: Normal range of motion.    Cardiovascular: Normal rate, regular rhythm and normal heart sounds.  Respiratory: Effort normal and breath sounds normal.  GI: Soft. Bowel sounds are normal.  Neurological: He is alert and oriented to person, place, and time. He exhibits normal muscle tone. Gait abnormal.  Motor 4/5 , B Delt, Bi, Tri , grip, HF , KE ADF  Sensation normal LT in BLE  Psychiatric: He has a normal mood and affect.    Results for orders placed or performed during the hospital encounter of 04/25/18 (from the past 24 hour(s))  I-STAT 3, arterial blood gas (G3+)     Status: Abnormal   Collection Time: 04/25/18 10:35 AM  Result Value Ref Range   pH, Arterial 7.351 7.350 - 7.450   pCO2 arterial 43.5 32.0 - 48.0 mmHg   pO2, Arterial 484.0 (H) 83.0 - 108.0 mmHg   Bicarbonate 24.2 20.0 - 28.0 mmol/L   TCO2 26 22 - 32 mmol/L   O2 Saturation 100.0 %   Acid-base deficit 2.0 0.0 - 2.0 mmol/L   Patient temperature 97.5 F    Collection site ARTERIAL LINE    Drawn by RT    Sample type ARTERIAL   Culture, blood (routine x 2)     Status: None (Preliminary result)   Collection Time: 04/25/18 11:42 AM  Result Value Ref Range   Specimen Description BLOOD LEFT ANTECUBITAL    Special Requests      BOTTLES DRAWN AEROBIC ONLY Blood Culture adequate volume   Culture      NO GROWTH <  24 HOURS Performed at [facility], [address], [place], [place] [address]    Report Status PENDING   Glucose, capillary     Status: None   Collection Time: 04/25/18 11:48 AM  Result Value Ref Range   Glucose-Capillary 97 70 - 99 mg/dL   Comment 1 Notify RN    Comment 2 Document in Chart   Culture, blood (routine x 2)     Status: None (Preliminary result)   Collection Time: 04/25/18 11:53 AM  Result Value Ref Range   Specimen Description BLOOD LEFT ANTECUBITAL    Special Requests      BOTTLES DRAWN AEROBIC ONLY Blood Culture adequate volume   Culture      NO GROWTH < 24 HOURS Performed at [facility], [address], [place], [place] [address]    Report Status PENDING   CBC with Differential/Platelet     Status: Abnormal   Collection Time: 04/26/18  5:43 AM  Result Value Ref Range   WBC 5.8 4.0 - 10.5 K/uL   RBC 3.79 (L) 4.22 - 5.81 MIL/uL   Hemoglobin 12.5 (L) 13.0 - 17.0 g/dL   HCT 82.9 (L) 56.2 - 13.0 %   MCV 91.3 78.0 - 100.0 fL   MCH 33.0 26.0 - 34.0 pg   MCHC 36.1 (H) 30.0 - 36.0 g/dL   RDW 86.5 78.4 - 69.6 %   Platelets 317 150 - 400 K/uL   Neutrophils Relative % 61 %   Neutro Abs 3.6 1.7 - 7.7 K/uL   Lymphocytes Relative 29 %   Lymphs Abs 1.7 0.7 - 4.0 K/uL   Monocytes Relative 6 %   Monocytes Absolute 0.4 0.1 - 1.0 K/uL   Eosinophils Relative 2 %   Eosinophils Absolute 0.1 0.0 - 0.7 K/uL   Basophils Relative 1 %   Basophils Absolute 0.0 0.0 - 0.1 K/uL   Immature Granulocytes 1 %   Abs Immature Granulocytes 0.0 0.0 - 0.1 K/uL  Basic metabolic panel     Status: Abnormal   Collection Time: 04/26/18  5:43 AM  Result Value Ref Range   Sodium 135 135 - 145 mmol/L   Potassium 5.7 (H) 3.5 - 5.1 mmol/L   Chloride 111 98 - 111 mmol/L   CO2 21 (L) 22 - 32 mmol/L   Glucose, Bld 96 70 - 99 mg/dL   BUN <5 (L) 6 - 20 mg/dL   Creatinine, Ser 2.95 0.61 - 1.24 mg/dL   Calcium 7.4 (L) 8.9 - 10.3 mg/dL   GFR calc non Af Amer >60 >60 mL/min   GFR calc Af Amer >60 >60 mL/min   Anion gap 3 (L) 5 - 15   Ct Angio Head W Or Wo Contrast  Result Date: 04/25/2018 CLINICAL DATA:  LEFT-sided weakness, slurred speech. Last seen normal at 2330 hours. Follow up code stroke. EXAM: CT ANGIOGRAPHY HEAD AND NECK TECHNIQUE: Multidetector CT imaging of the head and neck was performed using the standard protocol during bolus administration of intravenous contrast. Multiplanar CT image reconstructions and MIPs were obtained to evaluate the vascular anatomy. Carotid stenosis measurements (when applicable) are obtained utilizing NASCET criteria, using the distal internal carotid diameter as the denominator. CONTRAST:   50mL ISOVUE-370 IOPAMIDOL (ISOVUE-370) INJECTION 76% COMPARISON:  MRA head October 06, 2012 and CT HEAD April 25, 2018. FINDINGS: CTA NECK FINDINGS: AORTIC ARCH: Normal appearance of the thoracic arch, 2 vessel arch is a normal variant. The origins of the innominate, left Common  carotid artery and subclavian artery are widely patent. RIGHT CAROTID SYSTEM: Common carotid artery is patent, extrinsic compression by laryngeal cartilage. Normal appearance of the carotid bifurcation without hemodynamically significant stenosis by NASCET criteria. Mild luminal irregularity proximal internal carotid artery most compatible with atherosclerosis. Tonsillar loop. LEFT CAROTID SYSTEM: Common carotid artery is patent. Normal appearance of the carotid bifurcation without hemodynamically significant stenosis by NASCET criteria. Normal appearance of the internal carotid artery. VERTEBRAL ARTERIES:RIGHT vertebral artery is dominant. Normal appearance of the vertebral arteries, widely patent. SKELETON: No acute osseous process though bone windows have not been submitted. Poor dentition. Moderate cervical spondylosis. OTHER NECK: Soft tissues of the neck are nonacute though, not tailored for evaluation. UPPER CHEST: Included lung apices are clear. No superior mediastinal lymphadenopathy. CTA HEAD FINDINGS: ANTERIOR CIRCULATION: Patent cervical internal carotid arteries, petrous, cavernous and supra clinoid internal carotid arteries. Patent anterior communicating artery. Occluded distal RIGHT M1 segment with intermediate collateralization. Chronically occluded LEFT MCA with Intermedia collaterals. No large vessel occlusion, significant stenosis, contrast extravasation or aneurysm. POSTERIOR CIRCULATION: Patent vertebral arteries, vertebrobasilar junction and basilar artery, as well as main branch vessels. Patent posterior cerebral arteries. Robust bilateral posterior communicating arteries present. No large vessel occlusion, significant  stenosis, contrast extravasation or aneurysm. VENOUS SINUSES: Major dural venous sinuses are patent though not tailored for evaluation on this angiographic examination. ANATOMIC VARIANTS: None. DELAYED PHASE: Not performed. MIP images reviewed. IMPRESSION: CTA NECK: 1. No hemodynamically significant stenosis ICA's. 2. Patent vertebral arteries. CTA HEAD: 1. Emergent RIGHT M1 occlusion with intermediate collaterals. 2. 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 Critical Value/emergent results text paged to Dr.SUSHANTH AROOR via AMION secure system on 04/25/2018 at 4:40 am, including interpreting physician's phone number. Electronically Signed   By: Awilda Metro M.D.   On: 04/25/2018 04:48   Ct Angio Neck W Or Wo Contrast  Result Date: 04/25/2018 CLINICAL DATA:  LEFT-sided weakness, slurred speech. Last seen normal at 2330 hours. Follow up code stroke. EXAM: CT ANGIOGRAPHY HEAD AND NECK TECHNIQUE: Multidetector CT imaging of the head and neck was performed using the standard protocol during bolus administration of intravenous contrast. Multiplanar CT image reconstructions and MIPs were obtained to evaluate the vascular anatomy. Carotid stenosis measurements (when applicable) are obtained utilizing NASCET criteria, using the distal internal carotid diameter as the denominator. CONTRAST:  50mL ISOVUE-370 IOPAMIDOL (ISOVUE-370) INJECTION 76% COMPARISON:  MRA head October 06, 2012 and CT HEAD April 25, 2018. FINDINGS: CTA NECK FINDINGS: AORTIC ARCH: Normal appearance of the thoracic arch, 2 vessel arch is a normal variant. The origins of the innominate, left Common carotid artery and subclavian artery are widely patent. RIGHT CAROTID SYSTEM: Common carotid artery is patent, extrinsic compression by laryngeal cartilage. Normal appearance of the carotid bifurcation without hemodynamically significant stenosis by NASCET criteria. Mild luminal irregularity proximal internal carotid artery most compatible  with atherosclerosis. Tonsillar loop. LEFT CAROTID SYSTEM: Common carotid artery is patent. Normal appearance of the carotid bifurcation without hemodynamically significant stenosis by NASCET criteria. Normal appearance of the internal carotid artery. VERTEBRAL ARTERIES:RIGHT vertebral artery is dominant. Normal appearance of the vertebral arteries, widely patent. SKELETON: No acute osseous process though bone windows have not been submitted. Poor dentition. Moderate cervical spondylosis. OTHER NECK: Soft tissues of the neck are nonacute though, not tailored for evaluation. UPPER CHEST: Included lung apices are clear. No superior mediastinal lymphadenopathy. CTA HEAD FINDINGS: ANTERIOR CIRCULATION: Patent cervical internal carotid arteries, petrous, cavernous and supra clinoid internal carotid arteries. Patent anterior communicating artery. Occluded  distal RIGHT M1 segment with intermediate collateralization. Chronically occluded LEFT MCA with Intermedia collaterals. No large vessel occlusion, significant stenosis, contrast extravasation or aneurysm. POSTERIOR CIRCULATION: Patent vertebral arteries, vertebrobasilar junction and basilar artery, as well as main branch vessels. Patent posterior cerebral arteries. Robust bilateral posterior communicating arteries present. No large vessel occlusion, significant stenosis, contrast extravasation or aneurysm. VENOUS SINUSES: Major dural venous sinuses are patent though not tailored for evaluation on this angiographic examination. ANATOMIC VARIANTS: None. DELAYED PHASE: Not performed. MIP images reviewed. IMPRESSION: CTA NECK: 1. No hemodynamically significant stenosis ICA's. 2. Patent vertebral arteries. CTA HEAD: 1. Emergent RIGHT M1 occlusion with intermediate collaterals. 2. 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 Critical Value/emergent results text paged to Dr.SUSHANTH AROOR via AMION secure system on 04/25/2018 at 4:40 am, including interpreting  physician's phone number. Electronically Signed   By: Awilda Metro M.D.   On: 04/25/2018 04:48   Mr Brain Wo Contrast  Result Date: 04/26/2018 CLINICAL DATA:  47 y/o M; stroke post mechanical thrombectomy of right MCA M1 occlusion with revascularization. EXAM: MRI HEAD WITHOUT CONTRAST TECHNIQUE: Multiplanar, multiecho pulse sequences of the brain and surrounding structures were obtained without intravenous contrast. COMPARISON:  04/25/2018 cerebral angiogram. 04/25/2018 CT head, CTA head, CT perfusion. FINDINGS: Brain: Reduced diffusion is present within the right putamen and mid caudate body as well as approximately 10 mm foci within the right frontal operculum and right middle frontal gyrus centrum semiovale compatible with acute/early subacute infarction. Additionally, there is a punctate focus of infarction within the left posterior temporal periventricular white matter. No associated hemorrhage or mass effect. Chronic lacunar infarction involving the left putamen and corona radiata. Mild chronic microvascular ischemic changes of white matter and volume loss of the brain. No extra-axial collection, hydrocephalus, effacement of basilar cisterns, or herniation. Susceptibility weighted sequences are motion degraded, no gross intracranial hemorrhage. Vascular: Normal flow voids. Skull and upper cervical spine: Normal marrow signal. Sinuses/Orbits: Mild diffuse paranasal sinus mucosal thickening. No abnormal signal of mastoid air cells. Orbits are unremarkable. Other: None. IMPRESSION: 1. Small areas of acute/early subacute infarction involving the right putamen, right mid caudate body, right frontal operculum, and the right middle frontal gyrus white matter. Additional punctate focus of acute/early subacute infarction within the left posterior temporal periventricular white matter. No associated hemorrhage or mass effect. 2. Chronic lacunar infarct of the left basal ganglia. 3. Mild chronic microvascular  ischemic changes and volume loss of the brain. 4. Mild paranasal sinus mucosal thickening. Electronically Signed   By: Mitzi Hansen M.D.   On: 04/26/2018 01:24   Ct Cerebral Perfusion W Contrast  Result Date: 04/25/2018 CLINICAL DATA:  Follow up RIGHT M1 occlusion. LEFT-sided deficits, slurred speech. EXAM: CT PERFUSION BRAIN TECHNIQUE: Multiphase CT imaging of the brain was performed following IV bolus contrast injection. Subsequent parametric perfusion maps were calculated using RAPID software. CONTRAST:  40mL ISOVUE-370 IOPAMIDOL (ISOVUE-370) INJECTION 76% COMPARISON:  CT angiogram head April 25, 2018 at 0435 hours. FINDINGS: Moderately motion degraded examination. CBF (<30%) Volume: 0mL Perfusion (Tmax>6.0s) volume: 52mL Mismatch Volume: 52mL ASPECTS on noncontrast CT Head: 9 at 0426 hours today. Infarct Core: 0 mL Infarction Location:RIGHT frontal parietal lobes; limited is assessment due to ipsilateral RIGHT MCA sampling, with chronic LEFT M1 occlusion. IMPRESSION: 1. RIGHT frontoparietal/MCA territory brain at risk without core infarct though, limited by patient motion and bilateral occluded MCA. Electronically Signed   By: Awilda Metro M.D.   On: 04/25/2018 05:12   Dg Chest [facility]  Result Date: 04/26/2018 CLINICAL DATA:  Acute respiratory failure, hypoxia, history of stroke EXAM: PORTABLE CHEST 1 VIEW COMPARISON:  Portable chest x-ray of 04/25/2018 and two-view chest x-ray of 10/06/2012 FINDINGS: The endotracheal tube has been removed as is the NG tube. The lungs appear well aerated. Mediastinal and hilar contours are unremarkable and heart size is stable. No bony abnormality is seen. IMPRESSION: 1. Removal of endotracheal tube and NG tube. 2. Improved aeration.  No active lung disease. Electronically Signed   By: Dwyane Dee M.D.   On: 04/26/2018 08:26   Dg Chest Port 1 View  Result Date: 04/25/2018 CLINICAL DATA:  Intubation, endotracheal tube position EXAM: PORTABLE  CHEST 1 VIEW COMPARISON:  Portable exam 0956 hours compared to 10/06/2012 FINDINGS: Tip of endotracheal tube projects 2.8 cm above carina. Nasogastric tube extends into stomach. Normal heart size, mediastinal contours, and pulmonary vascularity. Minimal peribronchial thickening. No acute infiltrate, pleural effusion or pneumothorax. Bones unremarkable. IMPRESSION: No acute abnormalities. Electronically Signed   By: Ulyses Southward M.D.   On: 04/25/2018 10:21   Dg Abd Portable 1v  Result Date: 04/25/2018 CLINICAL DATA:  OG tube position EXAM: PORTABLE ABDOMEN - 1 VIEW COMPARISON:  None. FINDINGS: OG tube tip is in the proximal to mid stomach. Nonobstructive bowel gas pattern with moderate stool in the colon. IMPRESSION: OG tube tip in the stomach. Electronically Signed   By: Charlett Nose M.D.   On: 04/25/2018 10:16   Ct Head Code Stroke Wo Contrast  Result Date: 04/25/2018 CLINICAL DATA:  Code stroke. LEFT-sided weakness, slurred speech. History of stroke and hypertension. EXAM: CT HEAD WITHOUT CONTRAST TECHNIQUE: Contiguous axial images were obtained from the base of the skull through the vertex without intravenous contrast. COMPARISON:  MRI head October 06, 2012 FINDINGS: BRAIN: No intraparenchymal hemorrhage, mass effect nor midline shift. Old LEFT basal ganglia infarct with ex vacuo dilatation LEFT lateral ventricle. Focal blurring RIGHT frontal gray-white matter junction. No abnormal extra-axial fluid collections. Basal cisterns are patent. VASCULAR: Mildly dense RIGHT MCA versus artifact. SKULL/SOFT TISSUES: No skull fracture. No significant soft tissue swelling. ORBITS/SINUSES: The included ocular globes and orbital contents are normal.Trace paranasal sinus mucosal thickening. Mastoid air cells are well aerated. OTHER: None. ASPECTS [facility] Stroke Program Early CT Score) - Ganglionic level infarction (caudate, lentiform nuclei, internal capsule, insula, M1-M3 cortex): 6 - Supraganglionic infarction (M4-M6  cortex): 3 Total score (0-10 with 10 being normal): 9 IMPRESSION: 1. Mildly dense RIGHT MCA concerning for thromboembolism. 2. Small age-indeterminate RIGHT frontal infarct, new from 2014. 3. Old LEFT basal ganglia infarct. 4. ASPECTS is 9. 5. Critical Value/emergent results text paged to Dr.AROOR, Neurology via AMION secure system on 04/25/2018 at 4:36 am, including interpreting physician's phone number. Electronically Signed   By: Awilda Metro M.D.   On: 04/25/2018 04:37    Assessment/Plan: Diagnosis: R MCA infarcts  1. Does the need for close, 24 hr/day medical supervision in concert with the patient's rehab needs make it unreasonable for this patient to be served in a less intensive setting? Potentially 2. Co-Morbidities requiring supervision/potential complications: gout, hx polysubstance abuse 3. Due to bladder management, bowel management, safety, skin/wound care, disease management, medication administration, pain management and patient education, does the patient require 24 hr/day rehab nursing? Potentially 4. Does the patient require coordinated care of a physician, rehab nurse, PT (1-2 hrs/day, 5 days/week) and OT (1-2 hrs/day, 5 days/week) to address physical and functional deficits in the context of the above medical diagnosis(es)? Yes Addressing deficits in  the following areas: balance, endurance, locomotion, strength, transferring, bowel/bladder control, bathing, dressing, feeding and cognition 5. Can the patient actively participate in an intensive therapy program of at least 3 hrs of therapy per day at least 5 days per week? Yes 6. The potential for patient to make measurable gains while on inpatient rehab is may be limited due to high level 7. Anticipated functional outcomes upon discharge from inpatient rehab are modified independent  with PT, modified independent with OT, modified independent with SLP. 8. Estimated rehab length of stay to reach the above functional goals is: need  PT, OT evals once of bedrest 9. Anticipated D/C setting: Home 10. Anticipated post D/C treatments: HH therapy 11. Overall Rehab/Functional Prognosis: excellent  RECOMMENDATIONS: This patient's condition is appropriate for continued rehabilitative care in the following setting: CIR and if pt requiring at least min A physically Patient has agreed to participate in recommended program. Yes Note that insurance prior authorization may be required for reimbursement for recommended care.  Comment: Need to review PT, OT evals prior to CIR decision   "I have personally performed a face to face diagnostic evaluation of this patient.  Additionally, I have reviewed and concur with the physician assistant's documentation above."  Erick Colace M.D. [facility] FAAPM&R (Sports Med, Neuromuscular Med) Diplomate Am Board of Electrodiagnostic Med  Jacquelynn Cree, PA-C 04/26/2018

## 2018-04-26 NOTE — Evaluation (Signed)
Speech Language Pathology Evaluation Patient Details Name: Manuel Harrison MRN: [id number] DOB: [birth year] Today's Date: 04/26/2018 Time: 4098-1191 SLP Time Calculation (min) (ACUTE ONLY): 27 min  Problem List:  Patient Active Problem List   Diagnosis Date Noted  . Acute right arterial ischemic stroke, middle cerebral artery (MCA) (HCC) 04/25/2018  . 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 Acute respiratory failure with hypoxia (HCC)   . Osteoarthritis of left knee 11/24/2015  . Essential hypertension 11/24/2015  . Dry cough 11/24/2015  . Seasonal allergies 11/24/2015  . Dyslipidemia 01/30/2013  . Acute ischemic stroke (HCC) 10/07/2012  . Tobacco abuse disorder 10/06/2012  . History of cocaine use 10/06/2012   Past Medical History:  Past Medical History:  Diagnosis Date  . Hypertension   . Stroke [facility])    Past Surgical History:  Past Surgical History:  Procedure Laterality Date  . RADIOLOGY WITH ANESTHESIA N/A 04/25/2018   Procedure: RADIOLOGY WITH ANESTHESIA;  Surgeon: Julieanne Cotton, MD;  Location: [facility] OR;  Service: Radiology;  Laterality: N/A;   HPI:  Manuel Harrison is an 47 y.o. male with past medical history of left MCA stroke (2014), substance abuse (cocaine), smoker, hypertension, who presented to [facility] ED on 10/1 with acute onset left-sided plegia, gaze deviation consistent with right MCA stroke. Per chart, pt was intubated and extubated same day (10/1). ST saw this pt for dysarthric speech following his stoke in 2014. MRI = Small areas of acute/early subacute infarction involving the right putamen, right mid caudate body, right frontal operculum, and the right middle frontal gyrus white matter; additional punctate focus of acute/early subacute infarction within the left posterior temporal periventricular white matter; no associated hemorrhage or mass effect; chronic lacunar infarct of the left basal ganglia; mild chronic microvascular ischemic changes and  volume loss of the brain. CXR = no acute abnormalities   Assessment / Plan / Recommendation Clinical Impression  Pt was alert, pleasant and oriented X4 during today's evaluation. He completed the [facility] Cognitive Assessment [facility]) with a standard score of 21/30, which reflects a mild-mod cognitive impairment according to this standardized measure. Pt demonstrated slightly impaired intellectual awareness, as he reported baseline memory and speech deficits, but did not perceieve further decline in cognitive functioning since this hospital admission. His wife at bedside concurred with baseline deficits, however she stated pt's memory impairment has been exacerbated by right MCA. She also noted pt's intermittent periods of rushed speech. While pt was 90% intelligible to this skilled listener, SLP trained pt to use speech rate reduction to further increase overall intelligibility. Although he was slightly distractible (frequently turned his head to observe activity outside his room, adjusted himself in bed, etc.), pt independently self-monitored his behavior appropriately and exhibited focused, sustained and selective attention [facility] during tasks assessed. Short term memory tasks presented the greatest challenges, as he was unable to recall learned words and repeat complex sentences 100% accurately without min verbal cues. Despite working memory and storage deficits presented during these tasks, pt also demonstrated attempts to self-correct, functional comprehension of complex sentences and extremely minimal verbal cues were effective to prompt pt's accurate responses and retrieval of information. SLP edcuated pt/family regardig memory strategies including note taking, use of calendar, and pt's wife relayed her plan to provide max assistance with memory upon the pt's d/c to home. Given pt's demonstrated of learning compensatory strategies for memory deficits and degree of family support, no further ST is recommended  at this time.    SLP Assessment  SLP Recommendation/Assessment: Patient does not need any further Speech Lanaguage Pathology Services SLP Visit Diagnosis: Cognitive communication deficit (R41.841)    Follow Up Recommendations  None    Frequency and Duration           SLP Evaluation Cognition  Overall Cognitive Status: Impaired/Different from baseline Arousal/Alertness: Awake/alert Orientation Level: Oriented X4 Attention: Focused;Sustained;Selective Focused Attention: Appears intact Sustained Attention: Appears intact(slightly distractible but self-monitors independently) Selective Attention: Appears intact Memory: Impaired Memory Impairment: Storage deficit;Decreased short term memory Decreased Short Term Memory: Verbal basic Awareness: Impaired Awareness Impairment: Intellectual impairment Problem Solving: Appears intact Executive Function: Self Monitoring;Self Correcting;Initiating Initiating: Appears intact Self Monitoring: Appears intact Self Correcting: Appears intact Safety/Judgment: Appears intact       Comprehension  Auditory Comprehension Overall Auditory Comprehension: Appears within functional limits for tasks assessed Yes/No Questions: Not tested Commands: Not tested Conversation: Simple Interfering Components: Attention;Working memory EffectiveTechniques: Visual/Gestural cues;Extra processing time Visual Recognition/Discrimination Discrimination: Not tested Reading Comprehension Reading Status: Not tested    Expression Expression Primary Mode of Expression: Verbal Verbal Expression Overall Verbal Expression: Appears within functional limits for tasks assessed Initiation: No impairment Level of Generative/Spontaneous Verbalization: Conversation;Word;Sentence;Phrase Repetition: No impairment Naming: No impairment Pragmatics: No impairment Written Expression Dominant Hand: Right Written Expression: Not tested   Oral / Motor  Oral Motor/Sensory  Function Overall Oral Motor/Sensory Function: Generalized oral weakness Facial ROM: Within Functional Limits Facial Symmetry: Abnormal symmetry left Facial Strength: Reduced left Mandible: Within Functional Limits Motor Speech Overall Motor Speech: Impaired at baseline(intermittent periods of increased rate of speech) Respiration: Within functional limits Phonation: Normal Resonance: Within functional limits Articulation: Within functional limitis Intelligibility: Intelligible Motor Planning: Witnin functional limits Motor Speech Errors: Aware Effective Techniques: Slow rate   Suzzette Righter, Student SLP                     Suzzette Righter 04/26/2018, 10:50 AM

## 2018-04-26 NOTE — Evaluation (Signed)
Physical Therapy Evaluation Patient Details Name: Manuel Harrison MRN: [id number] DOB: 09-Nov-1970 Today's Date: 04/26/2018   History of Present Illness  Pt is a 47 y.o. male with past medical history of left MCA stroke, substance abuse (cocaine), smoker, hypertension admitted with an acute right MCA stroke, found to have a right M1 occlusion.  He went for interventional thrombectomy and revascularization with stent placed. Postprocedure, MRI of brain done revealing small areas of acute/early subacute infarct involving right putamen, right mid carotid body right frontal operculum and right middle frontal gyrus white matter as well as punctate focus left posterior temporal lobe.   Clinical Impression  Pt with some mild cognitive deficits and mild strength deficits in his left upper and lower extremity.  His leg weakness did not seem to affect his gait all that much and he was min guard assist for hallway ambulation without an assistive device.  PT will follow acutely, but I do not anticipate he will need PT f/u at discharge.   PT to follow acutely for deficits listed below.       Follow Up Recommendations No PT follow up;Supervision - Intermittent    Equipment Recommendations  None recommended by PT    Recommendations for Other Services    NA    Precautions / Restrictions Precautions Precautions: Fall Restrictions Weight Bearing Restrictions: No      Mobility  Bed Mobility Overal bed mobility: Needs Assistance Bed Mobility: Supine to Sit     Supine to sit: Min guard     General bed mobility comments: for safety/lines; no physical assist required  Transfers Overall transfer level: Needs assistance Equipment used: None Transfers: Sit to/from Stand Sit to Stand: Min guard         General transfer comment: close minguard for safety and for balance upon immediate standing  Ambulation/Gait Ambulation/Gait assistance: Min guard Gait Distance (Feet): 120 Feet Assistive  device: None Gait Pattern/deviations: Step-through pattern;Staggering left;Staggering right     General Gait Details: very mildly staggering gait pattern and despite tested L LE weakness and a shade slower coordination, pt did not show signs of foot drag, instability or ataxia on this side during gait. He was scanning his environment quite a bit with no significant LOB noted.  Min guard assist for safety and balance on his first walk into the hallway.       Modified Rankin (Stroke Patients Only) Modified Rankin (Stroke Patients Only) Pre-Morbid Rankin Score: No significant disability Modified Rankin: Moderately severe disability     Balance Overall balance assessment: Needs assistance Sitting-balance support: Feet supported;No upper extremity supported Sitting balance-Leahy Scale: Good     Standing balance support: Single extremity supported;No upper extremity supported;During functional activity Standing balance-Leahy Scale: Fair                               Pertinent Vitals/Pain Pain Assessment: Faces Pain Score: 2  Pain Location: R groin where IR sheath was Pain Descriptors / Indicators: Grimacing Pain Intervention(s): Monitored during session;Limited activity within patient's tolerance;Repositioned    Home Living Family/patient expects to be discharged to:: Private residence Living Arrangements: Spouse/significant other Available Help at Discharge: Family Type of Home: House Home Access: Ramped entrance     Home Layout: One level Home Equipment: Gilmer Mor - single point      Prior Function Level of Independence: Independent         Comments: works as a Investment banker, operational at [employer]  Hand Dominance   Dominant Hand: Right    Extremity/Trunk Assessment   Upper Extremity Assessment Upper Extremity Assessment: Defer to OT evaluation LUE Deficits / Details: strength grossly 4-/5, mild discoordination noted LUE Sensation: decreased light touch LUE  Coordination: decreased fine motor    Lower Extremity Assessment Lower Extremity Assessment: LLE deficits/detail LLE Deficits / Details: left leg mildly weaker and mildly less coordinated than R leg, but despite bedside testing, he looke pretty good functionally during gait. 4/5 strength throughout L Leg and 5/5 right.  LLE Sensation: WNL LLE Coordination: decreased fine motor    Cervical / Trunk Assessment Cervical / Trunk Assessment: Normal  Communication   Communication: No difficulties  Cognition Arousal/Alertness: Awake/alert Behavior During Therapy: WFL for tasks assessed/performed Overall Cognitive Status: Impaired/Different from baseline Area of Impairment: Memory                     Memory: Decreased short-term memory         General Comments: pt A&O, able to report PLOF without difficulty; cognition overall appearing WFL during functional tasks, in speaking with SLP pt with STM deficits, will continue to assess             Assessment/Plan    PT Assessment Patient needs continued PT services  PT Problem List Decreased strength;Decreased activity tolerance;Decreased balance;Decreased mobility;Decreased coordination;Decreased cognition;Decreased knowledge of use of DME;Pain       PT Treatment Interventions DME instruction;Gait training;Stair training;Functional mobility training;Therapeutic activities;Therapeutic exercise;Balance training;Patient/family education    PT Goals (Current goals can be found in the Care Plan section)  Acute Rehab PT Goals Patient Stated Goal: hopeful to return home soon; regain strength in L side PT Goal Formulation: With patient Time For Goal Achievement: 05/10/18 Potential to Achieve Goals: Good    Frequency Min 4X/week        Co-evaluation PT/OT/SLP Co-Evaluation/Treatment: Yes Reason for Co-Treatment: For patient/therapist safety;To address functional/ADL transfers PT goals addressed during session: Mobility/safety  with mobility;Balance;Strengthening/ROM OT goals addressed during session: ADL's and self-care;Strengthening/ROM       AM-PAC PT "6 Clicks" Daily Activity  Outcome Measure Difficulty turning over in bed (including adjusting bedclothes, sheets and blankets)?: A Little Difficulty moving from lying on back to sitting on the side of the bed? : Unable Difficulty sitting down on and standing up from a chair with arms (e.g., wheelchair, bedside commode, etc,.)?: Unable Help needed moving to and from a bed to chair (including a wheelchair)?: A Little Help needed walking in hospital room?: A Little Help needed climbing 3-5 steps with a railing? : A Little 6 Click Score: 14    End of Session Equipment Utilized During Treatment: Gait belt Activity Tolerance: Patient tolerated treatment well Patient left: in chair;with call bell/phone within reach;with family/visitor present Nurse Communication: Mobility status PT Visit Diagnosis: Unsteadiness on feet (R26.81);Hemiplegia and hemiparesis Hemiplegia - Right/Left: Left Hemiplegia - dominant/non-dominant: Non-dominant Hemiplegia - caused by: Cerebral infarction    Time: 9604-5409 PT Time Calculation (min) (ACUTE ONLY): 19 min   Charges:         Lurena Joiner B. Senetra Dillin, PT, DPT  Acute Rehabilitation #[phone] pager #[phone] office   PT Evaluation $PT Eval Moderate Complexity: 1 Mod          04/26/2018, 1:50 PM

## 2018-04-26 NOTE — Progress Notes (Signed)
Inpatient Rehabilitation Admissions Coordinator  Pt no longer in need for an inpt rehab admission. I have notified RN CM. We will sign off at this time.  Ottie Glazier, RN, MSN Rehab Admissions Coordinator [phone] 04/26/2018 4:10 PM

## 2018-04-26 NOTE — Progress Notes (Signed)
Patient arrived to the unit Alert and Oriented X 4 Denies Pain Right groin incision, Open to air. Clean dry and intact. All questions and concerns addressed. Bed in the lowest position with alarm set and call light in reach. Nurse will continue to monitor.

## 2018-04-26 NOTE — Progress Notes (Signed)
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 Corliss Skains. Patient awake and alert sitting in bed with no complaints at this time. Accompanied by girlfriend at bedside. Spontaneously moving all extremities. Right groin incision c/d/i.   Allergies: Aleve [naproxen sodium]  Medications: Prior to Admission medications   Medication Sig Start Date End Date Taking? Authorizing Provider  atorvastatin (LIPITOR) 10 MG tablet Take 1 tablet (10 mg total) by mouth daily at 6 PM. Patient not taking: Reported on 09/29/2016 11/24/15   Ruben Im, MD  diclofenac sodium (VOLTAREN) 1 % GEL Apply 4 g topically 4 (four) times daily. Patient not taking: Reported on 09/29/2016 11/24/15   Ruben Im, MD  doxycycline (VIBRAMYCIN) 100 MG capsule Take 1 capsule (100 mg total) by mouth 2 (two) times daily. One po bid x 7 days Patient not taking: Reported on 09/29/2016 02/01/16   Street, [place], PA-C  hydrochlorothiazide (MICROZIDE) 12.5 MG capsule Take 1 capsule (12.5 mg total) by mouth daily. Patient not taking: Reported on 09/29/2016 11/24/15 11/23/16  Ruben Im, MD  HYDROcodone-acetaminophen [facility]) 5-325 MG tablet Take 1-2 tablets by mouth every 6 (six) hours as needed for severe pain. Patient not taking: Reported on 09/29/2016 02/01/16   Street, [place], PA-C  loratadine (CLARITIN) 10 MG tablet Take 1 tablet (10 mg total) by mouth daily. Patient not taking: Reported on 09/29/2016 11/24/15 11/23/16  Ruben Im, MD  naproxen (NAPROSYN) 500 MG tablet Take 1 tablet (500 mg total) by mouth 2 (two) times daily as needed for mild pain, moderate pain or headache (TAKE WITH MEALS.). Patient not taking: Reported on 09/29/2016 02/01/16   Street, [place], PA-C  ondansetron (ZOFRAN) 4 MG tablet Take 1 tablet (4 mg  total) by mouth every 6 (six) hours as needed for nausea or vomiting. Patient not taking: Reported on 04/25/2018 09/29/16   Lavera Guise, MD     Vital Signs: BP 102/84 (BP Location: Right Arm)   Pulse 83   Temp 98.3 F (36.8 C) (Axillary)   Resp 20   Ht 5\' 8"  (1.727 m)   SpO2 100%   BMI 24.33 kg/m   Physical Exam  Constitutional: He appears well-developed and well-nourished. No distress.  Pulmonary/Chest: Effort normal. No respiratory distress.  Neurological:  Alert, awake, and oriented x3. Speech and comprehension intact. PERRL bilaterally. EOMs intact bilaterally without nystagmus or subjective diplopia. Visual fields not assessed. No facial asymmetry. Tongue midline. Spontaneously moving all extremities. No pronator drift. Fine motor and coordination intact and symmetric. Gait not assessed. Romberg not assessed. Heel to toe not assessed. Distal pulses 2+ bilaterally.  Skin: Skin is warm and dry.  Right groin incision soft without active bleeding or hematoma.  Psychiatric: He has a normal mood and affect. His behavior is normal. Judgment and thought content normal.  Nursing note and vitals reviewed.   Imaging: Ct Angio Head W Or Wo Contrast  Result Date: 04/25/2018 CLINICAL DATA:  LEFT-sided weakness, slurred speech. Last seen normal at 2330 hours. Follow up code stroke. EXAM: CT ANGIOGRAPHY HEAD AND NECK TECHNIQUE: Multidetector CT imaging of the head and neck was performed using the standard protocol during bolus administration of intravenous contrast. Multiplanar CT image reconstructions and MIPs were obtained to evaluate the vascular anatomy. Carotid stenosis measurements (when applicable) are obtained utilizing NASCET criteria, using the distal internal carotid  diameter as the denominator. CONTRAST:  50mL ISOVUE-370 IOPAMIDOL (ISOVUE-370) INJECTION 76% COMPARISON:  MRA head October 06, 2012 and CT HEAD April 25, 2018. FINDINGS: CTA NECK FINDINGS: AORTIC ARCH: Normal  appearance of the thoracic arch, 2 vessel arch is a normal variant. The origins of the innominate, left Common carotid artery and subclavian artery are widely patent. RIGHT CAROTID SYSTEM: Common carotid artery is patent, extrinsic compression by laryngeal cartilage. Normal appearance of the carotid bifurcation without hemodynamically significant stenosis by NASCET criteria. Mild luminal irregularity proximal internal carotid artery most compatible with atherosclerosis. Tonsillar loop. LEFT CAROTID SYSTEM: Common carotid artery is patent. Normal appearance of the carotid bifurcation without hemodynamically significant stenosis by NASCET criteria. Normal appearance of the internal carotid artery. VERTEBRAL ARTERIES:RIGHT vertebral artery is dominant. Normal appearance of the vertebral arteries, widely patent. SKELETON: No acute osseous process though bone windows have not been submitted. Poor dentition. Moderate cervical spondylosis. OTHER NECK: Soft tissues of the neck are nonacute though, not tailored for evaluation. UPPER CHEST: Included lung apices are clear. No superior mediastinal lymphadenopathy. CTA HEAD FINDINGS: ANTERIOR CIRCULATION: Patent cervical internal carotid arteries, petrous, cavernous and supra clinoid internal carotid arteries. Patent anterior communicating artery. Occluded distal RIGHT M1 segment with intermediate collateralization. Chronically occluded LEFT MCA with Intermedia collaterals. No large vessel occlusion, significant stenosis, contrast extravasation or aneurysm. POSTERIOR CIRCULATION: Patent vertebral arteries, vertebrobasilar junction and basilar artery, as well as main branch vessels. Patent posterior cerebral arteries. Robust bilateral posterior communicating arteries present. No large vessel occlusion, significant stenosis, contrast extravasation or aneurysm. VENOUS SINUSES: Major dural venous sinuses are patent though not tailored for evaluation on this angiographic examination.  ANATOMIC VARIANTS: None. DELAYED PHASE: Not performed. MIP images reviewed. IMPRESSION: CTA NECK: 1. No hemodynamically significant stenosis ICA's. 2. Patent vertebral arteries. CTA HEAD: 1. Emergent RIGHT M1 occlusion with intermediate collaterals. 2. 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 Critical Value/emergent results text paged to Dr.SUSHANTH AROOR via AMION secure system on 04/25/2018 at 4:40 am, including interpreting physician's phone number. Electronically Signed   By: Awilda Metro M.D.   On: 04/25/2018 04:48   Ct Angio Neck W Or Wo Contrast  Result Date: 04/25/2018 CLINICAL DATA:  LEFT-sided weakness, slurred speech. Last seen normal at 2330 hours. Follow up code stroke. EXAM: CT ANGIOGRAPHY HEAD AND NECK TECHNIQUE: Multidetector CT imaging of the head and neck was performed using the standard protocol during bolus administration of intravenous contrast. Multiplanar CT image reconstructions and MIPs were obtained to evaluate the vascular anatomy. Carotid stenosis measurements (when applicable) are obtained utilizing NASCET criteria, using the distal internal carotid diameter as the denominator. CONTRAST:  50mL ISOVUE-370 IOPAMIDOL (ISOVUE-370) INJECTION 76% COMPARISON:  MRA head October 06, 2012 and CT HEAD April 25, 2018. FINDINGS: CTA NECK FINDINGS: AORTIC ARCH: Normal appearance of the thoracic arch, 2 vessel arch is a normal variant. The origins of the innominate, left Common carotid artery and subclavian artery are widely patent. RIGHT CAROTID SYSTEM: Common carotid artery is patent, extrinsic compression by laryngeal cartilage. Normal appearance of the carotid bifurcation without hemodynamically significant stenosis by NASCET criteria. Mild luminal irregularity proximal internal carotid artery most compatible with atherosclerosis. Tonsillar loop. LEFT CAROTID SYSTEM: Common carotid artery is patent. Normal appearance of the carotid bifurcation without hemodynamically  significant stenosis by NASCET criteria. Normal appearance of the internal carotid artery. VERTEBRAL ARTERIES:RIGHT vertebral artery is dominant. Normal appearance of the vertebral arteries, widely patent. SKELETON: No acute osseous process though bone windows have not been submitted. Poor  dentition. Moderate cervical spondylosis. OTHER NECK: Soft tissues of the neck are nonacute though, not tailored for evaluation. UPPER CHEST: Included lung apices are clear. No superior mediastinal lymphadenopathy. CTA HEAD FINDINGS: ANTERIOR CIRCULATION: Patent cervical internal carotid arteries, petrous, cavernous and supra clinoid internal carotid arteries. Patent anterior communicating artery. Occluded distal RIGHT M1 segment with intermediate collateralization. Chronically occluded LEFT MCA with Intermedia collaterals. No large vessel occlusion, significant stenosis, contrast extravasation or aneurysm. POSTERIOR CIRCULATION: Patent vertebral arteries, vertebrobasilar junction and basilar artery, as well as main branch vessels. Patent posterior cerebral arteries. Robust bilateral posterior communicating arteries present. No large vessel occlusion, significant stenosis, contrast extravasation or aneurysm. VENOUS SINUSES: Major dural venous sinuses are patent though not tailored for evaluation on this angiographic examination. ANATOMIC VARIANTS: None. DELAYED PHASE: Not performed. MIP images reviewed. IMPRESSION: CTA NECK: 1. No hemodynamically significant stenosis ICA's. 2. Patent vertebral arteries. CTA HEAD: 1. Emergent RIGHT M1 occlusion with intermediate collaterals. 2. 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 Critical Value/emergent results text paged to Dr.SUSHANTH AROOR via AMION secure system on 04/25/2018 at 4:40 am, including interpreting physician's phone number. Electronically Signed   By: Awilda Metro M.D.   On: 04/25/2018 04:48   Mr Brain Wo Contrast  Result Date: 04/26/2018 CLINICAL DATA:   47 y/o M; stroke post mechanical thrombectomy of right MCA M1 occlusion with revascularization. EXAM: MRI HEAD WITHOUT CONTRAST TECHNIQUE: Multiplanar, multiecho pulse sequences of the brain and surrounding structures were obtained without intravenous contrast. COMPARISON:  04/25/2018 cerebral angiogram. 04/25/2018 CT head, CTA head, CT perfusion. FINDINGS: Brain: Reduced diffusion is present within the right putamen and mid caudate body as well as approximately 10 mm foci within the right frontal operculum and right middle frontal gyrus centrum semiovale compatible with acute/early subacute infarction. Additionally, there is a punctate focus of infarction within the left posterior temporal periventricular white matter. No associated hemorrhage or mass effect. Chronic lacunar infarction involving the left putamen and corona radiata. Mild chronic microvascular ischemic changes of white matter and volume loss of the brain. No extra-axial collection, hydrocephalus, effacement of basilar cisterns, or herniation. Susceptibility weighted sequences are motion degraded, no gross intracranial hemorrhage. Vascular: Normal flow voids. Skull and upper cervical spine: Normal marrow signal. Sinuses/Orbits: Mild diffuse paranasal sinus mucosal thickening. No abnormal signal of mastoid air cells. Orbits are unremarkable. Other: None. IMPRESSION: 1. Small areas of acute/early subacute infarction involving the right putamen, right mid caudate body, right frontal operculum, and the right middle frontal gyrus white matter. Additional punctate focus of acute/early subacute infarction within the left posterior temporal periventricular white matter. No associated hemorrhage or mass effect. 2. Chronic lacunar infarct of the left basal ganglia. 3. Mild chronic microvascular ischemic changes and volume loss of the brain. 4. Mild paranasal sinus mucosal thickening. Electronically Signed   By: Mitzi Hansen M.D.   On: 04/26/2018  01:24   Ct Cerebral Perfusion W Contrast  Result Date: 04/25/2018 CLINICAL DATA:  Follow up RIGHT M1 occlusion. LEFT-sided deficits, slurred speech. EXAM: CT PERFUSION BRAIN TECHNIQUE: Multiphase CT imaging of the brain was performed following IV bolus contrast injection. Subsequent parametric perfusion maps were calculated using RAPID software. CONTRAST:  40mL ISOVUE-370 IOPAMIDOL (ISOVUE-370) INJECTION 76% COMPARISON:  CT angiogram head April 25, 2018 at 0435 hours. FINDINGS: Moderately motion degraded examination. CBF (<30%) Volume: 0mL Perfusion (Tmax>6.0s) volume: 52mL Mismatch Volume: 52mL ASPECTS on noncontrast CT Head: 9 at 0426 hours today. Infarct Core: 0 mL Infarction Location:RIGHT frontal parietal lobes; limited is assessment due  to ipsilateral RIGHT MCA sampling, with chronic LEFT M1 occlusion. IMPRESSION: 1. RIGHT frontoparietal/MCA territory brain at risk without core infarct though, limited by patient motion and bilateral occluded MCA. Electronically Signed   By: Awilda Metro M.D.   On: 04/25/2018 05:12   Dg Chest Port 1 View  Result Date: 04/26/2018 CLINICAL DATA:  Acute respiratory failure, hypoxia, history of stroke EXAM: PORTABLE CHEST 1 VIEW COMPARISON:  Portable chest x-ray of 04/25/2018 and two-view chest x-ray of 10/06/2012 FINDINGS: The endotracheal tube has been removed as is the NG tube. The lungs appear well aerated. Mediastinal and hilar contours are unremarkable and heart size is stable. No bony abnormality is seen. IMPRESSION: 1. Removal of endotracheal tube and NG tube. 2. Improved aeration.  No active lung disease. Electronically Signed   By: Dwyane Dee M.D.   On: 04/26/2018 08:26   Dg Chest Port 1 View  Result Date: 04/25/2018 CLINICAL DATA:  Intubation, endotracheal tube position EXAM: PORTABLE CHEST 1 VIEW COMPARISON:  Portable exam 0956 hours compared to 10/06/2012 FINDINGS: Tip of endotracheal tube projects 2.8 cm above carina. Nasogastric tube extends into  stomach. Normal heart size, mediastinal contours, and pulmonary vascularity. Minimal peribronchial thickening. No acute infiltrate, pleural effusion or pneumothorax. Bones unremarkable. IMPRESSION: No acute abnormalities. Electronically Signed   By: Ulyses Southward M.D.   On: 04/25/2018 10:21   Dg Abd Portable 1v  Result Date: 04/25/2018 CLINICAL DATA:  OG tube position EXAM: PORTABLE ABDOMEN - 1 VIEW COMPARISON:  None. FINDINGS: OG tube tip is in the proximal to mid stomach. Nonobstructive bowel gas pattern with moderate stool in the colon. IMPRESSION: OG tube tip in the stomach. Electronically Signed   By: Charlett Nose M.D.   On: 04/25/2018 10:16   Ct Head Code Stroke Wo Contrast  Result Date: 04/25/2018 CLINICAL DATA:  Code stroke. LEFT-sided weakness, slurred speech. History of stroke and hypertension. EXAM: CT HEAD WITHOUT CONTRAST TECHNIQUE: Contiguous axial images were obtained from the base of the skull through the vertex without intravenous contrast. COMPARISON:  MRI head October 06, 2012 FINDINGS: BRAIN: No intraparenchymal hemorrhage, mass effect nor midline shift. Old LEFT basal ganglia infarct with ex vacuo dilatation LEFT lateral ventricle. Focal blurring RIGHT frontal gray-white matter junction. No abnormal extra-axial fluid collections. Basal cisterns are patent. VASCULAR: Mildly dense RIGHT MCA versus artifact. SKULL/SOFT TISSUES: No skull fracture. No significant soft tissue swelling. ORBITS/SINUSES: The included ocular globes and orbital contents are normal.Trace paranasal sinus mucosal thickening. Mastoid air cells are well aerated. OTHER: None. ASPECTS [facility] Stroke Program Early CT Score) - Ganglionic level infarction (caudate, lentiform nuclei, internal capsule, insula, M1-M3 cortex): 6 - Supraganglionic infarction (M4-M6 cortex): 3 Total score (0-10 with 10 being normal): 9 IMPRESSION: 1. Mildly dense RIGHT MCA concerning for thromboembolism. 2. Small age-indeterminate RIGHT frontal  infarct, new from 2014. 3. Old LEFT basal ganglia infarct. 4. ASPECTS is 9. 5. Critical Value/emergent results text paged to Dr.AROOR, Neurology via AMION secure system on 04/25/2018 at 4:36 am, including interpreting physician's phone number. Electronically Signed   By: Awilda Metro M.D.   On: 04/25/2018 04:37    Labs:  CBC: Recent Labs    04/25/18 0422 04/25/18 0433 04/26/18 0543  WBC 6.7  --  5.8  HGB 15.2 14.6 12.5*  HCT 43.7 43.0 34.6*  PLT 305  --  317    COAGS: Recent Labs    04/25/18 0422  INR 0.90  APTT 35    BMP: Recent Labs  04/25/18 0422 04/25/18 0433 04/26/18 0543  NA 137 141 135  K 4.0 3.7 5.7*  CL 108 105 111  CO2 22  --  21*  GLUCOSE 136* 134* 96  BUN 9 8 <5*  CALCIUM 8.7*  --  7.4*  CREATININE 1.04 0.90 0.78  GFRNONAA >60  --  >60  GFRAA >60  --  >60    LIVER FUNCTION TESTS: Recent Labs    04/25/18 0422  BILITOT 0.8  AST 25  ALT 12  ALKPHOS 76  PROT 6.3*  ALBUMIN 4.0    Assessment and Plan:  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 Corliss Skains. Patient's condition stable- spontaneously moving all extremities. Right groin incision stable. Continue taking Brilinta 90 mg twice daily and Aspirin 81 mg once daily. Samples and coupon given. Plan to follow-up with Dr. Corliss Skains in clinic 4 weeks after discharge. Appreciate and agree with neurology management. Please call IR with questions/concerns.   Electronically Signed: Elwin Mocha, PA-C 04/26/2018, 9:18 AM   I spent a total of 15 Minutes at the the patient's bedside AND on the patient's hospital floor or unit, greater than 50% of which was counseling/coordinating care for right MCA M1 occlusion s/p revascularization.

## 2018-04-26 NOTE — Evaluation (Signed)
Occupational Therapy Evaluation Patient Details Name: Manuel Harrison MRN: [id number] DOB: Mar 29, 1971 Today's Date: 04/26/2018    History of Present Illness Pt is a 47 y.o. male with past medical history of left MCA stroke, substance abuse (cocaine), smoker, hypertension admitted with an acute right MCA stroke, found to have a right M1 occlusion.  He went for interventional thrombectomy and revascularization with stent placed. Postprocedure, MRI of brain done revealing small areas of acute/early subacute infarct involving right putamen, right mid carotid body right frontal operculum and right middle frontal gyrus white matter as well as punctate focus left posterior temporal lobe.    Clinical Impression   This 47 y/o male presents with the above. At baseline pt reports independence with ADLs, iADLs and functional mobility, was working as a Investment banker, operational. Pt currently requiring minA for functional mobility without AD; performing UB ADL at supervision level and requiring minA for LB ADLs. Pt presenting with L UE weakness (including FM deficits) and reports of blurred vision. Pt will benefit from continued acute OT services and recommend follow up therapy services in neuro outpatient OT setting to maximize pt's overall strength, safety and independence with ADLs and mobility. Will follow.     Follow Up Recommendations  Outpatient OT;Supervision/Assistance - 24 hour(outpt neuro; 24hr initially)    Equipment Recommendations  None recommended by OT(will continue to assess)           Precautions / Restrictions Precautions Precautions: Fall Restrictions Weight Bearing Restrictions: No      Mobility Bed Mobility Overal bed mobility: Needs Assistance Bed Mobility: Supine to Sit     Supine to sit: Min guard     General bed mobility comments: for safety/lines; no physical assist required  Transfers Overall transfer level: Needs assistance Equipment used: None Transfers: Sit to/from Stand Sit  to Stand: Min guard         General transfer comment: close minguard for safety and for balance upon immediate standing    Balance Overall balance assessment: Needs assistance Sitting-balance support: Feet supported;No upper extremity supported Sitting balance-Leahy Scale: Good     Standing balance support: Single extremity supported;No upper extremity supported;During functional activity Standing balance-Leahy Scale: Fair                             ADL either performed or assessed with clinical judgement   ADL Overall ADL's : Needs assistance/impaired Eating/Feeding: Supervision/ safety;Sitting   Grooming: Minimal assistance;Standing Grooming Details (indicate cue type and reason): minA standing balance Upper Body Bathing: Min guard;Sitting   Lower Body Bathing: Minimal assistance;Sit to/from stand   Upper Body Dressing : Min guard;Sitting Upper Body Dressing Details (indicate cue type and reason): donning second gown Lower Body Dressing: Minimal assistance;Sit to/from stand Lower Body Dressing Details (indicate cue type and reason): pt able to reach towards LEs and adjust socks while long sitting in bed; minA standing balance Toilet Transfer: Minimal assistance;Ambulation;Regular Toilet   Toileting- Clothing Manipulation and Hygiene: Minimal assistance;Sit to/from stand       Functional mobility during ADLs: Minimal assistance General ADL Comments: pt with reports of some dizziness during mobility, VSS overall      Vision Baseline Vision/History: Wears glasses Wears Glasses: Distance only Patient Visual Report: Blurring of vision;Diplopia(diplopia only when supine) Vision Assessment?: Yes Eye Alignment: Within Functional Limits Ocular Range of Motion: Within Functional Limits Alignment/Gaze Preference: Within Defined Limits Tracking/Visual Pursuits: Able to track stimulus in all quads without difficulty Visual  Fields: No apparent deficits Additional  Comments: pt able to read large font approx 5' away, increased difficulty reading large font slightly further away; pt able to read small font of menu without difficulty; vision does not appear to impact pt functionally this session, though will continue to assess     Perception     Praxis      Pertinent Vitals/Pain Pain Assessment: No/denies pain     Hand Dominance Right   Extremity/Trunk Assessment Upper Extremity Assessment Upper Extremity Assessment: LUE deficits/detail LUE Deficits / Details: strength grossly 4-/5, mild discoordination noted LUE Sensation: decreased light touch LUE Coordination: decreased fine motor   Lower Extremity Assessment Lower Extremity Assessment: Defer to PT evaluation       Communication Communication Communication: No difficulties   Cognition Arousal/Alertness: Awake/alert Behavior During Therapy: WFL for tasks assessed/performed Overall Cognitive Status: Impaired/Different from baseline Area of Impairment: Memory                     Memory: Decreased short-term memory         General Comments: pt A&O, able to report PLOF without difficulty; cognition overall appearing WFL during functional tasks, in speaking with SLP pt with STM deficits, will continue to assess                    Home Living Family/patient expects to be discharged to:: Private residence Living Arrangements: Spouse/significant other Available Help at Discharge: Family Type of Home: House Home Access: Ramped entrance           Bathroom Shower/Tub: Chief Strategy Officer: Standard            Lives With: Spouse    Prior Functioning/Environment Level of Independence: Independent        Comments: works as a Investment banker, operational at [employer] List: Decreased strength;Decreased range of motion;Decreased activity tolerance;Impaired balance (sitting and/or standing);Impaired vision/perception;Impaired UE functional use;Decreased  coordination      OT Treatment/Interventions: Self-care/ADL training;Therapeutic exercise;Neuromuscular education;DME and/or AE instruction;Therapeutic activities;Cognitive remediation/compensation;Visual/perceptual remediation/compensation;Patient/family education;Balance training    OT Goals(Current goals can be found in the care plan section) Acute Rehab OT Goals Patient Stated Goal: hopeful to return home soon; regain strength in L side OT Goal Formulation: With patient Time For Goal Achievement: 05/10/18 Potential to Achieve Goals: Good  OT Frequency: Min 2X/week   Barriers to D/C:            Co-evaluation PT/OT/SLP Co-Evaluation/Treatment: Yes Reason for Co-Treatment: For patient/therapist safety;To address functional/ADL transfers   OT goals addressed during session: ADL's and self-care;Strengthening/ROM      AM-PAC PT "6 Clicks" Daily Activity     Outcome Measure Help from another person eating meals?: None Help from another person taking care of personal grooming?: A Little Help from another person toileting, which includes using toliet, bedpan, or urinal?: A Little Help from another person bathing (including washing, rinsing, drying)?: A Little Help from another person to put on and taking off regular upper body clothing?: None Help from another person to put on and taking off regular lower body clothing?: A Little 6 Click Score: 20   End of Session Equipment Utilized During Treatment: Gait belt Nurse Communication: Mobility status  Activity Tolerance: Patient tolerated treatment well Patient left: in chair;with call bell/phone within reach;with chair alarm set;with family/visitor present  OT Visit Diagnosis: Unsteadiness on feet (R26.81);Other symptoms and signs involving the nervous system (R29.898)  Time: 1610-9604 OT Time Calculation (min): 24 min Charges:  OT General Charges $OT Visit: 1 Visit OT Evaluation $OT Eval Moderate Complexity: 1  Mod  Marcy Siren, OT [employer] Pager [phone] Office [phone]   Orlando Penner 04/26/2018, 1:19 PM

## 2018-04-26 NOTE — Consult Note (Signed)
NAME:  Manuel Harrison, MRN:  [id number], DOB:  November 07, 1970, LOS: 1 ADMISSION DATE:  04/25/2018, CONSULTATION DATE:  10/1 REFERRING MD:  Dr. Laurence Slate, CHIEF COMPLAINT:  AMS   Brief History   47 y/o M admitted 10/1 with acute right M1 occlusion.  To NeuroIR for revascularization s/p stenting.  Vomited with general anesthesia reversal.  Patient was left intubated and returned to ICU.    Past Medical History  HTN Tobacco Abuse  Marijuana Abuse   Significant Hospital Events   10/1  Admit with AMS, emergent R M1 occlusion   Consults: date of consult/date signed off & final recs:  PCCM 10/1   Procedures (surgical and bedside):  10/1 - Cerebral angiogram with embolism retrieval and stenting of R M1 branch with TICI # flow.  Significant Diagnostic Tests:  CTA head/neck 10/1 >> demonstrated emergent right M1 occlusion, chronic left M1 occlusion.   CT Cerebral Perfusion 10/1 >> demonstrated right frontoparietal / MCA territory brain risk without core infarct (though limited by patient motion, bilateral occluded MCA).  Micro Data:  MRSA PCR 10/1 >> negative  BCx2 10/1 >> NGTD  Antimicrobials:  Ancef (procedure) 10/1   Subjective:  Extubated last pm.  Clevidipine off since 0200. Reports residual left sided weakness and difficulty speaking. Throat still sore. No dyspnea. Residual memory loss from prior CVA 2014  Objective   Blood pressure 122/84, pulse 83, temperature 98.9 F (37.2 C), temperature source Oral, resp. rate 19, height 5\' 8"  (1.727 m), SpO2 (!) 60 %.    Currently on room air.  Intake/Output Summary (Last 24 hours) at 04/26/2018 0753 Last data filed at 04/26/2018 0400 Gross per 24 hour  Intake 2746.41 ml  Output 3775 ml  Net -1028.59 ml   There were no vitals filed for this visit.  Examination: General: appears stated age, well nourished.  HENT: No pressure ulceration, no stridor. Lungs: even/non-labored, lungs bilaterally clear  Cardiovascular: s1s2 rrr, no m/r/g    Abdomen: soft/non-distended, bsx4 active  Extremities: warm/dry, no edema  Neuro: awake and alert. Speech fluent but mildly dysarthric. No facial asymmetry. 4/5 left sided weakness. No sensory deficits.  Resolved Hospital Problem list   Respiratory failure  Assessment & Plan:   Acute Right M1 Occlusion  CVA  s/p revascularization per Neuro IR with stenting, ASA, Brilinta  Mild residual deficits. Start stroke rehabilitation. Initiate secondary prevention - start Crestor.  Acute Respiratory Insufficiency - intubated for procedure and now resolved.  reported grade III airway, intubated with Hyacinth Meeker 2 x2 attempts - Should be flagged as a difficult airway  Poorly controlled hypertension, requiring titration of Clevidipine to prevent post procedural hemorrhage. BP now at goal of <140 without IV infusion.  Now on home amlodipine only.  Tobacco Abuse,  Past THC, Cocaine Abuse Current UDS negative. Still smoking and patient counseled on cessation. Interested in trying Chantix to help quit.  Unexplained hyperkalemia - will repeat.   Disposition / Summary of Today's Plan 04/26/18    No further critical care issues. PCCM will sign off. Please reconsult if further questions.    Labs   CBC: Recent Labs  Lab 04/25/18 0422 04/25/18 0433 04/26/18 0543  WBC 6.7  --  5.8  NEUTROABS 3.2  --  3.6  HGB 15.2 14.6 12.5*  HCT 43.7 43.0 34.6*  MCV 91.6  --  91.3  PLT 305  --  317    Basic Metabolic Panel:  Hyperkalemia likely factitious  Recent Labs  Lab 04/25/18 0422 04/25/18 0433  04/26/18 0543  NA 137 141 135  K 4.0 3.7 5.7*  CL 108 105 111  CO2 22  --  21*  GLUCOSE 136* 134* 96  BUN 9 8 <5*  CREATININE 1.04 0.90 0.78  CALCIUM 8.7*  --  7.4*   GFR: CrCl cannot be calculated (Unknown ideal weight.). Recent Labs  Lab 04/25/18 0422 04/26/18 0543  WBC 6.7 5.8    Liver Function Tests: Recent Labs  Lab 04/25/18 0422  AST 25  ALT 12  ALKPHOS 76  BILITOT 0.8  PROT  6.3*  ALBUMIN 4.0   No results for input(s): LIPASE, AMYLASE in the last 168 hours. No results for input(s): AMMONIA in the last 168 hours.  ABG    Component Value Date/Time   PHART 7.351 04/25/2018 1035   PCO2ART 43.5 04/25/2018 1035   PO2ART 484.0 (H) 04/25/2018 1035   HCO3 24.2 04/25/2018 1035   TCO2 26 04/25/2018 1035   ACIDBASEDEF 2.0 04/25/2018 1035   O2SAT 100.0 04/25/2018 1035     Coagulation Profile: Recent Labs  Lab 04/25/18 0422  INR 0.90    Cardiac Enzymes: No results for input(s): CKTOTAL, CKMB, CKMBINDEX, TROPONINI in the last 168 hours.  HbA1C:  At target. Hgb A1c MFr Bld  Date/Time Value Ref Range Status  04/25/2018 10:20 AM 5.6 4.8 - 5.6 % Final    Comment:    (NOTE)         Prediabetes: 5.7 - 6.4         Diabetes: >6.4         Glycemic control for adults with diabetes: <7.0   10/06/2012 05:04 AM 5.5 <5.7 % Final    Comment:    (NOTE)                                                                       According to the ADA Clinical Practice Recommendations for 2011, when HbA1c is used as a screening test:  >=6.5%   Diagnostic of Diabetes Mellitus           (if abnormal result is confirmed) 5.7-6.4%   Increased risk of developing Diabetes Mellitus References:Diagnosis and Classification of Diabetes Mellitus,Diabetes Care,2011,34(Suppl 1):S62-S69 and Standards of Medical Care in         Diabetes - 2011,Diabetes Care,2011,34 (Suppl 1):S11-S61.    CBG: well controlled. Recent Labs  Lab 04/25/18 1148  GLUCAP 97   Lipid Panel Low HDL with hypertriglyceridemia.    Component Value Date/Time   CHOL 139 04/25/2018 1020   TRIG 790 (H) 04/25/2018 1020   HDL 33 (L) 04/25/2018 1020   CHOLHDL 4.2 04/25/2018 1020   VLDL UNABLE TO CALCULATE IF TRIGLYCERIDE OVER 400 mg/dL 16/04/9603 5409   LDLCALC UNABLE TO CALCULATE IF TRIGLYCERIDE OVER 400 mg/dL 81/19/1478 2956   Lynnell Catalan, MD [facility] ICU Physician [facility]  Pager:  [phone] Mobile: [phone] After hours: [phone].  04/26/2018, 7:53 AM

## 2018-04-27 ENCOUNTER — Encounter (HOSPITAL_COMMUNITY): Payer: Self-pay | Admitting: Interventional Radiology

## 2018-04-27 LAB — BASIC METABOLIC PANEL
Anion gap: 6 (ref 5–15)
BUN: 5 mg/dL — ABNORMAL LOW (ref 6–20)
CO2: 23 mmol/L (ref 22–32)
Calcium: 8.1 mg/dL — ABNORMAL LOW (ref 8.9–10.3)
Chloride: 111 mmol/L (ref 98–111)
Creatinine, Ser: 0.8 mg/dL (ref 0.61–1.24)
GFR calc Af Amer: >60 mL/min (ref >60)
GFR calc non Af Amer: >60 mL/min (ref >60)
Glucose, Bld: 99 mg/dL (ref 70–99)
Potassium: 3.5 mmol/L (ref 3.5–5.1)
Sodium: 140 mmol/L (ref 135–145)

## 2018-04-27 LAB — PLATELET INHIBITION P2Y12: Platelet Function  P2Y12: 80 [PRU] — ABNORMAL LOW (ref 194–418)

## 2018-04-27 MED ORDER — SODIUM CHLORIDE 0.9 % IV SOLN
INTRAVENOUS | Status: DC
  Administered 2018-04-28: 01:00:00 via INTRAVENOUS

## 2018-04-27 NOTE — Progress Notes (Signed)
Physical Therapy Treatment Patient Details Name: Manuel Harrison MRN: [id number] DOB: 10/17/1970 Today's Date: 04/27/2018    History of Present Illness Pt is a 47 y.o. male with past medical history of left MCA stroke, substance abuse (cocaine), smoker, hypertension admitted with an acute right MCA stroke, found to have a right M1 occlusion.  He went for interventional thrombectomy and revascularization with stent placed. Postprocedure, MRI of brain done revealing small areas of acute/early subacute infarct involving right putamen, right mid carotid body right frontal operculum and right middle frontal gyrus white matter as well as punctate focus left posterior temporal lobe.     PT Comments    Patient seen for mobility progression. Pt with mild gait deviations however no overt LOB or physical assistance needed. Pt with DGI score of 18/24 indicative of risk for falls. Pt will continue to benefit from further skilled PT services in acute setting. Continue to progress as tolerated.     Follow Up Recommendations  No PT follow up;Supervision - Intermittent     Equipment Recommendations  None recommended by PT    Recommendations for Other Services       Precautions / Restrictions Precautions Precautions: Fall    Mobility  Bed Mobility Overal bed mobility: Modified Independent Bed Mobility: Supine to Sit           General bed mobility comments: increased time and effort  Transfers Overall transfer level: Needs assistance Equipment used: None Transfers: Sit to/from Stand Sit to Stand: Supervision         General transfer comment: for safety  Ambulation/Gait Ambulation/Gait assistance: Min guard Gait Distance (Feet): 300 Feet Assistive device: None Gait Pattern/deviations: Step-through pattern;Decreased stride length;Drifts right/left     General Gait Details: no ataxia noted but pt with shakiness/tremors; pt with decreased cadence and slight drifting R and L at times  however no LOB; cues for increased cadence which pt is able to demonstrate   Stairs             Wheelchair Mobility    Modified Rankin (Stroke Patients Only) Modified Rankin (Stroke Patients Only) Pre-Morbid Rankin Score: No significant disability Modified Rankin: Moderately severe disability     Balance Overall balance assessment: Needs assistance Sitting-balance support: Feet supported;No upper extremity supported Sitting balance-Leahy Scale: Good     Standing balance support: No upper extremity supported Standing balance-Leahy Scale: Fair                   Standardized Balance Assessment Standardized Balance Assessment : Dynamic Gait Index   Dynamic Gait Index Level Surface: Mild Impairment Change in Gait Speed: Normal Gait with Horizontal Head Turns: Mild Impairment Gait with Vertical Head Turns: Mild Impairment Gait and Pivot Turn: Mild Impairment Step Over Obstacle: Mild Impairment Step Around Obstacles: Mild Impairment Steps: Normal Total Score: 18      Cognition Arousal/Alertness: Awake/alert Behavior During Therapy: WFL for tasks assessed/performed Overall Cognitive Status: Within Functional Limits for tasks assessed                                        Exercises      General Comments        Pertinent Vitals/Pain Pain Assessment: No/denies pain    Home Living                      Prior Function  PT Goals (current goals can now be found in the care plan section) Progress towards PT goals: Progressing toward goals    Frequency    Min 4X/week      PT Plan Current plan remains appropriate    Co-evaluation              AM-PAC PT "6 Clicks" Daily Activity  Outcome Measure  Difficulty turning over in bed (including adjusting bedclothes, sheets and blankets)?: None Difficulty moving from lying on back to sitting on the side of the bed? : A Little Difficulty sitting down on and  standing up from a chair with arms (e.g., wheelchair, bedside commode, etc,.)?: Unable Help needed moving to and from a bed to chair (including a wheelchair)?: None Help needed walking in hospital room?: A Little Help needed climbing 3-5 steps with a railing? : A Little 6 Click Score: 18    End of Session Equipment Utilized During Treatment: Gait belt Activity Tolerance: Patient tolerated treatment well Patient left: in chair;with call bell/phone within reach;with family/visitor present Nurse Communication: Mobility status PT Visit Diagnosis: Unsteadiness on feet (R26.81);Hemiplegia and hemiparesis Hemiplegia - Right/Left: Left Hemiplegia - dominant/non-dominant: Non-dominant Hemiplegia - caused by: Cerebral infarction     Time: 0942-1000 PT Time Calculation (min) (ACUTE ONLY): 18 min  Charges:  $Gait Training: 8-22 mins                     Erline Levine, PTA Acute Rehabilitation Services Pager: [phone] Office: [phone]     Carolynne Edouard 04/27/2018, 12:05 PM

## 2018-04-27 NOTE — Progress Notes (Signed)
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 Corliss Skains.  Subjective:  47 y/o M with past medical history significant for HTN, remote substance abuse history (negative UDS this admission) who presented to [facility] ED on 04/25/18 via EMS for left sided facial droop and neglect as well as left hemiparesis. CT head showed, "mildly dense right MCA concerning for thromboembolism," CTA showed, "emergent right M1 occlusion with intermediate collaterals and chronic left M1 occlusion with intermediate collaterals."  Patient was emergently taken to IR where right common carotid arteriogram with revascularization of right MCA M1 which was performed by Dr. Corliss Skains without complication.  Patient reports he feels ok today, has some mild intermittent headaches that resolve spontaneously or with medication - he is concerned the stent is causing headaches. He endorses blurry vision in both eyes however he does normally wear glasses which he has not used since admission - he denies vision loss or pain.   Allergies: Aleve [naproxen sodium]  Medications: Prior to Admission medications   Medication Sig Start Date End Date Taking? Authorizing Provider  atorvastatin (LIPITOR) 10 MG tablet Take 1 tablet (10 mg total) by mouth daily at 6 PM. Patient not taking: Reported on 09/29/2016 11/24/15   Ruben Im, MD  diclofenac sodium (VOLTAREN) 1 % GEL Apply 4 g topically 4 (four) times daily. Patient not taking: Reported on 09/29/2016 11/24/15   Ruben Im, MD  doxycycline (VIBRAMYCIN) 100 MG capsule Take 1 capsule (100 mg total) by mouth 2 (two) times daily. One po bid x 7 days Patient not taking: Reported on 09/29/2016 02/01/16   Street, [place], PA-C  hydrochlorothiazide (MICROZIDE) 12.5 MG capsule Take 1 capsule (12.5 mg  total) by mouth daily. Patient not taking: Reported on 09/29/2016 11/24/15 11/23/16  Ruben Im, MD  HYDROcodone-acetaminophen [facility]) 5-325 MG tablet Take 1-2 tablets by mouth every 6 (six) hours as needed for severe pain. Patient not taking: Reported on 09/29/2016 02/01/16   Street, [place], PA-C  loratadine (CLARITIN) 10 MG tablet Take 1 tablet (10 mg total) by mouth daily. Patient not taking: Reported on 09/29/2016 11/24/15 11/23/16  Ruben Im, MD  naproxen (NAPROSYN) 500 MG tablet Take 1 tablet (500 mg total) by mouth 2 (two) times daily as needed for mild pain, moderate pain or headache (TAKE WITH MEALS.). Patient not taking: Reported on 09/29/2016 02/01/16   Street, [place], PA-C  ondansetron (ZOFRAN) 4 MG tablet Take 1 tablet (4 mg total) by mouth every 6 (six) hours as needed for nausea or vomiting. Patient not taking: Reported on 04/25/2018 09/29/16   Lavera Guise, MD     Vital Signs: BP (!) 150/89 (BP Location: Left Arm)   Pulse 60   Temp 98.2 F (36.8 C) (Oral)   Resp 16   Ht 5\' 8"  (1.727 m)   SpO2 100%   BMI 24.33 kg/m   Physical Exam  Constitutional: No distress.  Cardiovascular: Normal rate, regular rhythm and normal heart sounds.  Pulmonary/Chest: Effort normal and breath sounds normal.  Abdominal: Soft. There is no tenderness.  Neurological:  Alert, awake, and oriented x3. Speech and comprehension intact. PERRL bilaterally. EOMs intact bilaterally without nystagmus or subjective diplopia. Visual fields not assessed. No facial asymmetry. Tongue midline. Spontaneously moving all extremities. No pronator drift. Fine motor and coordination intact and symmetric. Gait not assessed. Romberg not assessed. Heel  to toe not assessed. Distal pulses 2+ bilaterally.   Skin: Skin is warm and dry. He is not diaphoretic.  Right groin incision site clean, dry, intact without discharge, bleeding or hematoma.  Nursing note and vitals reviewed.   Imaging: Ct Angio Head W Or Wo  Contrast  Result Date: 04/25/2018 CLINICAL DATA:  LEFT-sided weakness, slurred speech. Last seen normal at 2330 hours. Follow up code stroke. EXAM: CT ANGIOGRAPHY HEAD AND NECK TECHNIQUE: Multidetector CT imaging of the head and neck was performed using the standard protocol during bolus administration of intravenous contrast. Multiplanar CT image reconstructions and MIPs were obtained to evaluate the vascular anatomy. Carotid stenosis measurements (when applicable) are obtained utilizing NASCET criteria, using the distal internal carotid diameter as the denominator. CONTRAST:  50mL ISOVUE-370 IOPAMIDOL (ISOVUE-370) INJECTION 76% COMPARISON:  MRA head October 06, 2012 and CT HEAD April 25, 2018. FINDINGS: CTA NECK FINDINGS: AORTIC ARCH: Normal appearance of the thoracic arch, 2 vessel arch is a normal variant. The origins of the innominate, left Common carotid artery and subclavian artery are widely patent. RIGHT CAROTID SYSTEM: Common carotid artery is patent, extrinsic compression by laryngeal cartilage. Normal appearance of the carotid bifurcation without hemodynamically significant stenosis by NASCET criteria. Mild luminal irregularity proximal internal carotid artery most compatible with atherosclerosis. Tonsillar loop. LEFT CAROTID SYSTEM: Common carotid artery is patent. Normal appearance of the carotid bifurcation without hemodynamically significant stenosis by NASCET criteria. Normal appearance of the internal carotid artery. VERTEBRAL ARTERIES:RIGHT vertebral artery is dominant. Normal appearance of the vertebral arteries, widely patent. SKELETON: No acute osseous process though bone windows have not been submitted. Poor dentition. Moderate cervical spondylosis. OTHER NECK: Soft tissues of the neck are nonacute though, not tailored for evaluation. UPPER CHEST: Included lung apices are clear. No superior mediastinal lymphadenopathy. CTA HEAD FINDINGS: ANTERIOR CIRCULATION: Patent cervical internal carotid  arteries, petrous, cavernous and supra clinoid internal carotid arteries. Patent anterior communicating artery. Occluded distal RIGHT M1 segment with intermediate collateralization. Chronically occluded LEFT MCA with Intermedia collaterals. No large vessel occlusion, significant stenosis, contrast extravasation or aneurysm. POSTERIOR CIRCULATION: Patent vertebral arteries, vertebrobasilar junction and basilar artery, as well as main branch vessels. Patent posterior cerebral arteries. Robust bilateral posterior communicating arteries present. No large vessel occlusion, significant stenosis, contrast extravasation or aneurysm. VENOUS SINUSES: Major dural venous sinuses are patent though not tailored for evaluation on this angiographic examination. ANATOMIC VARIANTS: None. DELAYED PHASE: Not performed. MIP images reviewed. IMPRESSION: CTA NECK: 1. No hemodynamically significant stenosis ICA's. 2. Patent vertebral arteries. CTA HEAD: 1. Emergent RIGHT M1 occlusion with intermediate collaterals. 2. 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 Critical Value/emergent results text paged to Dr.SUSHANTH AROOR via AM[employer] secure system on 04/25/2018 at 4:40 am, including interpreting physician's phone number. Electronically Signed   By: Awilda Metro M.D.   On: 04/25/2018 04:48   Ct Angio Neck W Or Wo Contrast  Result Date: 04/25/2018 CLINICAL DATA:  LEFT-sided weakness, slurred speech. Last seen normal at 2330 hours. Follow up code stroke. EXAM: CT ANGIOGRAPHY HEAD AND NECK TECHNIQUE: Multidetector CT imaging of the head and neck was performed using the standard protocol during bolus administration of intravenous contrast. Multiplanar CT image reconstructions and MIPs were obtained to evaluate the vascular anatomy. Carotid stenosis measurements (when applicable) are obtained utilizing NASCET criteria, using the distal internal carotid diameter as the denominator. CONTRAST:  50mL ISOVUE-370 IOPAMIDOL  (ISOVUE-370) INJECTION 76% COMPARISON:  MRA head October 06, 2012 and CT HEAD April 25, 2018. FINDINGS: CTA NECK  FINDINGS: AORTIC ARCH: Normal appearance of the thoracic arch, 2 vessel arch is a normal variant. The origins of the innominate, left Common carotid artery and subclavian artery are widely patent. RIGHT CAROTID SYSTEM: Common carotid artery is patent, extrinsic compression by laryngeal cartilage. Normal appearance of the carotid bifurcation without hemodynamically significant stenosis by NASCET criteria. Mild luminal irregularity proximal internal carotid artery most compatible with atherosclerosis. Tonsillar loop. LEFT CAROTID SYSTEM: Common carotid artery is patent. Normal appearance of the carotid bifurcation without hemodynamically significant stenosis by NASCET criteria. Normal appearance of the internal carotid artery. VERTEBRAL ARTERIES:RIGHT vertebral artery is dominant. Normal appearance of the vertebral arteries, widely patent. SKELETON: No acute osseous process though bone windows have not been submitted. Poor dentition. Moderate cervical spondylosis. OTHER NECK: Soft tissues of the neck are nonacute though, not tailored for evaluation. UPPER CHEST: Included lung apices are clear. No superior mediastinal lymphadenopathy. CTA HEAD FINDINGS: ANTERIOR CIRCULATION: Patent cervical internal carotid arteries, petrous, cavernous and supra clinoid internal carotid arteries. Patent anterior communicating artery. Occluded distal RIGHT M1 segment with intermediate collateralization. Chronically occluded LEFT MCA with Intermedia collaterals. No large vessel occlusion, significant stenosis, contrast extravasation or aneurysm. POSTERIOR CIRCULATION: Patent vertebral arteries, vertebrobasilar junction and basilar artery, as well as main branch vessels. Patent posterior cerebral arteries. Robust bilateral posterior communicating arteries present. No large vessel occlusion, significant stenosis, contrast  extravasation or aneurysm. VENOUS SINUSES: Major dural venous sinuses are patent though not tailored for evaluation on this angiographic examination. ANATOMIC VARIANTS: None. DELAYED PHASE: Not performed. MIP images reviewed. IMPRESSION: CTA NECK: 1. No hemodynamically significant stenosis ICA's. 2. Patent vertebral arteries. CTA HEAD: 1. Emergent RIGHT M1 occlusion with intermediate collaterals. 2. 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 Critical Value/emergent results text paged to Dr.SUSHANTH AROOR via AMION secure system on 04/25/2018 at 4:40 am, including interpreting physician's phone number. Electronically Signed   By: Awilda Metro M.D.   On: 04/25/2018 04:48   Mr Brain Wo Contrast  Result Date: 04/26/2018 CLINICAL DATA:  47 y/o M; stroke post mechanical thrombectomy of right MCA M1 occlusion with revascularization. EXAM: MRI HEAD WITHOUT CONTRAST TECHNIQUE: Multiplanar, multiecho pulse sequences of the brain and surrounding structures were obtained without intravenous contrast. COMPARISON:  04/25/2018 cerebral angiogram. 04/25/2018 CT head, CTA head, CT perfusion. FINDINGS: Brain: Reduced diffusion is present within the right putamen and mid caudate body as well as approximately 10 mm foci within the right frontal operculum and right middle frontal gyrus centrum semiovale compatible with acute/early subacute infarction. Additionally, there is a punctate focus of infarction within the left posterior temporal periventricular white matter. No associated hemorrhage or mass effect. Chronic lacunar infarction involving the left putamen and corona radiata. Mild chronic microvascular ischemic changes of white matter and volume loss of the brain. No extra-axial collection, hydrocephalus, effacement of basilar cisterns, or herniation. Susceptibility weighted sequences are motion degraded, no gross intracranial hemorrhage. Vascular: Normal flow voids. Skull and upper cervical spine: Normal  marrow signal. Sinuses/Orbits: Mild diffuse paranasal sinus mucosal thickening. No abnormal signal of mastoid air cells. Orbits are unremarkable. Other: None. IMPRESSION: 1. Small areas of acute/early subacute infarction involving the right putamen, right mid caudate body, right frontal operculum, and the right middle frontal gyrus white matter. Additional punctate focus of acute/early subacute infarction within the left posterior temporal periventricular white matter. No associated hemorrhage or mass effect. 2. Chronic lacunar infarct of the left basal ganglia. 3. Mild chronic microvascular ischemic changes and volume loss of the brain. 4. Mild paranasal  sinus mucosal thickening. Electronically Signed   By: Mitzi Hansen M.D.   On: 04/26/2018 01:24   Ct Cerebral Perfusion W Contrast  Result Date: 04/25/2018 CLINICAL DATA:  Follow up RIGHT M1 occlusion. LEFT-sided deficits, slurred speech. EXAM: CT PERFUSION BRAIN TECHNIQUE: Multiphase CT imaging of the brain was performed following IV bolus contrast injection. Subsequent parametric perfusion maps were calculated using RAPID software. CONTRAST:  40mL ISOVUE-370 IOPAMIDOL (ISOVUE-370) INJECTION 76% COMPARISON:  CT angiogram head April 25, 2018 at 0435 hours. FINDINGS: Moderately motion degraded examination. CBF (<30%) Volume: 0mL Perfusion (Tmax>6.0s) volume: 52mL Mismatch Volume: 52mL ASPECTS on noncontrast CT Head: 9 at 0426 hours today. Infarct Core: 0 mL Infarction Location:RIGHT frontal parietal lobes; limited is assessment due to ipsilateral RIGHT MCA sampling, with chronic LEFT M1 occlusion. IMPRESSION: 1. RIGHT frontoparietal/MCA territory brain at risk without core infarct though, limited by patient motion and bilateral occluded MCA. Electronically Signed   By: Awilda Metro M.D.   On: 04/25/2018 05:12   Dg Chest Port 1 View  Result Date: 04/26/2018 CLINICAL DATA:  Acute respiratory failure, hypoxia, history of stroke EXAM: PORTABLE  CHEST 1 VIEW COMPARISON:  Portable chest x-ray of 04/25/2018 and two-view chest x-ray of 10/06/2012 FINDINGS: The endotracheal tube has been removed as is the NG tube. The lungs appear well aerated. Mediastinal and hilar contours are unremarkable and heart size is stable. No bony abnormality is seen. IMPRESSION: 1. Removal of endotracheal tube and NG tube. 2. Improved aeration.  No active lung disease. Electronically Signed   By: Dwyane Dee M.D.   On: 04/26/2018 08:26   Dg Chest Port 1 View  Result Date: 04/25/2018 CLINICAL DATA:  Intubation, endotracheal tube position EXAM: PORTABLE CHEST 1 VIEW COMPARISON:  Portable exam 0956 hours compared to 10/06/2012 FINDINGS: Tip of endotracheal tube projects 2.8 cm above carina. Nasogastric tube extends into stomach. Normal heart size, mediastinal contours, and pulmonary vascularity. Minimal peribronchial thickening. No acute infiltrate, pleural effusion or pneumothorax. Bones unremarkable. IMPRESSION: No acute abnormalities. Electronically Signed   By: Ulyses Southward M.D.   On: 04/25/2018 10:21   Dg Abd Portable 1v  Result Date: 04/25/2018 CLINICAL DATA:  OG tube position EXAM: PORTABLE ABDOMEN - 1 VIEW COMPARISON:  None. FINDINGS: OG tube tip is in the proximal to mid stomach. Nonobstructive bowel gas pattern with moderate stool in the colon. IMPRESSION: OG tube tip in the stomach. Electronically Signed   By: Charlett Nose M.D.   On: 04/25/2018 10:16   Ct Head Code Stroke Wo Contrast  Result Date: 04/25/2018 CLINICAL DATA:  Code stroke. LEFT-sided weakness, slurred speech. History of stroke and hypertension. EXAM: CT HEAD WITHOUT CONTRAST TECHNIQUE: Contiguous axial images were obtained from the base of the skull through the vertex without intravenous contrast. COMPARISON:  MRI head October 06, 2012 FINDINGS: BRAIN: No intraparenchymal hemorrhage, mass effect nor midline shift. Old LEFT basal ganglia infarct with ex vacuo dilatation LEFT lateral ventricle. Focal  blurring RIGHT frontal gray-white matter junction. No abnormal extra-axial fluid collections. Basal cisterns are patent. VASCULAR: Mildly dense RIGHT MCA versus artifact. SKULL/SOFT TISSUES: No skull fracture. No significant soft tissue swelling. ORBITS/SINUSES: The included ocular globes and orbital contents are normal.Trace paranasal sinus mucosal thickening. Mastoid air cells are well aerated. OTHER: None. ASPECTS [facility] Stroke Program Early CT Score) - Ganglionic level infarction (caudate, lentiform nuclei, internal capsule, insula, M1-M3 cortex): 6 - Supraganglionic infarction (M4-M6 cortex): 3 Total score (0-10 with 10 being normal): 9 IMPRESSION: 1. Mildly dense RIGHT MCA concerning  for thromboembolism. 2. Small age-indeterminate RIGHT frontal infarct, new from 2014. 3. Old LEFT basal ganglia infarct. 4. ASPECTS is 9. 5. Critical Value/emergent results text paged to Dr.AROOR, Neurology via AMION secure system on 04/25/2018 at 4:36 am, including interpreting physician's phone number. Electronically Signed   By: Awilda Metro M.D.   On: 04/25/2018 04:37    Labs:  CBC: Recent Labs    04/25/18 0422 04/25/18 0433 04/26/18 0543  WBC 6.7  --  5.8  HGB 15.2 14.6 12.5*  HCT 43.7 43.0 34.6*  PLT 305  --  317    COAGS: Recent Labs    04/25/18 0422  INR 0.90  APTT 35    BMP: Recent Labs    04/25/18 0422 04/25/18 0433 04/26/18 0543 04/27/18 0512  NA 137 141 135 140  K 4.0 3.7 5.7* 3.5  CL 108 105 111 111  CO2 22  --  21* 23  GLUCOSE 136* 134* 96 99  BUN 9 8 <5* 5*  CALCIUM 8.7*  --  7.4* 8.1*  CREATININE 1.04 0.90 0.78 0.80  GFRNONAA >60  --  >60 >60  GFRAA >60  --  >60 >60    LIVER FUNCTION TESTS: Recent Labs    04/25/18 0422  BILITOT 0.8  AST 25  ALT 12  ALKPHOS 76  PROT 6.3*  ALBUMIN 4.0    Assessment and Plan:  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 Corliss Skains. Patient's condition stable, no change  from yesterday continues to spontaneously move all extremities, c/o mild intermittent headache resolved with medication. Right groin incision still stable.  Continue taking Brilinta 90 mg twice daily and Aspirin 81 mg once daily. . Plan to follow-up with Dr. Corliss Skains in clinic 4 weeks after discharge.  P2Y12 ordered but pending at time of note writing; H/H with slight decrease 12.5/34.6 today.   Appreciate and agree with neurology management.  Please call IR with questions/concerns.  Electronically Signed: Villa Herb, PA-C 04/27/2018, 11:29 AM   I spent a total of 15 Minutes at the the patient's bedside AND on the patient's hospital floor or unit, greater than 50% of which was counseling/coordinating care for right MCA M1 occlusion s/p revascularization.

## 2018-04-27 NOTE — Progress Notes (Signed)
STROKE TEAM PROGRESS NOTE       SUBJECTIVE (INTERVAL HISTORY) His girlfriend s at the bedside. Patient  Doing well. He is scheduled for TEE and loop recorder.. Telemetry monitoring does not show any arrhythmias. Therapy has no recommendations he can go home after his tests   OBJECTIVE Vitals:   04/27/18 0347 04/27/18 0833 04/27/18 1126 04/27/18 1558  BP: 138/77 136/89 (!) 150/89 (!) 144/79  Pulse: 75 69 60 68  Resp: 18 16 16 16   Temp: 98.5 F (36.9 C) 98.2 F (36.8 C) 98.2 F (36.8 C) 98.4 F (36.9 C)  TempSrc: Oral Oral Oral Oral  SpO2: 97% 100% 100% 98%  Height:        CBC:  Recent Labs  Lab 04/25/18 0422 04/25/18 0433 04/26/18 0543  WBC 6.7  --  5.8  NEUTROABS 3.2  --  3.6  HGB 15.2 14.6 12.5*  HCT 43.7 43.0 34.6*  MCV 91.6  --  91.3  PLT 305  --  317    Basic Metabolic Panel:  Recent Labs  Lab 04/26/18 0543 04/27/18 0512  NA 135 140  K 5.7* 3.5  CL 111 111  CO2 21* 23  GLUCOSE 96 99  BUN <5* 5*  CREATININE 0.78 0.80  CALCIUM 7.4* 8.1*    Lipid Panel:     Component Value Date/Time   CHOL 139 04/25/2018 1020   TRIG 790 (H) 04/25/2018 1020   HDL 33 (L) 04/25/2018 1020   CHOLHDL 4.2 04/25/2018 1020   VLDL UNABLE TO CALCULATE IF TRIGLYCERIDE OVER 400 mg/dL 16/04/9603 5409   LDLCALC UNABLE TO CALCULATE IF TRIGLYCERIDE OVER 400 mg/dL 81/19/1478 2956   OZHY8M:  Lab Results  Component Value Date   HGBA1C 5.6 04/25/2018   Urine Drug Screen:     Component Value Date/Time   LABOPIA NONE DETECTED 04/25/2018 1005   COCAINSCRNUR NONE DETECTED 04/25/2018 1005   LABBENZ NONE DETECTED 04/25/2018 1005   AMPHETMU NONE DETECTED 04/25/2018 1005   THCU NONE DETECTED 04/25/2018 1005   LABBARB NONE DETECTED 04/25/2018 1005    Alcohol Level     Component Value Date/Time   ETH <10 04/25/2018 0422    IMAGING  MR Brain WO Contrast 04/26/2018 IMPRESSION: 1. Small areas of acute/early subacute infarction involving the right putamen, right mid caudate  body, right frontal operculum, and the right middle frontal gyrus white matter. Additional punctate focus of acute/early subacute infarction within the left posterior temporal periventricular white matter. No associated hemorrhage or mass effect. 2. Chronic lacunar infarct of the left basal ganglia. 3. Mild chronic microvascular ischemic changes and volume loss of the brain. 4. Mild paranasal sinus mucosal thickening.   Ct Angio Head W Or Wo Contrast Ct Angio Neck W Or Wo Contrast 04/25/2018 IMPRESSION:   CTA NECK:  1. No hemodynamically significant stenosis ICA's.  2. Patent vertebral arteries.   CTA HEAD:  1. Emergent RIGHT M1 occlusion with intermediate collaterals.  2. 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    Ct Cerebral Perfusion W Contrast 04/25/2018 IMPRESSION:  RIGHT frontoparietal/MCA territory brain at risk without core infarct though, limited by patient motion and bilateral occluded MCA.    Ct Head Code Stroke Wo Contrast 04/25/2018 IMPRESSION:  1. Mildly dense RIGHT MCA concerning for thromboembolism.  2. Small age-indeterminate RIGHT frontal infarct, new from 2014.  3. Old LEFT basal ganglia infarct.  4. ASPECTS is 9. 5.    DG Chest Portable 04/26/2018 IMPRESSION: 1. Removal of endotracheal tube and NG tube.  2. Improved aeration.  No active lung disease.    S/P RT common carotid arteriogram - Dr Corliss Skains RT CFA approach. Findings. Occluded RT MCA M 1 seg followed by complete revascularization of RT MCA M1  With x 1 pass with embotrap 5mm x 33 mm retriever device achieving a TICI 3 revascularization . Underlying stenosis worsening despite IA nitroglycerin And superselective IA 5 mg of verapamil to complete re occlusion  reqiuring rescue stent with recovery of TICI 3 revascularization.    Transthoracic Echocardiogram 04/25/2018 Study Conclusions - Left ventricle: The cavity size was normal. Wall thickness was   increased in a pattern of  moderate LVH. Systolic function was   normal. The estimated ejection fraction was in the range of 60%   to 65%. Wall motion was normal; there were no regional wall   motion abnormalities. Features are consistent with a pseudonormal   left ventricular filling pattern, with concomitant abnormal   relaxation and increased filling pressure (grade 2 diastolic   dysfunction).     PHYSICAL EXAM Blood pressure (!) 144/79, pulse 68, temperature 98.4 F (36.9 C), temperature source Oral, resp. rate 16, height 5\' 8"  (1.727 m), SpO2 98 %. Middle-age [place] gentleman who is not in distress. . Afebrile. Head is nontraumatic. Neck is supple without bruit.    Cardiac exam no murmur or gallop. Lungs are clear to auscultation. Distal pulses are well felt.  Neurological Exam  Awake alert oriented 3 with normal speech and language function. Slight right gaze preference but able to look all the way to the left.  upils equal reactive. Fundi not visualized. Mild left lower facial weakness. Tongue midline. Mild left hemiplegia with left upper extremity 4 +/5 strength.diminished fine finger movements on the left. Orbits right over left upper extremity. Sensation appears preserved. Deep tendon reflexes are symmetric. Plantars are downgoing. Gait not tested.      ASSESSMENT/PLAN Mr. CROY DRUMWRIGHT is a 47 y.o. male with history of prior stroke, htn, tobacco use and substance abuse presenting with acute onset left-sided plegia, gaze deviation.[name] He did not receive IV t-PA due to late presentation. S/P Thrombectomy / stent Rt M1 occlusion in IR with TICI 3 revascularization.  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   MRI head - Small areas of acute/early subacute infarction involving the right putamen, right mid caudate body, right frontal operculum, and the right middle  frontal gyrus white matter. Additional punctate focus of acute/early subacute infarction within the left posterior temporal periventricular white matter  MRA head - not performed  CTA H&N - .Emergent Rt M1 occlusion with intermediate collaterals. Chronic Lt M1 occlusion with intermediate collaterals.   Carotid Doppler - CTA neck performed - carotid dopplers not indicated.  UDS - negative  HIV antibody - non reactive  2D Echo  - EF 60 - 65%. No cardiac source of emboli identified.   LDL - unable to calculate due to elevated triglycerides  HgbA1c - 5.6  VTE prophylaxis - SCDs  Diet - NPO  No antithrombotic prior to admission, now on No antithrombotic  Patient counseled to be compliant with his antithrombotic medications  Ongoing aggressive stroke risk factor management  Therapy recommendations: none Disposition:  Home Hypertension  Stable . BP parameters initially per Dr Corliss Skains . Long-term BP goal normotensive  Hyperlipidemia  Lipid lowering medication PTA:  Lipitor 10 mg  daily  LDL pending, goal < 70  Current lipid lowering medication: none - NPO  Continue statin at discharge   Other Stroke Risk Factors  Cigarette smoker - advised to stop smoking  ETOH use, advised to drink no more than 1 alcoholic beverage per day.  Obesity, Body mass index is 24.33 kg/m., recommend weight loss, diet and exercise as appropriate   Hx stroke/TIA  Substance abuse history   Other Active Problems  Potassium 5.7 -> recheck in AM  TEE and Loop requested for tomorrow.      Hospital day # 2 I have personally examined this patient, reviewed notes, independently viewed imaging studies, participated in medical decision making and plan of care.ROS completed by me personally and pertinent positives fully documented  I have made any additions or clarifications directly to the above note.he presented with left hemiplegia due to right middle cerebral artery occlusion and  presented beyond time window for IV thrombolysis but underwent successful mechanical thrombectomy but due to recurrent right MCA occlusion required acute angioplasty and stenting. He seems to be doing well clinically and plan to transfer to the floor bed today. Mobilize out of bed. Therapy and rehabilitation consult.. Continue aspirin and Brilinta due to his intracranial stent and aggressive risk factor modification. Long discussion with family available at the bedside  Patient is interested in participating in the [place] trial for stroke prevention. He will be given information to review and decide..greater than 50% time during this 25 minute visit was spent on counseling and coordination of care about his stroke and discussion with care team and answering questions Delia Heady, MD Medical Director [facility] Pager: [phone] 04/27/2018 6:09 PM       To contact Stroke Continuity provider, please refer to [email or web address]. After hours, contact General Neurology

## 2018-04-27 NOTE — Progress Notes (Signed)
Occupational Therapy Treatment Patient Details Name: Manuel Harrison MRN: [id number] DOB: [birth year] Today's Date: 04/27/2018    History of present illness Pt is a 47 y.o. male with past medical history of left MCA stroke, substance abuse (cocaine), smoker, hypertension admitted with an acute right MCA stroke, found to have a right M1 occlusion.  He went for interventional thrombectomy and revascularization with stent placed. Postprocedure, MRI of brain done revealing small areas of acute/early subacute infarct involving right putamen, right mid carotid body right frontal operculum and right middle frontal gyrus white matter as well as punctate focus left posterior temporal lobe.    OT comments  Patient willing to participate in OT session.  Patient completes mobility and transfers with supervision, min guard for tub transfers while stepping over threshold.  Patient continues to report diplopia with positional changes, especially when transitioning from supine but fades quickly.  Reviewed safety in regards to L hand coordination and sensation, hot water/cooking.  Provided and reviewed HEP for L hand strength and coordination.  Completed short blessed test with patient, scoring 2/28 (normal), but patient reports difficulties with short term memory since admission. Pt highly motivated and will benefit from OP neuro OT in order to return to PLOF and IADLs.  Will continue to follow.    Follow Up Recommendations  Outpatient OT;Supervision/Assistance - 24 hour(outpt neuro; 24 hr initally)    Equipment Recommendations  None recommended by OT    Recommendations for Other Services      Precautions / Restrictions Precautions Precautions: Fall Restrictions Weight Bearing Restrictions: No       Mobility Bed Mobility Overal bed mobility: Modified Independent Bed Mobility: Supine to Sit           General bed mobility comments: increased time and effort  Transfers Overall transfer level:  Needs assistance Equipment used: None Transfers: Sit to/from Stand Sit to Stand: Supervision         General transfer comment: for safety    Balance Overall balance assessment: Needs assistance Sitting-balance support: Feet supported;No upper extremity supported Sitting balance-Leahy Scale: Good     Standing balance support: No upper extremity supported;During functional activity Standing balance-Leahy Scale: Fair                   Standardized Balance Assessment Standardized Balance Assessment : Dynamic Gait Index   Dynamic Gait Index Level Surface: Mild Impairment Change in Gait Speed: Normal Gait with Horizontal Head Turns: Mild Impairment Gait with Vertical Head Turns: Mild Impairment Gait and Pivot Turn: Mild Impairment Step Over Obstacle: Mild Impairment Step Around Obstacles: Mild Impairment Steps: Normal Total Score: 18     ADL either performed or assessed with clinical judgement   ADL Overall ADL's : Needs assistance/impaired     Grooming: Supervision/safety;Standing;Wash/dry hands                   Toilet Transfer: Supervision/safety;Ambulation(simulated in room)       Tub/ Shower Transfer: Tub transfer;Min guard;Ambulation Tub/Shower Transfer Details (indicate cue type and reason): min guard for safety stepping over threshold Functional mobility during ADLs: Supervision/safety General ADL Comments: pt reports diplopia with positional changes, especially from supine     Vision   Vision Assessment?: Yes Eye Alignment: Within Functional Limits Ocular Range of Motion: Within Functional Limits Alignment/Gaze Preference: Within Defined Limits Tracking/Visual Pursuits: Able to track stimulus in all quads without difficulty Visual Fields: No apparent deficits Diplopia Assessment: Other (comment)(reports diplopia with positional changes; supine to standing)  Perception     Praxis      Cognition Arousal/Alertness:  Awake/alert Behavior During Therapy: WFL for tasks assessed/performed Overall Cognitive Status: Impaired/Different from baseline Area of Impairment: Memory                     Memory: Decreased short-term memory         General Comments: Short blessed test completed: scoring 2/28 (normal); patient reports decreased STM         Exercises Exercises: Other exercises Other Exercises Other Exercises: reviewed safety due to decreased sensation and coordination of L UE  Other Exercises: provided and reviewed HEP for L hand strength/coordination using theraputty (yellow) completing: gross grasp, rolling, pinch, flattening, and coin placement/removal   Shoulder Instructions       General Comments      Pertinent Vitals/ Pain       Pain Assessment: No/denies pain  Home Living                                          Prior Functioning/Environment              Frequency  Min 2X/week        Progress Toward Goals  OT Goals(current goals can now be found in the care plan section)  Progress towards OT goals: Progressing toward goals  Acute Rehab OT Goals Patient Stated Goal: hopeful to return home soon; regain strength in L side OT Goal Formulation: With patient Time For Goal Achievement: 05/10/18 Potential to Achieve Goals: Good  Plan Discharge plan remains appropriate;Frequency remains appropriate    Co-evaluation                 AM-PAC PT "6 Clicks" Daily Activity     Outcome Measure   Help from another person eating meals?: None Help from another person taking care of personal grooming?: None Help from another person toileting, which includes using toliet, bedpan, or urinal?: A Little Help from another person bathing (including washing, rinsing, drying)?: A Little Help from another person to put on and taking off regular upper body clothing?: None Help from another person to put on and taking off regular lower body clothing?: A  Little 6 Click Score: 21    End of Session Equipment Utilized During Treatment: Gait belt  OT Visit Diagnosis: Unsteadiness on feet (R26.81);Other symptoms and signs involving the nervous system (R29.898)   Activity Tolerance Patient tolerated treatment well   Patient Left in bed;with call bell/phone within reach;with family/visitor present   Nurse Communication Mobility status        Time: 1610-9604 OT Time Calculation (min): 20 min  Charges: OT General Charges $OT Visit: 1 Visit OT Treatments $Self Care/Home Management : 8-22 mins  Chancy Milroy, OT Acute Rehabilitation Services Pager [phone] Office [phone]    Chancy Milroy 04/27/2018, 3:55 PM

## 2018-04-27 NOTE — Progress Notes (Signed)
    [facility] has been requested to perform a transesophageal echocardiogram on Manuel Harrison for evaluation of stroke.  After careful review of history and examination, the risks and benefits of transesophageal echocardiogram have been explained including risks of esophageal damage, perforation (1:10,000 risk), bleeding, pharyngeal hematoma as well as other potential complications associated with conscious sedation including aspiration, arrhythmia, respiratory failure and death. Alternatives to treatment were discussed, questions were answered. Patient is willing to proceed.   TEE is scheduled for 04/28/2018 at 10:00am with Dr. Cristal Deer.  Corrin Parker, PA-C 04/27/2018 4:35 PM

## 2018-04-27 NOTE — Care Management Note (Signed)
Case Management Note  Patient Details  Name: Manuel Harrison MRN: [id number] Date of Birth: 1971/02/18  Subjective/Objective:    Pt admitted with CVA. He is from home with spouse. Pt without a PCP. Pt denies any issues with transportation.   No DME. Pt not taking any prescription medications at home.              Action/Plan: CM met with the patient and his spouse. They were interested in getting set up with one of the [facility]. CM was able to get him an appointment at [facility]. Information on the AVS. CM went over the use of the [facility] pharmacy for medication assistance. If pt d/c late or over the weekend may need MATCH.  Pt has transportation home.   Expected Discharge Date:                  Expected Discharge Plan:  OP Rehab  In-House Referral:     Discharge planning Services  CM Consult, [facility]  Post Acute Care Choice:    Choice offered to:     DME Arranged:    DME Agency:     HH Arranged:    HH Agency:     Status of Service:  Completed, signed off  If discussed at [employer] of Stay Meetings, dates discussed:    Additional Comments:  [name], RN 04/27/2018, 2:51 PM

## 2018-04-28 ENCOUNTER — Inpatient Hospital Stay (HOSPITAL_COMMUNITY): Payer: Self-pay | Admitting: Certified Registered"

## 2018-04-28 ENCOUNTER — Encounter (HOSPITAL_COMMUNITY)
Admission: EM | Disposition: A | Discharge status: Home / Self Care | Payer: Self-pay | Source: Home / Self Care | Attending: Neurology

## 2018-04-28 ENCOUNTER — Encounter (HOSPITAL_COMMUNITY): Payer: Self-pay | Admitting: Cardiology

## 2018-04-28 ENCOUNTER — Emergency Department (HOSPITAL_COMMUNITY)
Admission: EM | Admit: 2018-04-28 | Discharge: 2018-04-29 | Disposition: A | Discharge status: Home / Self Care | Payer: Self-pay | Attending: Emergency Medicine | Admitting: Emergency Medicine

## 2018-04-28 ENCOUNTER — Inpatient Hospital Stay (HOSPITAL_COMMUNITY): Payer: Self-pay

## 2018-04-28 ENCOUNTER — Encounter (HOSPITAL_COMMUNITY): Payer: Self-pay | Admitting: Emergency Medicine

## 2018-04-28 DIAGNOSIS — Z5189 Encounter for other specified aftercare: Secondary | ICD-10-CM

## 2018-04-28 DIAGNOSIS — X58XXXA Exposure to other specified factors, initial encounter: Secondary | ICD-10-CM | POA: Insufficient documentation

## 2018-04-28 DIAGNOSIS — I6389 Other cerebral infarction: Secondary | ICD-10-CM

## 2018-04-28 DIAGNOSIS — Z79899 Other long term (current) drug therapy: Secondary | ICD-10-CM | POA: Insufficient documentation

## 2018-04-28 DIAGNOSIS — Y999 Unspecified external cause status: Secondary | ICD-10-CM | POA: Insufficient documentation

## 2018-04-28 DIAGNOSIS — I63511 Cerebral infarction due to unspecified occlusion or stenosis of right middle cerebral artery: Secondary | ICD-10-CM

## 2018-04-28 DIAGNOSIS — S21112A Laceration without foreign body of left front wall of thorax without penetration into thoracic cavity, initial encounter: Secondary | ICD-10-CM | POA: Insufficient documentation

## 2018-04-28 DIAGNOSIS — Y939 Activity, unspecified: Secondary | ICD-10-CM | POA: Insufficient documentation

## 2018-04-28 DIAGNOSIS — Y929 Unspecified place or not applicable: Secondary | ICD-10-CM | POA: Insufficient documentation

## 2018-04-28 DIAGNOSIS — I10 Essential (primary) hypertension: Secondary | ICD-10-CM | POA: Insufficient documentation

## 2018-04-28 DIAGNOSIS — Z7982 Long term (current) use of aspirin: Secondary | ICD-10-CM | POA: Insufficient documentation

## 2018-04-28 DIAGNOSIS — J302 Other seasonal allergic rhinitis: Secondary | ICD-10-CM | POA: Insufficient documentation

## 2018-04-28 DIAGNOSIS — F17210 Nicotine dependence, cigarettes, uncomplicated: Secondary | ICD-10-CM | POA: Insufficient documentation

## 2018-04-28 HISTORY — PX: TEE WITHOUT CARDIOVERSION: SHX5443

## 2018-04-28 HISTORY — PX: LOOP RECORDER INSERTION: EP1214

## 2018-04-28 LAB — CREATININE, SERUM
Creatinine, Ser: 0.8 mg/dL (ref 0.61–1.24)
GFR calc Af Amer: >60 mL/min (ref >60)
GFR calc non Af Amer: >60 mL/min (ref >60)

## 2018-04-28 LAB — CBC
HCT: 38 % — ABNORMAL LOW (ref 39.0–52.0)
HCT: 36.2 % — ABNORMAL LOW (ref 39.0–52.0)
Hemoglobin: 13.2 g/dL (ref 13.0–17.0)
Hemoglobin: 12.4 g/dL — ABNORMAL LOW (ref 13.0–17.0)
MCH: 31.3 pg (ref 26.0–34.0)
MCH: 31 pg (ref 26.0–34.0)
MCHC: 34.7 g/dL (ref 30.0–36.0)
MCHC: 34.3 g/dL (ref 30.0–36.0)
MCV: 90 fL (ref 78.0–100.0)
MCV: 90.5 fL (ref 78.0–100.0)
Platelets: 204 10*3/uL (ref 150–400)
Platelets: 209 10*3/uL (ref 150–400)
RBC: 4.22 MIL/uL (ref 4.22–5.81)
RBC: 4 MIL/uL — ABNORMAL LOW (ref 4.22–5.81)
RDW: 12.8 % (ref 11.5–15.5)
RDW: 12.9 % (ref 11.5–15.5)
WBC: 5.2 10*3/uL (ref 4.0–10.5)
WBC: 5.7 10*3/uL (ref 4.0–10.5)

## 2018-04-28 LAB — BASIC METABOLIC PANEL
Anion gap: 10 (ref 5–15)
BUN: 10 mg/dL (ref 6–20)
CO2: 22 mmol/L (ref 22–32)
Calcium: 9.1 mg/dL (ref 8.9–10.3)
Chloride: 107 mmol/L (ref 98–111)
Creatinine, Ser: 0.88 mg/dL (ref 0.61–1.24)
GFR calc Af Amer: >60 mL/min (ref >60)
GFR calc non Af Amer: >60 mL/min (ref >60)
Glucose, Bld: 105 mg/dL — ABNORMAL HIGH (ref 70–99)
Potassium: 3.6 mmol/L (ref 3.5–5.1)
Sodium: 139 mmol/L (ref 135–145)

## 2018-04-28 SURGERY — LOOP RECORDER INSERTION

## 2018-04-28 SURGERY — ECHOCARDIOGRAM, TRANSESOPHAGEAL
Anesthesia: Monitor Anesthesia Care

## 2018-04-28 MED ORDER — MIDAZOLAM HCL 5 MG/5ML IJ SOLN
INTRAMUSCULAR | Status: DC | PRN
  Administered 2018-04-28: 2 mg via INTRAVENOUS

## 2018-04-28 MED ORDER — AMLODIPINE BESYLATE 5 MG PO TABS: 5.0000 mg | ORAL_TABLET | Freq: Every day | ORAL | 3 refills | Status: DC

## 2018-04-28 MED ORDER — HEPARIN SODIUM (PORCINE) 5000 UNIT/ML IJ SOLN: 5000.0000 [IU] | Freq: Three times a day (TID) | INTRAMUSCULAR | Status: DC

## 2018-04-28 MED ORDER — ROSUVASTATIN CALCIUM 20 MG PO TABS: 20.0000 mg | ORAL_TABLET | Freq: Every day | ORAL | 0 refills | Status: DC

## 2018-04-28 MED ORDER — LACTATED RINGERS IV SOLN
INTRAVENOUS | Status: DC
  Administered 2018-04-28: 09:00:00 via INTRAVENOUS

## 2018-04-28 MED ORDER — PROPOFOL 10 MG/ML IV BOLUS
INTRAVENOUS | Status: DC | PRN
  Administered 2018-04-28 (×4): 20 mg via INTRAVENOUS

## 2018-04-28 MED ORDER — TICAGRELOR 90 MG PO TABS: 90.0000 mg | ORAL_TABLET | Freq: Two times a day (BID) | ORAL | 3 refills | Status: DC

## 2018-04-28 MED ORDER — VARENICLINE TARTRATE 1 MG PO TABS: 1.0000 mg | ORAL_TABLET | Freq: Two times a day (BID) | ORAL | 0 refills | Status: AC

## 2018-04-28 MED ORDER — ACETAMINOPHEN 325 MG PO TABS: 325.0000 mg | ORAL_TABLET | ORAL | Status: DC | PRN

## 2018-04-28 MED ORDER — VARENICLINE TARTRATE 0.5 MG PO TABS: 0.5000 mg | ORAL_TABLET | Freq: Two times a day (BID) | ORAL | 0 refills | Status: DC

## 2018-04-28 MED ORDER — ASPIRIN 81 MG PO CHEW: 81.0000 mg | CHEWABLE_TABLET | Freq: Every day | ORAL | 12 refills | Status: DC

## 2018-04-28 MED ORDER — ONDANSETRON HCL 4 MG/2ML IJ SOLN: 4.0000 mg | Freq: Four times a day (QID) | INTRAMUSCULAR | Status: DC | PRN

## 2018-04-28 MED ORDER — LIDOCAINE-EPINEPHRINE 1 %-1:100000 IJ SOLN
INTRAMUSCULAR | Status: DC | PRN
  Administered 2018-04-28: 30 mL

## 2018-04-28 MED ORDER — PROPOFOL 500 MG/50ML IV EMUL
INTRAVENOUS | Status: DC | PRN
  Administered 2018-04-28: 50 ug/kg/min via INTRAVENOUS

## 2018-04-28 MED ORDER — LIDOCAINE-EPINEPHRINE 1 %-1:100000 IJ SOLN
INTRAMUSCULAR | Status: AC
  Filled 2018-04-28: qty 1

## 2018-04-28 MED FILL — AMLODIPINE BESYLATE 5 MG TA: 5 | 30 days supply | Qty: 30 | Fill #0

## 2018-04-28 MED FILL — ROSUVASTATIN CALCIUM 20 MG: 20 | 30 days supply | Qty: 30 | Fill #0

## 2018-04-28 SURGICAL SUPPLY — 2 items
LOOP REVEAL LINQSYS (Prosthesis & Implant Heart) ×2 IMPLANT
PACK LOOP INSERTION (CUSTOM PROCEDURE TRAY) ×3 IMPLANT

## 2018-04-28 NOTE — Transfer of Care (Signed)
Immediate Anesthesia Transfer of Care Note  Patient: Manuel Harrison  Procedure(s) Performed: TRANSESOPHAGEAL ECHOCARDIOGRAM (TEE) (N/A )  Patient Location: Endoscopy Unit  Anesthesia Type:MAC  Level of Consciousness: awake, alert  and sedated  Airway & Oxygen Therapy: Patient connected to nasal cannula oxygen  Post-op Assessment: Post -op Vital signs reviewed and stable  Post vital signs: stable  Last Vitals:  Vitals Value Taken Time  BP    Temp    Pulse    Resp    SpO2      Last Pain:  Vitals:   04/28/18 0908  TempSrc: Oral  PainSc: 0-No pain      Patients Stated Pain Goal: 0 (22/02/54 2706)  Complications: No apparent anesthesia complications

## 2018-04-28 NOTE — Progress Notes (Signed)
Pt given discharge instructions. Pt has no questions at this time. Iv and monitor d/c.

## 2018-04-28 NOTE — Discharge Summary (Addendum)
Stroke Discharge Summary  Patient ID: Manuel Harrison   MRN: [id number]      DOB: 08-15-1970  Date of Admission: 04/25/2018 Date of Discharge: 04/28/2018  Attending Physician:  Micki Riley, MD, Stroke MD Consultant(s):    Neuro interventional list Dr. Allie Dimmer for Patient's PCP:  Patient, No Pcp Per  DISCHARGE DIAGNOSIS: Right MCA infarct status post thrombectomy and stent in the right M1 occlusion  Etiology cryptogenic Active Problems: Cocaine abuse Hypertension Dyslipidemia Tobacco abuse Gholami use   Past Medical History:  Diagnosis Date  . 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 Arthritis    "left knee" (04/26/2018)  . High cholesterol   . History of gout   . Hypertension   . Stroke [facility]) 09/2012   "right side is weaker since" (10//08/2017)   Past Surgical History:  Procedure Laterality Date  . IR CT HEAD LTD  04/25/2018  . IR INTRA CRAN STENT  04/25/2018  . IR PERCUTANEOUS ART THROMBECTOMY/INFUSION INTRACRANIAL INC DIAG ANGIO  04/25/2018  . RADIOLOGY WITH ANESTHESIA N/A 04/25/2018   Procedure: RADIOLOGY WITH ANESTHESIA;  Surgeon: Julieanne Cotton, MD;  Location: [facility] OR;  Service: Radiology;  Laterality: N/A;    Allergies as of 04/28/2018      Reactions   Aleve [naproxen Sodium] Nausea And Vomiting      Medication List    STOP taking these medications   atorvastatin 10 MG tablet Commonly known as:  LIPITOR   diclofenac sodium 1 % Gel Commonly known as:  VOLTAREN   doxycycline 100 MG capsule Commonly known as:  VIBRAMYCIN   hydrochlorothiazide 12.5 MG capsule Commonly known as:  MICROZIDE   HYDROcodone-acetaminophen 5-325 MG tablet Commonly known as:  NORCO/VICODIN   loratadine 10 MG tablet Commonly known as:  CLARITIN   naproxen 500 MG tablet Commonly known as:  NAPROSYN   ondansetron 4 MG tablet Commonly known as:  ZOFRAN     TAKE these medications   amLODipine 5 MG tablet Commonly known  as:  NORVASC Take 1 tablet (5 mg total) by mouth daily.   aspirin 81 MG chewable tablet Chew 1 tablet (81 mg total) by mouth daily.   rosuvastatin 20 MG tablet Commonly known as:  CRESTOR Take 1 tablet (20 mg total) by mouth daily at 6 PM.   ticagrelor 90 MG Tabs tablet Commonly known as:  BRILINTA Take 1 tablet (90 mg total) by mouth 2 (two) times daily.   varenicline 0.5 MG tablet Commonly known as:  CHANTIX Take 1 tablet (0.5 mg total) by mouth 2 (two) times daily. Start taking on:  04/29/2018   varenicline 1 MG tablet Commonly known as:  CHANTIX Take 1 tablet (1 mg total) by mouth 2 (two) times daily. Start taking on:  05/03/2018       LABORATORY STUDIES CBC    Component Value Date/Time   WBC 5.2 04/28/2018 1226   RBC 4.22 04/28/2018 1226   HGB 13.2 04/28/2018 1226   HCT 38.0 (L) 04/28/2018 1226   PLT 204 04/28/2018 1226   MCV 90.0 04/28/2018 1226   MCH 31.3 04/28/2018 1226   MCHC 34.7 04/28/2018 1226   RDW 12.8 04/28/2018 1226   LYMPHSABS 1.7 04/26/2018 0543   MONOABS 0.4 04/26/2018 0543   EOSABS 0.1 04/26/2018 0543   BASOSABS 0.0 04/26/2018 0543   CMP    Component Value Date/Time   NA 140 04/27/2018 0512   K 3.5 04/27/2018  0512   CL 111 04/27/2018 0512   CO2 23 04/27/2018 0512   GLUCOSE 99 04/27/2018 0512   BUN 5 (L) 04/27/2018 0512   CREATININE 0.80 04/27/2018 0512   CREATININE 1.04 09/18/2013 1513   CALCIUM 8.1 (L) 04/27/2018 0512   PROT 6.3 (L) 04/25/2018 0422   ALBUMIN 4.0 04/25/2018 0422   AST 25 04/25/2018 0422   ALT 12 04/25/2018 0422   ALKPHOS 76 04/25/2018 0422   BILITOT 0.8 04/25/2018 0422   GFRNONAA >60 04/27/2018 0512   GFRNONAA 87 09/18/2013 1513   GFRAA >60 04/27/2018 0512   GFRAA >89 09/18/2013 1513   COAGS Lab Results  Component Value Date   INR 0.90 04/25/2018   Lipid Panel    Component Value Date/Time   CHOL 139 04/25/2018 1020   TRIG 790 (H) 04/25/2018 1020   HDL 33 (L) 04/25/2018 1020   CHOLHDL 4.2 04/25/2018 1020    VLDL UNABLE TO CALCULATE IF TRIGLYCERIDE OVER 400 mg/dL 21/30/8657 8469   LDLCALC UNABLE TO CALCULATE IF TRIGLYCERIDE OVER 400 mg/dL 62/95/2841 3244   [id number]  Lab Results  Component Value Date   HGBA1C 5.6 04/25/2018   Urinalysis    Component Value Date/Time   COLORURINE COLORLESS (A) 04/25/2018 1005   APPEARANCEUR CLEAR 04/25/2018 1005   LABSPEC 1.009 04/25/2018 1005   PHURINE 7.0 04/25/2018 1005   GLUCOSEU NEGATIVE 04/25/2018 1005   HGBUR NEGATIVE 04/25/2018 1005   BILIRUBINUR NEGATIVE 04/25/2018 1005   KETONESUR NEGATIVE 04/25/2018 1005   PROTEINUR NEGATIVE 04/25/2018 1005   NITRITE NEGATIVE 04/25/2018 1005   LEUKOCYTESUR NEGATIVE 04/25/2018 1005   Urine Drug Screen     Component Value Date/Time   LABOPIA NONE DETECTED 04/25/2018 1005   COCAINSCRNUR NONE DETECTED 04/25/2018 1005   LABBENZ NONE DETECTED 04/25/2018 1005   AMPHETMU NONE DETECTED 04/25/2018 1005   THCU NONE DETECTED 04/25/2018 1005   LABBARB NONE DETECTED 04/25/2018 1005    Alcohol Level    Component Value Date/Time   ETH <10 04/25/2018 0422     SIGNIFICANT DIAGNOSTIC STUDIES MR Brain WO Contrast 04/26/2018 IMPRESSION: 1. Small areas of acute/early subacute infarction involving the right putamen, right mid caudate body, right frontal operculum, and the right middle frontal gyrus white matter. Additional punctate focus of acute/early subacute infarction within the left posterior temporal periventricular white matter. No associated hemorrhage or mass effect. 2. Chronic lacunar infarct of the left basal ganglia. 3. Mild chronic microvascular ischemic changes and volume loss of the brain. 4. Mild paranasal sinus mucosal thickening.  Ct Angio Head W Or Wo Contrast Ct Angio Neck W Or Wo Contrast 04/25/2018 IMPRESSION:   CTA NECK:  1. No hemodynamically significant stenosis ICA's.  2. Patent vertebral arteries.   CTA HEAD:  1. Emergent RIGHT M1 occlusion with intermediate collaterals.  2.  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   Ct Cerebral Perfusion W Contrast 04/25/2018 IMPRESSION:  RIGHT frontoparietal/MCA territory brain at risk without core infarct though, limited by patient motion and bilateral occluded MCA.    Ct Head Code Stroke Wo Contrast 04/25/2018 IMPRESSION:  1. Mildly dense RIGHT MCA concerning for thromboembolism.  2. Small age-indeterminate RIGHT frontal infarct, new from 2014.  3. Old LEFT basal ganglia infarct.  4. ASPECTS is 9.  DG Chest Portable 04/26/2018 IMPRESSION: 1. Removal of endotracheal tube and NG tube. 2. Improved aeration. No active lung disease.  S/P RT common carotid arteriogram - Dr Corliss Skains RT CFA approach. Findings. Occluded RT MCA M 1 seg followed by  complete revascularization of RT MCA M1 With x 1 pass with embotrap 5mm x 33 mm retriever device achieving a TICI 3 revascularization . Underlying stenosis worsening despite IA nitroglycerin And superselective IA 5 mg of verapamil to complete re occlusion reqiuring rescue stent with recovery of TICI 3 revascularization.  Transthoracic Echocardiogram 04/25/2018 Study Conclusions - Left ventricle: The cavity size was normal. Wall thickness was increased in a pattern of moderate LVH. Systolic function was normal. The estimated ejection fraction was in the range of 60% to 65%. Wall motion was normal; there were no regional wall motion abnormalities. Features are consistent with a pseudonormal left ventricular filling pattern, with concomitant abnormal relaxation and increased filling pressure (grade 2 diastolic dysfunction).     HISTORY OF PRESENT ILLNESS Manuel Harrison is an 47 y.o. male with past medical history of left MCA stroke, substance abuse (cocaine), smoker, hypertension presents with acute onset left-sided plegia, gaze deviation.  He was brought by EMS as a stroke alert.   Initially last seen normal was stated to be 11:30 PM,  however patient's girlfriend said that patient had an episode of left-sided weakness and facial droop at 7:30 PM lasting for about 5 minutes.  Following which she was in the porch and ate dinner.  Went back to sleep at 8:30 PM.  When his mother arrived at 11:30 PM patient was not acting right however, however did not notice any obvious left-sided weakness or facial droop.  Arm 4 AM to go from woke up she noticed patient was not moving the left side and the facial droop and called 9 11.  On arrival, exam is consistent with a right MCA stroke.  A stat CT scan was obtained which showed a hyperdense right MCA and CT angiogram performed confirmed a right M1 cut off.  CT angiogram also shows a old left MCA occlusion as well.  CT perfusion was performed which shows no core approximately 50 cc penumbra. Patient taken to emergent Neuro IR.   HOSPITAL COURSE Manuel Harrison is a 47 y.o. male with history of prior stroke, htn, tobacco use and substance abuse presenting with acute onset left-sided plegia, gaze deviation.[name] He did not receive IV t-PA due to late presentation. S/P Thrombectomy / stent Rt M1 occlusion in IR with TICI 3 revascularization. patient made excellent clinical recovery and had only mild right hand weakness and diminished fine motor skills. Transthoracic echo and transesophageal echo were both unremarkable. He had loop recorder inserted to look for paroxysmal A. Fib. He was started on aspirin and Brilinta for stroke prevention and advise to be compliant with his medicines and aggressive risk factor modification. He was also counseled to quit smoking cigarettes and alcohol and agreed with the plan. He was started on Chantix to decrease the craving for smoking Stroke:  Rt MCA infarct - embolic - likely related to cocaine abuse.[name]  Resultant  Mild left hemiparesis  CT head - Mildly dense RIGHT MCA concerning for thromboembolism.   MRI head - Small areas of acute/early subacute infarction  involving the right putamen, right mid caudate body, right frontal operculum, and the right middle frontal gyrus white matter. Additional punctate focus of acute/early subacute infarction within the left posterior temporal periventricular white matter  MRA head - not performed  CTA H&N - .Emergent Rt M1 occlusion with intermediate collaterals. Chronic Lt M1 occlusion with intermediate collaterals.   Carotid Doppler - CTA neck performed - carotid dopplers not indicated.  UDS - negative  HIV  antibody - non reactive  2D Echo  - EF 60 - 65%. No cardiac source of emboli identified.   TEE normal  LDL - unable to calculate due to elevated triglycerides  HgbA1c - 5.6  VTE prophylaxis - SCDs  Diet -cardiac heart healthy diet with thin liquids  No antithrombotic prior to admission, now on aspirin 81 mg and Brilinta 90 mg by mouth twice a day for the next 90 days due to intracranial stent  Patient counseled to be compliant with his antithrombotic medications  Ongoing aggressive stroke risk factor management  Therapy recommendations:  Home with out patient PT/OT at neuro rehab  Dr. Pearlean Brownie had a long conversation with patient and his wife at the bedside regarding participating in the [place] trial for stroke prevention, patient declined due to not receiving adequate compensation, but upon further discussion he states he will consider.  Patient is status post TEE with showed: No evidence of embolic source. No significant valve abnormalities. No LA/LAA or RA/RAA thrombus. No PFO by color doppler. No evidence of shunt with bubble study.  Patient status loop recorder  By cardiology  Disposition:  Home   Hypertension  Stable  Continue home regimen: amlodipine  Long-term BP goal normotensive  Hyperlipidemia  Lipid lowering medication PTA:  Lipitor 10 mg daily  LDL >400, goal < 70  Current lipid lowering medication: Crestor 20 mg by mouth daily  Continue statin at  discharge   Other Stroke Risk Factors  Cigarette smoker - advised to stop smoking started on Chantix while in hospital  ETOH use, advised to drink no more than 1 alcoholic beverage per day.   Body mass index is 24.33 kg/m.  Hx stroke/TIA  Substance abuse history     DISCHARGE EXAM Blood pressure 132/86, pulse (!) 58, temperature 97.9 F (36.6 C), temperature source Oral, resp. rate 20, height 5\' 8"  (1.727 m), weight 77.1 kg, SpO2 100 %. Awake alert oriented 3 with normal speech and language function. Pupils equal reactive. Fundi not visualized. Mild left lower facial weakness. Tongue midline. Mild left hemiplegia with left upper extremity 4 +/5 strength.diminished fine finger movements on the left. Orbits right over left upper extremity. Sensation appears preserved.  Gait: Pt has been ambulatory in hallway   Discharge Diet    Diet Order            Diet Heart Room service appropriate? Yes; Fluid consistency: Thin  Diet effective now             liquids  DISCHARGE PLAN  Disposition: Home with outpatient rehab  aspirin 81 mg daily  And Brillinta 90mg  BID for secondary stroke prevention.  Ongoing risk factor control by Primary Care Physician at time of discharge  Follow-up Patient, No Pcp Per in 2 weeks.  Follow up with [facility] for loop recorder f/u  Follow-up in [facility] in 6 weeks, office to schedule an appointment.   Consider possible participation in the [place] stroke trial if interested  45 minutes were spent preparing discharge.   Manuel Harrison 04/28/18 12:52 PM  I have personally examined this patient, reviewed notes, independently viewed imaging studies, participated in medical decision making and plan of care.ROS completed by me personally and pertinent positives fully documented  I have made any additions or clarifications directly to the above note. Agree with note above.  Patient was advised to be  compliant with his antiplatelet therapy and follow-up in the stroke clinic in 6 weeks. He  may also consider possible participation in the [place] stroke trial if interested  Delia Heady, MD Medical Director [facility] Pager: [phone] 04/28/2018 2:01 PM

## 2018-04-28 NOTE — ED Triage Notes (Signed)
Pt reports bleeding from site of loop recorder today, reports left the hospital earlier today, admitted for stroke on Tuesday. Recently started brilenta. Reports he had a "stencil" in his brain.

## 2018-04-28 NOTE — Progress Notes (Signed)
Physical Therapy Treatment Patient Details Name: Manuel Harrison MRN: [id number] DOB: May 01, 1971 Today's Date: 04/28/2018    History of Present Illness Pt is a 47 y.o. male with past medical history of left MCA stroke, substance abuse (cocaine), smoker, hypertension admitted with an acute right MCA stroke, found to have a right M1 occlusion.  He went for interventional thrombectomy and revascularization with stent placed. Postprocedure, MRI of brain done revealing small areas of acute/early subacute infarct involving right putamen, right mid carotid body right frontal operculum and right middle frontal gyrus white matter as well as punctate focus left posterior temporal lobe.     PT Comments    Patient progressing well towards PT goals. Scored 20/24 on DGI indicating improvement in balance from prior session (scored 18/24) and less of a fall risk. Continues to report some weakness in LLE but no shakes/tremors noted with mobility today. Tolerated stair training with supervision for safety. Pt eager to d/c home today. Would benefit from neuro PPT for higher level balance. Will follow if still in hospital.   Follow Up Recommendations  Outpatient PT;Supervision - Intermittent     Equipment Recommendations  None recommended by PT    Recommendations for Other Services       Precautions / Restrictions Precautions Precautions: Fall Restrictions Weight Bearing Restrictions: No    Mobility  Bed Mobility Overal bed mobility: Modified Independent Bed Mobility: Supine to Sit;Sit to Supine     Supine to sit: Modified independent (Device/Increase time);HOB elevated Sit to supine: Modified independent (Device/Increase time);HOB elevated      Transfers Overall transfer level: Modified independent Equipment used: None Transfers: Sit to/from Stand Sit to Stand: Modified independent (Device/Increase time)         General transfer comment: Stood from EOBx 1.    Ambulation/Gait Ambulation/Gait assistance: Supervision Gait Distance (Feet): 350 Feet Assistive device: None Gait Pattern/deviations: Step-through pattern;Decreased stride length;Drifts right/left     General Gait Details: No shakes/tremors noted today; some drifting to right/left but no overt LOB. Tolerated higher level balance activities. Se[place]alance section.   Stairs Stairs: Yes Stairs assistance: Supervision Stair Management: Alternating pattern;Step to pattern;One rail Right Number of Stairs: 13 General stair comments: Cues for technique/safety.    Wheelchair Mobility    Modified Rankin (Stroke Patients Only) Modified Rankin (Stroke Patients Only) Pre-Morbid Rankin Score: No significant disability Modified Rankin: Moderate disability     Balance Overall balance assessment: Needs assistance Sitting-balance support: Feet supported;No upper extremity supported Sitting balance-Leahy Scale: Good     Standing balance support: During functional activity Standing balance-Leahy Scale: Fair               High level balance activites: Backward walking;Direction changes;Turns;Sudden stops;Head turns High Level Balance Comments: Tolerated above with mild devations in gait but no overt LOB. Standardized Balance Assessment Standardized Balance Assessment : Dynamic Gait Index   Dynamic Gait Index Level Surface: Normal Change in Gait Speed: Normal Gait with Horizontal Head Turns: Mild Impairment Gait with Vertical Head Turns: Normal Gait and Pivot Turn: Normal Step Over Obstacle: Mild Impairment Step Around Obstacles: Mild Impairment Steps: Mild Impairment Total Score: 20      Cognition Arousal/Alertness: Awake/alert Behavior During Therapy: WFL for tasks assessed/performed Overall Cognitive Status: Within Functional Limits for tasks assessed                                 General Comments: for basic mobility tasks. Reports  memory has improved.        Exercises      General Comments        Pertinent Vitals/Pain Pain Assessment: No/denies pain    Home Living                      Prior Function            PT Goals (current goals can now be found in the care plan section) Progress towards PT goals: Progressing toward goals    Frequency    Min 4X/week      PT Plan Current plan remains appropriate    Co-evaluation              AM-PAC PT "6 Clicks" Daily Activity  Outcome Measure  Difficulty turning over in bed (including adjusting bedclothes, sheets and blankets)?: None Difficulty moving from lying on back to sitting on the side of the bed? : None Difficulty sitting down on and standing up from a chair with arms (e.g., wheelchair, bedside commode, etc,.)?: None Help needed moving to and from a bed to chair (including a wheelchair)?: None Help needed walking in hospital room?: A Little Help needed climbing 3-5 steps with a railing? : A Little 6 Click Score: 22    End of Session Equipment Utilized During Treatment: Gait belt Activity Tolerance: Patient tolerated treatment well Patient left: in bed;with call bell/phone within reach;with family/visitor present Nurse Communication: Mobility status PT Visit Diagnosis: Unsteadiness on feet (R26.81);Hemiplegia and hemiparesis Hemiplegia - Right/Left: Left Hemiplegia - dominant/non-dominant: Non-dominant Hemiplegia - caused by: Cerebral infarction     Time: 1610-9604 PT Time Calculation (min) (ACUTE ONLY): 14 min  Charges:  $Neuromuscular Re-education: 8-22 mins                     Mylo Red, PT, DPT Acute Rehabilitation Services Pager [phone] Office [phone]       Blake Divine A Margurette Brener 04/28/2018, 3:10 PM

## 2018-04-28 NOTE — Interval H&P Note (Signed)
History and Physical Interval Note:  04/28/2018 9:19 AM  Manuel Harrison  has presented today for surgery, with the diagnosis of stroke  The various methods of treatment have been discussed with the patient and family. After consideration of risks, benefits and other options for treatment, the patient has consented to  Procedure(s): TRANSESOPHAGEAL ECHOCARDIOGRAM (TEE) (N/A) as a surgical intervention .  The patient's history has been reviewed, patient examined, no change in status, stable for surgery.  I have reviewed the patient's chart and labs.  Questions were answered to the patient's satisfaction.     Nazli Penn Cristal Deer

## 2018-04-28 NOTE — Anesthesia Postprocedure Evaluation (Signed)
Anesthesia Post Note  Patient: Manuel Harrison  Procedure(s) Performed: TRANSESOPHAGEAL ECHOCARDIOGRAM (TEE) (N/A ) BUBBLE STUDY     Patient location during evaluation: Endoscopy Anesthesia Type: MAC Level of consciousness: awake and alert Pain management: pain level controlled Vital Signs Assessment: post-procedure vital signs reviewed and stable Respiratory status: spontaneous breathing, nonlabored ventilation, respiratory function stable and patient connected to nasal cannula oxygen Cardiovascular status: stable and blood pressure returned to baseline Postop Assessment: no apparent nausea or vomiting Anesthetic complications: no    Last Vitals:  Vitals:   04/28/18 1110 04/28/18 1240  BP: 124/76 132/86  Pulse: 65 (!) 58  Resp: (!) 24 20  Temp:  36.6 C  SpO2: 100% 100%    Last Pain:  Vitals:   04/28/18 1240  TempSrc: Oral  PainSc:                  [name]

## 2018-04-28 NOTE — Discharge Instructions (Signed)
Post implant site care instructions °Keep incision clean and dry for 3 days..  °You can remove outer dressing tomorrow. °Leave steri-strips (little pieces of tape) on until seen in the office for wound check appointment. °Call the office ([phone]) for redness, drainage, swelling, or fever. ° °

## 2018-04-28 NOTE — Care Management Note (Signed)
Case Management Note  Patient Details  Name: ASHLEIGH ARYA MRN: [id number] Date of Birth: 1970/11/08  Subjective/Objective:                    Action/Plan: Pt to get medications filled at [facility] all except Chantix since they will have to order this with a patient assistance form. CM updated the patient and wife and CM provided them coupon card to get Chantix at another pharmacy.  Pt has 30 day free card for Brilinta and CM faxed the filled out Patient Assistance form to [employer]. Copies given to wife.  Pt has transportation home.   Expected Discharge Date:  04/28/18               Expected Discharge Plan:  OP Rehab  In-House Referral:     Discharge planning Services  CM Consult, Indigent Health Clinic  Post Acute Care Choice:    Choice offered to:     DME Arranged:    DME Agency:     HH Arranged:    HH Agency:     Status of Service:  Completed, signed off  If discussed at [employer], dates discussed:    Additional Comments:  Kermit Balo, RN 04/28/2018, 2:38 PM

## 2018-04-28 NOTE — CV Procedure (Signed)
Brief TEE note; full report to follow in [facility].  No evidence of embolic source. No significant valve abnormalities. No LA/LAA or RA/RAA thrombus. No PFO by color doppler. No evidence of shunt with bubble study.  Well tolerated, no adverse events. Sedation managed by anesthesia team.  Jodelle Red, MD, PhD [facility]  [address], [address] [place], [place] [address] [phone]

## 2018-04-28 NOTE — Consult Note (Addendum)
ELECTROPHYSIOLOGY CONSULT NOTE  Patient ID: Manuel Harrison MRN: [id number], DOB/AGE: [birth year]   Admit date: 04/25/2018 Date of Consult: 04/28/2018  Primary Physician: Patient, No Pcp Per Primary Cardiologist: None Reason for Consultation: Cryptogenic stroke ; recommendations regarding Implantable Loop Recorder, requested by Dr. Pearlean Brownie  History of Present Illness ROVERTO BODMER was admitted on 04/25/2018 with acute stroke.  PMHx includes prior stroke, HTN, smoker, marijuana (coacine is mentioned in chart) his tox sceen negative, ETOH use, They first developed symptoms while at home.  Imaging demonstrated Rt MCA infarct - embolic.  he has undergone workup for stroke including echocardiogram and carotid angio.  The patient has been monitored on telemetry which has demonstrated sinus rhythm with no arrhythmias.  Inpatient stroke work-up is to be completed with a TEE.   Echocardiogram this admission demonstrated    04/25/2018 Study Conclusions - Left ventricle: The cavity size was normal. Wall thickness was increased in a pattern of moderate LVH. Systolic function was normal. The estimated ejection fraction was in the range of 60% to 65%. Wall motion was normal; there were no regional wall motion abnormalities. Features are consistent with a pseudonormal left ventricular filling pattern, with concomitant abnormal relaxation and increased filling pressure (grade 2 diastolic dysfunction).   Lab work is reviewed.  Prior to admission, the patient denies chest pain, shortness of breath, dizziness, palpitations, or syncope.  They are recovering from their stroke with plans to home at discharge.   Past Medical History:  Diagnosis Date  . 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 Arthritis    "left knee" (04/26/2018)  . High cholesterol   . History of gout   . Hypertension   . Stroke [facility]) 09/2012   "right side is weaker  since" (10//08/2017)     Surgical History:  Past Surgical History:  Procedure Laterality Date  . IR CT HEAD LTD  04/25/2018  . IR INTRA CRAN STENT  04/25/2018  . IR PERCUTANEOUS ART THROMBECTOMY/INFUSION INTRACRANIAL INC DIAG ANGIO  04/25/2018  . RADIOLOGY WITH ANESTHESIA N/A 04/25/2018   Procedure: RADIOLOGY WITH ANESTHESIA;  Surgeon: Julieanne Cotton, MD;  Location: [facility] OR;  Service: Radiology;  Laterality: N/A;     Medications Prior to Admission  Medication Sig Dispense Refill Last Dose  . atorvastatin (LIPITOR) 10 MG tablet Take 1 tablet (10 mg total) by mouth daily at 6 PM. (Patient not taking: Reported on 09/29/2016) 30 tablet 3 Not Taking at Unknown time  . diclofenac sodium (VOLTAREN) 1 % GEL Apply 4 g topically 4 (four) times daily. (Patient not taking: Reported on 09/29/2016) 1 Tube 3 Not Taking at Unknown time  . doxycycline (VIBRAMYCIN) 100 MG capsule Take 1 capsule (100 mg total) by mouth 2 (two) times daily. One po bid x 7 days (Patient not taking: Reported on 09/29/2016) 14 capsule 0 Not Taking at Unknown time  . hydrochlorothiazide (MICROZIDE) 12.5 MG capsule Take 1 capsule (12.5 mg total) by mouth daily. (Patient not taking: Reported on 09/29/2016) 30 capsule 11 Not Taking at Unknown time  . HYDROcodone-acetaminophen (NORCO) 5-325 MG tablet Take 1-2 tablets by mouth every 6 (six) hours as needed for severe pain. (Patient not taking: Reported on 09/29/2016) 15 tablet 0 Not Taking at Unknown time  . loratadine (CLARITIN) 10 MG tablet Take 1 tablet (10 mg total) by mouth daily. (Patient not taking: Reported on 09/29/2016) 30 tablet 2 Not Taking at Unknown time  . naproxen (NAPROSYN) 500  MG tablet Take 1 tablet (500 mg total) by mouth 2 (two) times daily as needed for mild pain, moderate pain or headache (TAKE WITH MEALS.). (Patient not taking: Reported on 09/29/2016) 20 tablet 0 Not Taking at Unknown time  . ondansetron (ZOFRAN) 4 MG tablet Take 1 tablet (4 mg total) by mouth every 6 (six) hours as  needed for nausea or vomiting. (Patient not taking: Reported on 04/25/2018) 12 tablet 0 Not Taking at Unknown time    Inpatient Medications:  . amLODipine  5 mg Oral Daily  . aspirin  81 mg Oral Daily  . rosuvastatin  20 mg Oral q1800  . ticagrelor  90 mg Oral BID  . varenicline  0.5 mg Oral Daily   Followed by  . [START ON 04/29/2018] varenicline  0.5 mg Oral BID   Followed by  . [START ON 05/03/2018] varenicline  1 mg Oral BID    Allergies:  Allergies  Allergen Reactions  . Aleve [Naproxen Sodium] Nausea And Vomiting    Social History   Socioeconomic History  . Marital status: Single    Spouse name: Not on file  . Number of children: Not on file  . Years of education: Not on file  . Highest education level: Not on file  Occupational History  . Not on file  Social Needs  . Financial resource strain: Not on file  . Food insecurity:    Worry: Not on file    Inability: Not on file  . Transportation needs:    Medical: Not on file    Non-medical: Not on file  Tobacco Use  . Smoking status: Current Every Day Smoker    Packs/day: 2.00    Years: 32.00    Pack years: 64.00    Types: Cigarettes  . Smokeless tobacco: Never Used  Substance and Sexual Activity  . Alcohol use: Yes    Alcohol/week: 47.0 standard drinks    Types: 47 Cans of beer per week    Comment: 04/26/2018 "2- 40 ounce cans/day"  . Drug use: Yes    Types: Marijuana    Comment: 04/26/2018 "nothing regular; might smoke weekly, if that"  . Sexual activity: Yes  Lifestyle  . Physical activity:    Days per week: Not on file    Minutes per session: Not on file  . Stress: Not on file  Relationships  . Social connections:    Talks on phone: Not on file    Gets together: Not on file    Attends religious service: Not on file    Active member of club or organization: Not on file    Attends meetings of clubs or organizations: Not on file    Relationship status: Not on file  . Intimate partner violence:     Fear of current or ex partner: Not on file    Emotionally abused: Not on file    Physically abused: Not on file    Forced sexual activity: Not on file  Other Topics Concern  . Not on file  Social History Narrative  . Not on file     History reviewed. The patient denies any known family medical history.    Review of Systems: All other systems reviewed and are otherwise negative except as noted above.  Physical Exam: Vitals:   04/27/18 1558 04/27/18 1956 04/27/18 2344 04/28/18 0316  BP: (!) 144/79 133/82 133/69 129/82  Pulse: 68 70 77 79  Resp: 16 20 18 20   Temp: 98.4 F (36.9 C)  98.4 F (36.9 C) 98 F (36.7 C) 98.3 F (36.8 C)  TempSrc: Oral Oral Oral Oral  SpO2: 98% 100% 100% 100%  Height:        GEN- The patient is well appearing, alert and oriented x 3 today.   Head- normocephalic, atraumatic Eyes-  Sclera clear, conjunctiva pink Ears- hearing intact Oropharynx- clear Neck- supple Lungs- CTA b/l, normal work of breathing Heart- RRR, no murmurs, rubs or gallops  GI- soft, NT, ND Extremities- no clubbing, cyanosis, or edema MS- no significant deformity or atrophy Skin- no rash or lesion Psych- euthymic mood, full affect   Labs:   Lab Results  Component Value Date   WBC 5.8 04/26/2018   HGB 12.5 (L) 04/26/2018   HCT 34.6 (L) 04/26/2018   MCV 91.3 04/26/2018   PLT 317 04/26/2018    Recent Labs  Lab 04/25/18 0422  04/27/18 0512  NA 137   < > 140  K 4.0   < > 3.5  CL 108   < > 111  CO2 22   < > 23  BUN 9   < > 5*  CREATININE 1.04   < > 0.80  CALCIUM 8.7*   < > 8.1*  PROT 6.3*  --   --   BILITOT 0.8  --   --   ALKPHOS 76  --   --   ALT 12  --   --   AST 25  --   --   GLUCOSE 136*   < > 99   < > = values in this interval not displayed.   No results found for: CKTOTAL, CKMB, CKMBINDEX, TROPONINI Lab Results  Component Value Date   CHOL 139 04/25/2018   CHOL 237 (H) 09/18/2013   CHOL 204 (H) 01/30/2013   Lab Results  Component Value Date    HDL 33 (L) 04/25/2018   HDL 31 (L) 09/18/2013   HDL 31 (L) 01/30/2013   Lab Results  Component Value Date   LDLCALC UNABLE TO CALCULATE IF TRIGLYCERIDE OVER 400 mg/dL 16/04/9603   LDLCALC NOT CALC 09/18/2013   LDLCALC 97 01/30/2013   Lab Results  Component Value Date   TRIG 790 (H) 04/25/2018   TRIG 651 (H) 09/18/2013   TRIG 382 (H) 01/30/2013   Lab Results  Component Value Date   CHOLHDL 4.2 04/25/2018   CHOLHDL 7.6 09/18/2013   CHOLHDL 6.6 01/30/2013   No results found for: LDLDIRECT  Lab Results  Component Value Date   DDIMER <0.27 06/26/2012     Radiology/Studies:   Ct Angio Head W Or Wo Contrast Result Date: 04/25/2018 CLINICAL DATA:  LEFT-sided weakness, slurred speech. Last seen normal at 2330 hours. Follow up code stroke. EXAM: CT ANGIOGRAPHY HEAD AND NECK TECHNIQUE: Multidetector CT imaging of the head and neck was performed using the standard protocol during bolus administration of intravenous contrast. Multiplanar CT image reconstructions and MIPs were obtained to evaluate the vascular anatomy. Carotid stenosis measurements (when applicable) are obtained utilizing NASCET criteria, using the distal internal carotid diameter as the denominator. CONTRAST:  50mL ISOVUE-370 IOPAMIDOL (ISOVUE-370) INJECTION 76% COMPARISON:  MRA head October 06, 2012 and CT HEAD April 25, 2018. FINDINGS: CTA NECK FINDINGS: AORTIC ARCH: Normal appearance of the thoracic arch, 2 vessel arch is a normal variant. The origins of the innominate, left Common carotid artery and subclavian artery are widely patent. RIGHT CAROTID SYSTEM: Common carotid artery is patent, extrinsic compression by laryngeal cartilage. Normal appearance of the carotid bifurcation  without hemodynamically significant stenosis by NASCET criteria. Mild luminal irregularity proximal internal carotid artery most compatible with atherosclerosis. Tonsillar loop. LEFT CAROTID SYSTEM: Common carotid artery is patent. Normal appearance of  the carotid bifurcation without hemodynamically significant stenosis by NASCET criteria. Normal appearance of the internal carotid artery. VERTEBRAL ARTERIES:RIGHT vertebral artery is dominant. Normal appearance of the vertebral arteries, widely patent. SKELETON: No acute osseous process though bone windows have not been submitted. Poor dentition. Moderate cervical spondylosis. OTHER NECK: Soft tissues of the neck are nonacute though, not tailored for evaluation. UPPER CHEST: Included lung apices are clear. No superior mediastinal lymphadenopathy. CTA HEAD FINDINGS: ANTERIOR CIRCULATION: Patent cervical internal carotid arteries, petrous, cavernous and supra clinoid internal carotid arteries. Patent anterior communicating artery. Occluded distal RIGHT M1 segment with intermediate collateralization. Chronically occluded LEFT MCA with Intermedia collaterals. No large vessel occlusion, significant stenosis, contrast extravasation or aneurysm. POSTERIOR CIRCULATION: Patent vertebral arteries, vertebrobasilar junction and basilar artery, as well as main branch vessels. Patent posterior cerebral arteries. Robust bilateral posterior communicating arteries present. No large vessel occlusion, significant stenosis, contrast extravasation or aneurysm. VENOUS SINUSES: Major dural venous sinuses are patent though not tailored for evaluation on this angiographic examination. ANATOMIC VARIANTS: None. DELAYED PHASE: Not performed. MIP images reviewed. IMPRESSION: CTA NECK: 1. No hemodynamically significant stenosis ICA's. 2. Patent vertebral arteries. CTA HEAD: 1. Emergent RIGHT M1 occlusion with intermediate collaterals. 2. 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 Critical Value/emergent results text paged to Dr.SUSHANTH AROOR via [employer] secure system on 04/25/2018 at 4:40 am, including interpreting physician's phone number. Electronically Signed   By: Awilda Metro M.D.   On: 04/25/2018 04:48    Mr Brain Wo  Contrast Result Date: 04/26/2018 CLINICAL DATA:  47 y/o M; stroke post mechanical thrombectomy of right MCA M1 occlusion with revascularization. EXAM: MRI HEAD WITHOUT CONTRAST TECHNIQUE: Multiplanar, multiecho pulse sequences of the brain and surrounding structures were obtained without intravenous contrast. COMPARISON:  04/25/2018 cerebral angiogram. 04/25/2018 CT head, CTA head, CT perfusion. FINDINGS: Brain: Reduced diffusion is present within the right putamen and mid caudate body as well as approximately 10 mm foci within the right frontal operculum and right middle frontal gyrus centrum semiovale compatible with acute/early subacute infarction. Additionally, there is a punctate focus of infarction within the left posterior temporal periventricular white matter. No associated hemorrhage or mass effect. Chronic lacunar infarction involving the left putamen and corona radiata. Mild chronic microvascular ischemic changes of white matter and volume loss of the brain. No extra-axial collection, hydrocephalus, effacement of basilar cisterns, or herniation. Susceptibility weighted sequences are motion degraded, no gross intracranial hemorrhage. Vascular: Normal flow voids. Skull and upper cervical spine: Normal marrow signal. Sinuses/Orbits: Mild diffuse paranasal sinus mucosal thickening. No abnormal signal of mastoid air cells. Orbits are unremarkable. Other: None. IMPRESSION: 1. Small areas of acute/early subacute infarction involving the right putamen, right mid caudate body, right frontal operculum, and the right middle frontal gyrus white matter. Additional punctate focus of acute/early subacute infarction within the left posterior temporal periventricular white matter. No associated hemorrhage or mass effect. 2. Chronic lacunar infarct of the left basal ganglia. 3. Mild chronic microvascular ischemic changes and volume loss of the brain. 4. Mild paranasal sinus mucosal thickening. Electronically Signed   By:  Mitzi Hansen M.D.   On: 04/26/2018 01:24    Ct Cerebral Perfusion W Contrast Result Date: 04/25/2018 CLINICAL DATA:  Follow up RIGHT M1 occlusion. LEFT-sided deficits, slurred speech. EXAM: CT PERFUSION BRAIN TECHNIQUE: Multiphase CT imaging of  the brain was performed following IV bolus contrast injection. Subsequent parametric perfusion maps were calculated using RAPID software. CONTRAST:  40mL ISOVUE-370 IOPAMIDOL (ISOVUE-370) INJECTION 76% COMPARISON:  CT angiogram head April 25, 2018 at 0435 hours. FINDINGS: Moderately motion degraded examination. CBF (<30%) Volume: 0mL Perfusion (Tmax>6.0s) volume: 52mL Mismatch Volume: 52mL ASPECTS on noncontrast CT Head: 9 at 0426 hours today. Infarct Core: 0 mL Infarction Location:RIGHT frontal parietal lobes; limited is assessment due to ipsilateral RIGHT MCA sampling, with chronic LEFT M1 occlusion. IMPRESSION: 1. RIGHT frontoparietal/MCA territory brain at risk without core infarct though, limited by patient motion and bilateral occluded MCA. Electronically Signed   By: Awilda Metro M.D.   On: 04/25/2018 05:12    Dg Chest Port 1 View Result Date: 04/26/2018 CLINICAL DATA:  Acute respiratory failure, hypoxia, history of stroke EXAM: PORTABLE CHEST 1 VIEW COMPARISON:  Portable chest x-ray of 04/25/2018 and two-view chest x-ray of 10/06/2012 FINDINGS: The endotracheal tube has been removed as is the NG tube. The lungs appear well aerated. Mediastinal and hilar contours are unremarkable and heart size is stable. No bony abnormality is seen. IMPRESSION: 1. Removal of endotracheal tube and NG tube. 2. Improved aeration.  No active lung disease. Electronically Signed   By: Dwyane Dee M.D.   On: 04/26/2018 08:26      12-lead ECG SR All prior EKG's in EPIC reviewed with no documented atrial fibrillation  Telemetry SR  Assessment and Plan:  1. Cryptogenic stroke The patient presents with cryptogenic stroke.  The patient has a TEE planned for  this AM.  Dr. Ladona Ridgel spoke at length with the patient about monitoring for afib with either a 30 day event monitor or an implantable loop recorder.  Risks, benefits, and alteratives to implantable loop recorder were discussed with the patient today.   At this time, the patient is very clear in his decision to proceed with implantable loop recorder.   Wound care was reviewed with the patient (keep incision clean and dry for 3 days).  Wound check scheduled  for the patient  Please call with questions.   Renee Norberto Sorenson, PA-C 04/28/2018   EP attending  Patient seen and examined.  Agree with the findings as noted above.  The patient has sustained a cryptogenic stroke and had marked improvement in his neurologic function.  He is undergone transesophageal echo demonstrating no obvious etiology for his stroke.  I discussed the treatment options with the patient in detail.  The risks, goals, benefits, and expectations of insertion of an implantable loop recorder were reviewed and he wishes to proceed.  Lewayne Bunting, MD

## 2018-04-28 NOTE — Anesthesia Preprocedure Evaluation (Signed)
Anesthesia Evaluation  Patient identified by MRN, date of birth, ID band Patient awake    Reviewed: Allergy & Precautions, NPO status , Patient's Chart, lab work & pertinent test results  Airway Mallampati: II  TM Distance: >3 FB Neck ROM: Full    Dental  (+) Teeth Intact   Pulmonary Current Smoker,    breath sounds clear to auscultation       Cardiovascular hypertension,  Rhythm:Regular Rate:Normal     Neuro/Psych    GI/Hepatic   Endo/Other    Renal/GU      Musculoskeletal   Abdominal   Peds  Hematology   Anesthesia Other Findings   Reproductive/Obstetrics                             Anesthesia Physical Anesthesia Plan  ASA: III  Anesthesia Plan: MAC   Post-op Pain Management:    Induction: Intravenous  PONV Risk Score and Plan: Ondansetron and Dexamethasone  Airway Management Planned:   Additional Equipment:   Intra-op Plan:   Post-operative Plan:   Informed Consent: I have reviewed the patients History and Physical, chart, labs and discussed the procedure including the risks, benefits and alternatives for the proposed anesthesia with the patient or authorized representative who has indicated his/her understanding and acceptance.     Plan Discussed with: Anesthesiologist and CRNA  Anesthesia Plan Comments:         Anesthesia Quick Evaluation

## 2018-04-29 ENCOUNTER — Encounter (HOSPITAL_COMMUNITY): Payer: Self-pay | Admitting: Cardiology

## 2018-04-29 LAB — PROTIME-INR
INR: 0.85
Prothrombin Time: 11.6 seconds (ref 11.4–15.2)

## 2018-04-29 MED ORDER — LIDOCAINE-EPINEPHRINE (PF) 2 %-1:200000 IJ SOLN
20.0000 mL | Freq: Once | INTRAMUSCULAR | Status: DC
  Filled 2018-04-29: qty 20

## 2018-04-29 MED ORDER — TRANEXAMIC ACID 1000 MG/10ML IV SOLN
500.0000 mg | Freq: Once | INTRAVENOUS | Status: AC
  Administered 2018-04-29: 500 mg via TOPICAL
  Filled 2018-04-29: qty 10

## 2018-04-29 NOTE — ED Provider Notes (Signed)
[facility] EMERGENCY DEPARTMENT Provider Note   CSN: [id number] Arrival date & time: 04/28/18  2214     History   Chief Complaint Chief Complaint  Patient presents with  . Wound Check    HPI Manuel Harrison is a 47 y.o. male.  Patient presents with bleeding from the site of his loop recorder implantation that occurred today.  He was just discharged from the hospital after being admitted with a right MCA stroke that required embolectomy.  He had a loop recorder placed prior to discharge which she states has been bleeding since he went home.  He does take aspirin and Brilinta.  Denies any dizziness or lightheadedness.  Has mild left arm weakness since the stroke which is stable.  Denies any chest pain or shortness of breath.  The history is provided by the patient.  Wound Check  Pertinent negatives include no headaches and no shortness of breath.    Past Medical History:  Diagnosis Date  . 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 Arthritis    "left knee" (04/26/2018)  . High cholesterol   . History of gout   . Hypertension   . Stroke [facility]) 09/2012   "right side is weaker since" (10//08/2017)    Patient Active Problem List   Diagnosis Date Noted  . Acute ischemic right MCA stroke (HCC)   . Acute right arterial ischemic stroke, middle cerebral artery (MCA) (HCC) 04/25/2018  . 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 Acute respiratory failure with hypoxia (HCC)   . Osteoarthritis of left knee 11/24/2015  . Essential hypertension 11/24/2015  . Dry cough 11/24/2015  . Seasonal allergies 11/24/2015  . Dyslipidemia 01/30/2013  . Acute ischemic stroke (HCC) 10/07/2012  . Tobacco abuse disorder 10/06/2012  . History of cocaine use 10/06/2012    Past Surgical History:  Procedure Laterality Date  . IR CT HEAD LTD  04/25/2018  . IR INTRA CRAN STENT  04/25/2018  . IR PERCUTANEOUS ART  THROMBECTOMY/INFUSION INTRACRANIAL INC DIAG ANGIO  04/25/2018  . LOOP RECORDER INSERTION N/A 04/28/2018   Procedure: LOOP RECORDER INSERTION;  Surgeon: Marinus Maw, MD;  Location: [facility] INVASIVE CV LAB;  Service: Cardiovascular;  Laterality: N/A;  . RADIOLOGY WITH ANESTHESIA N/A 04/25/2018   Procedure: RADIOLOGY WITH ANESTHESIA;  Surgeon: Julieanne Cotton, MD;  Location: [facility] OR;  Service: Radiology;  Laterality: N/A;        Home Medications    Prior to Admission medications   Medication Sig Start Date End Date Taking? Authorizing Provider  amLODipine (NORVASC) 5 MG tablet Take 1 tablet (5 mg total) by mouth daily. 04/28/18  Yes Nyanor, Karie Soda, NP  aspirin 81 MG chewable tablet Chew 1 tablet (81 mg total) by mouth daily. 04/28/18  Yes Nyanor, Karie Soda, NP  rosuvastatin (CRESTOR) 20 MG tablet Take 1 tablet (20 mg total) by mouth daily at 6 PM. 04/28/18  Yes Nyanor, Karie Soda, NP  ticagrelor (BRILINTA) 90 MG TABS tablet Take 1 tablet (90 mg total) by mouth 2 (two) times daily. 04/28/18 05/28/18 Yes Nyanor, Karie Soda, NP  varenicline (CHANTIX) 1 MG tablet Take 1 tablet (1 mg total) by mouth 2 (two) times daily. 05/03/18 10/04/18 Yes Nyanor, Karie Soda, NP  varenicline (CHANTIX) 0.5 MG tablet Take 1 tablet (0.5 mg total) by mouth 2 (two) times daily. 04/29/18   Nyanor, Karie Soda, NP    Family History History reviewed. No pertinent family  history.  Social History Social History   Tobacco Use  . Smoking status: Current Every Day Smoker    Packs/day: 2.00    Years: 32.00    Pack years: 64.00    Types: Cigarettes  . Smokeless tobacco: Never Used  Substance Use Topics  . Alcohol use: Yes    Alcohol/week: 47.0 standard drinks    Types: 47 Cans of beer per week    Comment: 04/26/2018 "2- 40 ounce cans/day"  . Drug use: Yes    Types: Marijuana    Comment: 04/26/2018 "nothing regular; might smoke weekly, if that"     Allergies   Aleve [naproxen sodium]   Review of Systems Review of Systems    Constitutional: Negative for activity change, appetite change and fever.  Respiratory: Negative for cough, chest tightness and shortness of breath.   Genitourinary: Negative for dysuria and hematuria.  Skin: Positive for wound.  Neurological: Positive for weakness. Negative for light-headedness and headaches.  Hematological: Bruises/bleeds easily.    all other systems are negative except as noted in the HPI and PMH.    Physical Exam Updated Vital Signs BP (!) 150/93 (BP Location: Right Arm)   Pulse 68   Temp 99 F (37.2 C) (Oral)   Resp 18   SpO2 100%   Physical Exam  Constitutional: He is oriented to person, place, and time. He appears well-developed and well-nourished. No distress.  HENT:  Head: Normocephalic and atraumatic.  Mouth/Throat: Oropharynx is clear and moist. No oropharyngeal exudate.  Eyes: Pupils are equal, round, and reactive to light. Conjunctivae and EOM are normal.  Neck: Normal range of motion. Neck supple.  No meningismus.  Cardiovascular: Normal rate, regular rhythm, normal heart sounds and intact distal pulses.  No murmur heard. Pulmonary/Chest: Effort normal and breath sounds normal. No respiratory distress.  Loop recorder site in left chest with slight oozing.  Steri-Strips in place with saturated dressing  Abdominal: Soft. There is no tenderness. There is no rebound and no guarding.  Musculoskeletal: Normal range of motion. He exhibits no edema or tenderness.  Neurological: He is alert and oriented to person, place, and time. No cranial nerve deficit. He exhibits normal muscle tone. Coordination normal.  CN 2-12 intact.  L arm weakness stable per patient. Good strength on R side.  Skin: Skin is warm.  Psychiatric: He has a normal mood and affect. His behavior is normal.  Nursing note and vitals reviewed.    ED Treatments / Results  Labs (all labs ordered are listed, but only abnormal results are displayed) Labs Reviewed  CBC - Abnormal;  Notable for the following components:      Result Value   RBC 4.00 (*)    Hemoglobin 12.4 (*)    HCT 36.2 (*)    All other components within normal limits  BASIC METABOLIC PANEL - Abnormal; Notable for the following components:   Glucose, Bld 105 (*)    All other components within normal limits  PROTIME-INR    EKG None  Radiology No results found.  Procedures .Marland KitchenLaceration Repair Date/Time: 04/29/2018 7:06 AM Performed by: Glynn Octave, MD Authorized by: Glynn Octave, MD   Consent:    Consent obtained:  Emergent situation and verbal   Consent given by:  Patient   Risks discussed:  Infection, pain, poor wound healing and need for additional repair   Alternatives discussed:  No treatment Anesthesia (see MAR for exact dosages):    Anesthesia method:  Local infiltration   Local anesthetic:  Lidocaine 1% WITH epi Laceration details:    Location:  Trunk   Trunk location:  L chest   Length (cm):  1 Repair type:    Repair type:  Simple Pre-procedure details:    Preparation:  Patient was prepped and draped in usual sterile fashion Exploration:    Hemostasis achieved with:  Epinephrine and direct pressure   Wound exploration: wound explored through full range of motion and entire depth of wound probed and visualized     Wound extent: no nerve damage noted   Treatment:    Area cleansed with:  Betadine   Amount of cleaning:  Standard   Irrigation solution:  Sterile saline   Irrigation method:  Syringe   Visualized foreign bodies/material removed: no   Skin repair:    Repair method:  Steri-Strips   Number of Steri-Strips:  3 Approximation:    Approximation:  Close Post-procedure details:    Dressing:  Sterile dressing   Patient tolerance of procedure:  Tolerated well, no immediate complications   (including critical care time)  Medications Ordered in ED Medications  lidocaine-EPINEPHrine (XYLOCAINE W/EPI) 2 %-1:200000 (PF) injection 20 mL (has no administration  in time range)  tranexamic acid (CYKLOKAPRON) injection 500 mg (500 mg Topical Given 04/29/18 0240)     Initial Impression / Assessment and Plan / ED Course  I have reviewed the triage vital signs and the nursing notes.  Pertinent labs & imaging results that were available during my care of the patient were reviewed by me and considered in my medical decision making (see chart for details).    Patient with bleeding from loop recorder site. No trauma. On brilinta. Discharged today after admission for R MCA stroke with thrombectomy.   Oozing from loop recorder site.  Vitals stable. Hemoglobin stable.   With oozing from loop recorder site.  Steri strips removed. TXA applied. Lidocaine with epi injected.  New steri strips placed with resolution of bleeding.    After observation in the ED, bleeding has subsided.  Patient given dressing.  Follow-up with his specialist as scheduled.  Final Clinical Impressions(s) / ED Diagnoses   Final diagnoses:  Visit for wound check    ED Discharge Orders    None       Adahlia Stembridge, Jeannett Senior, MD 04/29/18 [phone]

## 2018-04-29 NOTE — Discharge Instructions (Addendum)
Follow-up as scheduled with your cardiologist to evaluate this wound.  Return to the ED if you develop new or worsening symptoms.  If bleeding recurs again hold pressure for 30 minutes and if bleeding still persists come back to the ED at this point or if you develop any other concerns.

## 2018-04-30 LAB — CULTURE, BLOOD (ROUTINE X 2)
Culture: NO GROWTH
Culture: NO GROWTH
Special Requests: ADEQUATE
Special Requests: ADEQUATE

## 2018-05-04 ENCOUNTER — Telehealth: Payer: Self-pay

## 2018-05-04 NOTE — Telephone Encounter (Signed)
Tried to verify appointment and they keep hanging up the phone on me.

## 2018-05-05 ENCOUNTER — Encounter: Payer: Self-pay | Admitting: Family Medicine

## 2018-05-05 ENCOUNTER — Ambulatory Visit (INDEPENDENT_AMBULATORY_CARE_PROVIDER_SITE_OTHER): Payer: Self-pay | Admitting: Family Medicine

## 2018-05-05 VITALS — BP 140/86 | HR 88 | Temp 98.3°F | Ht 68.0 in | Wt 156.0 lb

## 2018-05-05 DIAGNOSIS — M109 Gout, unspecified: Secondary | ICD-10-CM

## 2018-05-05 DIAGNOSIS — Z09 Encounter for follow-up examination after completed treatment for conditions other than malignant neoplasm: Secondary | ICD-10-CM

## 2018-05-05 DIAGNOSIS — M1712 Unilateral primary osteoarthritis, left knee: Secondary | ICD-10-CM

## 2018-05-05 DIAGNOSIS — Z7689 Persons encountering health services in other specified circumstances: Secondary | ICD-10-CM

## 2018-05-05 DIAGNOSIS — Z95818 Presence of other cardiac implants and grafts: Secondary | ICD-10-CM

## 2018-05-05 DIAGNOSIS — Z720 Tobacco use: Secondary | ICD-10-CM

## 2018-05-05 MED ORDER — COLCHICINE 0.6 MG PO TABS: ORAL_TABLET | ORAL | 1 refills | Status: DC

## 2018-05-05 MED ORDER — IBUPROFEN 800 MG PO TABS: 800.0000 mg | ORAL_TABLET | Freq: Three times a day (TID) | ORAL | 0 refills | Status: DC | PRN

## 2018-05-05 NOTE — Progress Notes (Signed)
Hospital Follow Up--Establish Care  Subjective:    Patient ID: Manuel Harrison, male    DOB: October 22, 1970, 47 y.o.   MRN: [id number]  HPI Mr. Cohenour is a 47 year old male with a past medical history of Stroke, Hypertension, Gout, Hyperlipidemia, and Arthritis.   Patient is s/p: stoke 2014 in and most recently, 04/25/2018, which he continues to have residual left extremity weakness. He his chronic left knee pain, which he takes Ibuprofen for relief. He has history of fluid removal from knee. He continues to smoke 4 cigarettes daily. He has a Rx an Chantix, but cannot afford medication.   Loop Recorder Implantation left chest on 04/28/2018.  Stent placement in head on 04/25/2018.   He is accompanied by his wife and uses cane for assistance with ambulation.    Review of Systems  Constitutional: Negative.   HENT: Negative.   Respiratory: Negative.   Cardiovascular: Negative.   Gastrointestinal: Negative.   Musculoskeletal: Positive for arthralgias (left knee).  Skin: Negative.   Neurological: Negative.   Psychiatric/Behavioral: Negative.     Objective:   Physical Exam  Constitutional: He is oriented to person, place, and time. He appears well-developed and well-nourished.  HENT:  Head: Normocephalic and atraumatic.  Neck: Normal range of motion. Neck supple.  Cardiovascular: Normal rate, regular rhythm and normal heart sounds.  Pulmonary/Chest: Effort normal and breath sounds normal.  Abdominal: Soft. Bowel sounds are normal.    Musculoskeletal: Normal range of motion.  Neurological: He is alert and oriented to person, place, and time.  Skin: Skin is warm and dry.  Psychiatric: He has a normal mood and affect. His behavior is normal. Judgment and thought content normal.  Nursing note and vitals reviewed.  Assessment & Plan:   1. Encounter to establish care - Urinalysis Dipstick  2. Hospital discharge follow-up  3. Osteoarthritis of left knee, unspecified osteoarthritis  type We will initiate Motrin as prescribed.  - ibuprofen (ADVIL,MOTRIN) 800 MG tablet; Take 1 tablet (800 mg total) by mouth every 8 (eight) hours as needed.  Dispense: 30 tablet; Refill: 0  4. Acute gout involving toe of left foot, unspecified cause Initiated Colchicine today.  - colchicine 0.6 MG tablet; Take 2 times a day as needed for gout flare  Dispense: 60 tablet; Refill: 1 - Uric Acid  5. Tobacco abuse disorder Continues to smoke.   6. Status post placement of implantable loop recorder Insertion on 04/25/2018. Surgical site is CD& I.   7. Follow up He will follow up in 2 months.   Meds ordered this encounter  Medications  . ibuprofen (ADVIL,MOTRIN) 800 MG tablet    Sig: Take 1 tablet (800 mg total) by mouth every 8 (eight) hours as needed.    Dispense:  30 tablet    Refill:  0  . colchicine 0.6 MG tablet    Sig: Take 2 times a day as needed for gout flare    Dispense:  60 tablet    Refill:  1    Raliegh Ip,  MSN, FNP-C [facility] [address] [place], [place] [address] [phone]

## 2018-05-05 NOTE — Patient Instructions (Addendum)
Gout Gout is painful swelling that can happen in some of your joints. Gout is a type of arthritis. This condition is caused by having too much uric acid in your body. Uric acid is a chemical that is made when your body breaks down substances called purines. If your body has too much uric acid, sharp crystals can form and build up in your joints. This causes pain and swelling. Gout attacks can happen quickly and be very painful (acute gout). Over time, the attacks can affect more joints and happen more often (chronic gout). Follow these instructions at home: During a Gout Attack  If directed, put ice on the painful area: ? Put ice in a plastic bag. ? Place a towel between your skin and the bag. ? Leave the ice on for 20 minutes, 2-3 times a day.  Rest the joint as much as possible. If the joint is in your leg, you may be given crutches to use.  Raise (elevate) the painful joint above the level of your heart as often as you can.  Drink enough fluids to keep your pee (urine) clear or pale yellow.  Take over-the-counter and prescription medicines only as told by your doctor.  Do not drive or use heavy machinery while taking prescription pain medicine.  Follow instructions from your doctor about what you can or cannot eat and drink.  Return to your normal activities as told by your doctor. Ask your doctor what activities are safe for you. Avoiding Future Gout Attacks  Follow a low-purine diet as told by a specialist (dietitian) or your doctor. Avoid foods and drinks that have a lot of purines, such as: ? Liver. ? Kidney. ? Anchovies. ? Asparagus. ? Herring. ? Mushrooms ? Mussels. ? Beer.  Limit alcohol intake to no more than 1 drink a day for nonpregnant women and 2 drinks a day for men. One drink equals 12 oz of beer, 5 oz of wine, or 1 oz of hard liquor.  Stay at a healthy weight or lose weight if you are overweight. If you want to lose weight, talk with your doctor. It is  important that you do not lose weight too fast.  Start or continue an exercise plan as told by your doctor.  Drink enough fluids to keep your pee clear or pale yellow.  Take over-the-counter and prescription medicines only as told by your doctor.  Keep all follow-up visits as told by your doctor. This is important. Contact a doctor if:  You have another gout attack.  You still have symptoms of a gout attack after10 days of treatment.  You have problems (side effects) because of your medicines.  You have chills or a fever.  You have burning pain when you pee (urinate).  You have pain in your lower back or belly. Get help right away if:  You have very bad pain.  Your pain cannot be controlled.  You cannot pee. This information is not intended to replace advice given to you by your health care provider. Make sure you discuss any questions you have with your health care provider. Document Released: 04/20/2008 Document Revised: 12/18/2015 Document Reviewed: 04/24/2015 Elsevier Interactive Patient Education  2018 [employer].     Low-Purine Diet Purines are compounds that affect the level of uric acid in your body. A low-purine diet is a diet that is low in purines. Eating a low-purine diet can prevent the level of uric acid in your body from getting too high and causing  gout or kidney stones or both. What do I need to know about this diet?  Choose low-purine foods. Examples of low-purine foods are listed in the next section.  Drink plenty of fluids, especially water. Fluids can help remove uric acid from your body. Try to drink 8-16 cups (1.9-3.8 L) a day.  Limit foods high in fat, especially saturated fat, as fat makes it harder for the body to get rid of uric acid. Foods high in saturated fat include pizza, cheese, ice cream, whole milk, fried foods, and gravies. Choose foods that are lower in fat and lean sources of protein. Use olive oil when cooking as it contains  healthy fats that are not high in saturated fat.  Limit alcohol. Alcohol interferes with the elimination of uric acid from your body. If you are having a gout attack, avoid all alcohol.  Keep in mind that different people's bodies react differently to different foods. You will probably learn over time which foods do or do not affect you. If you discover that a food tends to cause your gout to flare up, avoid eating that food. You can more freely enjoy foods that do not cause problems. If you have any questions about a food item, talk to your dietitian or health care provider. Which foods are low, moderate, and high in purines? The following is a list of foods that are low, moderate, and high in purines. You can eat any amount of the foods that are low in purines. You may be able to have small amounts of foods that are moderate in purines. Ask your health care provider how much of a food moderate in purines you can have. Avoid foods high in purines. Grains  Foods low in purines: Enriched white bread, pasta, rice, cake, cornbread, popcorn.  Foods moderate in purines: Whole-grain breads and cereals, wheat germ, bran, oatmeal. Uncooked oatmeal. Dry wheat bran or wheat germ.  Foods high in purines: Pancakes, [place] toast, biscuits, muffins. Vegetables  Foods low in purines: All vegetables, except those that are moderate in purines.  Foods moderate in purines: Asparagus, cauliflower, spinach, mushrooms, green peas. Fruits  All fruits are low in purines. Meats and other Protein Foods  Foods low in purines: Eggs, nuts, peanut butter.  Foods moderate in purines: 80-90% lean beef, lamb, veal, pork, poultry, fish, eggs, peanut butter, nuts. Crab, lobster, oysters, and shrimp. Cooked dried beans, peas, and lentils.  Foods high in purines: Anchovies, sardines, herring, mussels, tuna, codfish, scallops, trout, and haddock. Tomasa Blase. Organ meats (such as liver or kidney). Tripe. Game meat. Goose.  Sweetbreads. Dairy  All dairy foods are low in purines. Low-fat and fat-free dairy products are best because they are low in saturated fat. Beverages  Drinks low in purines: Water, carbonated beverages, tea, coffee, cocoa.  Drinks moderate in purines: Soft drinks and other drinks sweetened with high-fructose corn syrup. Juices. To find whether a food or drink is sweetened with high-fructose corn syrup, look at the ingredients list.  Drinks high in purines: Alcoholic beverages (such as beer). Condiments  Foods low in purines: Salt, herbs, olives, pickles, relishes, vinegar.  Foods moderate in purines: Butter, margarine, oils, mayonnaise. Fats and Oils  Foods low in purines: All types, except gravies and sauces made with meat.  Foods high in purines: Gravies and sauces made with meat. Other Foods  Foods low in purines: Sugars, sweets, gelatin. Cake. Soups made without meat.  Foods moderate in purines: Meat-based or fish-based soups, broths, or bouillons. Foods and  drinks sweetened with high-fructose corn syrup.  Foods high in purines: High-fat desserts (such as ice cream, cookies, cakes, pies, doughnuts, and chocolate). Contact your dietitian for more information on foods that are not listed here. This information is not intended to replace advice given to you by your health care provider. Make sure you discuss any questions you have with your health care provider. Document Released: 11/06/2010 Document Revised: 12/18/2015 Document Reviewed: 06/18/2013 Elsevier Interactive Patient Education  2017 Else[employer] Inc.  Ibuprofen tablets and capsules What is this medicine? IBUPROFEN (eye BYOO proe fen) is a non-steroidal anti-inflammatory drug (NSAID). It is used for dental pain, fever, headaches or migraines, osteoarthritis, rheumatoid arthritis, or painful monthly periods. It can also relieve minor aches and pains caused by a cold, flu, or sore throat. This medicine may be used for other  purposes; ask your health care provider or pharmacist if you have questions. COMMON BRAND NAME(S): Advil, Advil Junior Strength, Advil Migraine, Genpril, Ibren, IBU, Midol, Midol Cramps and Body Aches, Motrin, Motrin IB, Motrin Junior Strength, Motrin Migraine Pain, Samson-8, Toxicology Saliva Collection What should I tell my health care provider before I take this medicine? They need to know if you have any of these conditions: -asthma -cigarette smoker -drink more than 3 alcohol containing drinks a day -heart disease or circulation problems such as heart failure or leg edema (fluid retention) -high blood pressure -kidney disease -liver disease -stomach bleeding or ulcers -an unusual or allergic reaction to ibuprofen, aspirin, other NSAIDS, other medicines, foods, dyes, or preservatives -pregnant or trying to get pregnant -breast-feeding How should I use this medicine? Take this medicine by mouth with a glass of water. Follow the directions on the prescription label. Take this medicine with food if your stomach gets upset. Try to not lie down for at least 10 minutes after you take the medicine. Take your medicine at regular intervals. Do not take your medicine more often than directed. A special MedGuide will be given to you by the pharmacist with each prescription and refill. Be sure to read this information carefully each time. Talk to your pediatrician regarding the use of this medicine in children. Special care may be needed. Overdosage: If you think you have taken too much of this medicine contact a poison control center or emergency room at once. NOTE: This medicine is only for you. Do not share this medicine with others. What if I miss a dose? If you miss a dose, take it as soon as you can. If it is almost time for your next dose, take only that dose. Do not take double or extra doses. What may interact with this medicine? Do not take this medicine with any of the following  medications: -cidofovir -ketorolac -methotrexate -pemetrexed This medicine may also interact with the following medications: -alcohol -aspirin -diuretics -lithium -other drugs for inflammation like prednisone -warfarin This list may not describe all possible interactions. Give your health care provider a list of all the medicines, herbs, non-prescription drugs, or dietary supplements you use. Also tell them if you smoke, drink alcohol, or use illegal drugs. Some items may interact with your medicine. What should I watch for while using this medicine? Tell your doctor or healthcare professional if your symptoms do not start to get better or if they get worse. This medicine does not prevent heart attack or stroke. In fact, this medicine may increase the chance of a heart attack or stroke. The chance may increase with longer use of this medicine and  in people who have heart disease. If you take aspirin to prevent heart attack or stroke, talk with your doctor or health care professional. Do not take other medicines that contain aspirin, ibuprofen, or naproxen with this medicine. Side effects such as stomach upset, nausea, or ulcers may be more likely to occur. Many medicines available without a prescription should not be taken with this medicine. This medicine can cause ulcers and bleeding in the stomach and intestines at any time during treatment. Ulcers and bleeding can happen without warning symptoms and can cause death. To reduce your risk, do not smoke cigarettes or drink alcohol while you are taking this medicine. You may get drowsy or dizzy. Do not drive, use machinery, or do anything that needs mental alertness until you know how this medicine affects you. Do not stand or sit up quickly, especially if you are an older patient. This reduces the risk of dizzy or fainting spells. This medicine can cause you to bleed more easily. Try to avoid damage to your teeth and gums when you brush or floss  your teeth. This medicine may be used to treat migraines. If you take migraine medicines for 10 or more days a month, your migraines may get worse. Keep a diary of headache days and medicine use. Contact your healthcare professional if your migraine attacks occur more frequently. What side effects may I notice from receiving this medicine? Side effects that you should report to your doctor or health care professional as soon as possible: -allergic reactions like skin rash, itching or hives, swelling of the face, lips, or tongue -severe stomach pain -signs and symptoms of bleeding such as bloody or black, tarry stools; red or dark-brown urine; spitting up blood or brown material that looks like coffee grounds; red spots on the skin; unusual bruising or bleeding from the eye, gums, or nose -signs and symptoms of a blood clot such as changes in vision; chest pain; severe, sudden headache; trouble speaking; sudden numbness or weakness of the face, arm, or leg -unexplained weight gain or swelling -unusually weak or tired -yellowing of eyes or skin Side effects that usually do not require medical attention (report to your doctor or health care professional if they continue or are bothersome): -bruising -diarrhea -dizziness, drowsiness -headache -nausea, vomiting This list may not describe all possible side effects. Call your doctor for medical advice about side effects. You may report side effects to FDA at 1-800-FDA-1088. Where should I keep my medicine? Keep out of the reach of children. Store at room temperature between 15 and 30 degrees C (59 and 86 degrees F). Keep container tightly closed. Throw away any unused medicine after the expiration date. NOTE: This sheet is a summary. It may not cover all possible information. If you have questions about this medicine, talk to your doctor, pharmacist, or health care provider.  2018 Elsevier/Gold Standard (2013-03-13 10:48:02)

## 2018-05-06 LAB — URIC ACID: Uric Acid: 6.2 mg/dL (ref 3.7–8.6)

## 2018-05-11 ENCOUNTER — Ambulatory Visit (INDEPENDENT_AMBULATORY_CARE_PROVIDER_SITE_OTHER): Payer: Self-pay | Admitting: *Deleted

## 2018-05-11 DIAGNOSIS — I63511 Cerebral infarction due to unspecified occlusion or stenosis of right middle cerebral artery: Secondary | ICD-10-CM

## 2018-05-16 ENCOUNTER — Other Ambulatory Visit (HOSPITAL_COMMUNITY): Payer: Self-pay | Admitting: Interventional Radiology

## 2018-05-16 DIAGNOSIS — I639 Cerebral infarction, unspecified: Secondary | ICD-10-CM

## 2018-05-17 LAB — CUP PACEART INCLINIC DEVICE CHECK
Date Time Interrogation Session: 20191017192913
Implantable Pulse Generator Implant Date: 20191004

## 2018-05-17 NOTE — Addendum Note (Signed)
Addended by: Sebastian Ache on: 05/17/2018 02:58 PM   Modules accepted: Level of Service

## 2018-05-17 NOTE — Progress Notes (Signed)
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 Incision edges approximated. Wound well healed without redness and edema. Normal device function. Battery status: GOOD. R-waves 0.36mV. 0 symptom episodes, 0 tachy episodes, 0 pause episodes, 0 brady episodes. 0 AF episodes (0% burden). Patient education completed including wound care, remote monitoring, and billing. Monthly summary reports and ROV with GT PRN.

## 2018-05-23 ENCOUNTER — Telehealth: Payer: Self-pay | Admitting: *Deleted

## 2018-05-23 NOTE — Telephone Encounter (Signed)
LVMOM requesting manual transmission to review 7 tachy episodes and to inquire about symptoms associated with episodes. 1 available episode appears SVT, duration 1 minute 9 seconds, Avg V rate 188 bpm. Gave Device Clinic phone number to call back.

## 2018-05-29 NOTE — Telephone Encounter (Signed)
Attempted to reach patient. Family member answered the phone--gave direct number requesting return call from patient as no DPR on file.

## 2018-05-30 ENCOUNTER — Ambulatory Visit (HOSPITAL_COMMUNITY): Admission: RE | Admit: 2018-05-30 | Payer: Self-pay | Source: Ambulatory Visit

## 2018-05-31 ENCOUNTER — Ambulatory Visit (INDEPENDENT_AMBULATORY_CARE_PROVIDER_SITE_OTHER): Payer: Self-pay | Admitting: *Deleted

## 2018-05-31 ENCOUNTER — Ambulatory Visit (HOSPITAL_COMMUNITY): Payer: Self-pay

## 2018-05-31 DIAGNOSIS — I63511 Cerebral infarction due to unspecified occlusion or stenosis of right middle cerebral artery: Secondary | ICD-10-CM

## 2018-05-31 MED FILL — AMLODIPINE BESYLATE 5 MG TA: 5 | 30 days supply | Qty: 30 | Fill #1

## 2018-05-31 NOTE — Progress Notes (Signed)
Carelink Summary Report / Loop Recorder 

## 2018-06-02 NOTE — Telephone Encounter (Signed)
LMOVM requesting manual Carelink transmission for review. Gave DC phone call for questions/concerns.

## 2018-06-09 NOTE — Telephone Encounter (Signed)
LVMOM requesting manual transmission for review. Gave device clinic phone number to return phone call.

## 2018-06-14 NOTE — Telephone Encounter (Signed)
Attempted to reach patient at all numbers listed in chart. LMOVM for patient requesting manual Carelink transmission for review. Gave DC phone number for questions/concerns.

## 2018-06-21 ENCOUNTER — Encounter: Payer: Self-pay | Admitting: *Deleted

## 2018-06-21 NOTE — Progress Notes (Unsigned)
Letter sent in regards to sending a manual transmission. Monitor instructions were attached.

## 2018-06-30 ENCOUNTER — Other Ambulatory Visit: Payer: Self-pay

## 2018-06-30 ENCOUNTER — Telehealth: Payer: Self-pay

## 2018-06-30 ENCOUNTER — Other Ambulatory Visit: Payer: Self-pay | Admitting: Family Medicine

## 2018-06-30 MED ORDER — TICAGRELOR 90 MG PO TABS: 90.0000 mg | ORAL_TABLET | Freq: Two times a day (BID) | ORAL | 3 refills | Status: AC

## 2018-07-03 ENCOUNTER — Ambulatory Visit (INDEPENDENT_AMBULATORY_CARE_PROVIDER_SITE_OTHER): Payer: Self-pay

## 2018-07-03 DIAGNOSIS — I63511 Cerebral infarction due to unspecified occlusion or stenosis of right middle cerebral artery: Secondary | ICD-10-CM

## 2018-07-03 MED FILL — !COLCRYS 0.6 MG TABLET: 0.6 MG | 7 days supply | Qty: 15 | Fill #0

## 2018-07-03 MED FILL — BRILINTA 90 MG TABS: 90 | 30 days supply | Qty: 60 | Fill #0

## 2018-07-04 ENCOUNTER — Encounter: Payer: Self-pay | Admitting: *Deleted

## 2018-07-04 NOTE — Telephone Encounter (Signed)
Multiple attempts made to reach patient regarding need for manual Carelink transmission and to discuss symptoms with tachy episodes. Unable to reach patient. Carelink monitor has now been disconnected since 06/14/18.  Letter with manual transmission instructions was mailed on 06/21/18.  Certified letter mailed 07/04/18.

## 2018-07-05 ENCOUNTER — Ambulatory Visit (INDEPENDENT_AMBULATORY_CARE_PROVIDER_SITE_OTHER): Payer: Self-pay | Admitting: Family Medicine

## 2018-07-05 ENCOUNTER — Encounter: Payer: Self-pay | Admitting: Cardiology

## 2018-07-05 ENCOUNTER — Encounter: Payer: Self-pay | Admitting: Family Medicine

## 2018-07-05 VITALS — BP 132/86 | HR 68 | Temp 98.0°F | Ht 68.0 in | Wt 162.0 lb

## 2018-07-05 DIAGNOSIS — E785 Hyperlipidemia, unspecified: Secondary | ICD-10-CM

## 2018-07-05 DIAGNOSIS — M1712 Unilateral primary osteoarthritis, left knee: Secondary | ICD-10-CM

## 2018-07-05 DIAGNOSIS — I10 Essential (primary) hypertension: Secondary | ICD-10-CM

## 2018-07-05 DIAGNOSIS — Z09 Encounter for follow-up examination after completed treatment for conditions other than malignant neoplasm: Secondary | ICD-10-CM

## 2018-07-05 LAB — POCT URINALYSIS DIP (MANUAL ENTRY)
Bilirubin, UA: NEGATIVE
Blood, UA: NEGATIVE
Glucose, UA: NEGATIVE mg/dL
Ketones, POC UA: NEGATIVE mg/dL
Leukocytes, UA: NEGATIVE
Nitrite, UA: NEGATIVE
Protein Ur, POC: NEGATIVE mg/dL
Spec Grav, UA: 1.01 (ref 1.010–1.025)
Urobilinogen, UA: 0.2 E.U./dL
pH, UA: 7 (ref 5.0–8.0)

## 2018-07-05 MED ORDER — ROSUVASTATIN CALCIUM 20 MG PO TABS: 20.0000 mg | ORAL_TABLET | Freq: Every day | ORAL | 6 refills | Status: DC

## 2018-07-05 NOTE — Progress Notes (Signed)
Follow Up  Subjective:    Patient ID: Manuel Harrison, male    DOB: 08-Nov-1970, 47 y.o.   MRN: [id number]  Chief Complaint  Patient presents with  . Follow-up    Chronic condition     HPI  Manuel Harrison is a 47 year old male with a past medical history of Stroke, Hypertension, Gout, Hyperlipidemia, and Arthritis. He is here today for assessment and follow up of chronic diseases.   Current Status: Since his last office visit, he is doing well with no complaints. He was recently incarcerated and has just recently restarted taking his medications. He continues to smoke 6-7 cigarettes daily. No gouty episodes. He denies visual changes, chest pain, cough, shortness of breath, heart palpitations, and falls. He has occasionally headaches and dizziness with position changes. Denies severe headaches, confusion, seizures, double vision, and blurred vision, nausea and vomiting.  He denies fevers, chills, fatigue, recent infections, weight loss, and night sweats. He has not had any vand falls. No chest pain, heart palpitations, cough and shortness of breath reported. No reports of GI problems such as diarrhea, and constipation. He has no reports of blood in stools, dysuria and hematuria. No depression or anxiety reported. He denies pain today.   Review of Systems  Constitutional: Negative.   HENT: Negative.   Eyes: Negative.   Respiratory: Negative.   Cardiovascular: Negative.   Gastrointestinal: Negative.   Endocrine: Negative.   Genitourinary: Negative.   Musculoskeletal: Negative.   Skin: Negative.   Allergic/Immunologic: Negative.   Neurological: Negative.   Hematological: Negative.   Psychiatric/Behavioral: Negative.    Objective:   Physical Exam  Constitutional: He is oriented to person, place, and time. He appears well-developed and well-nourished.  HENT:  Head: Normocephalic and atraumatic.  Right Ear: External ear normal.  Eyes: Pupils are equal, round, and reactive to light.  Conjunctivae and EOM are normal.  Neck: Normal range of motion. Neck supple.  Cardiovascular: Normal rate, regular rhythm, normal heart sounds and intact distal pulses.  Pulmonary/Chest: Effort normal and breath sounds normal.  Abdominal: Soft. Bowel sounds are normal.  Musculoskeletal: Normal range of motion.  Neurological: He is alert and oriented to person, place, and time.  Skin: Skin is warm and dry.  Psychiatric: He has a normal mood and affect. His behavior is normal. Judgment and thought content normal.  Nursing note and vitals reviewed.   Assessment & Plan:   1. Hypertension Blood pressure is stable at 132/86 today. Continue Amlodipine and Brilinta. He will continue to decrease high sodium intake, excessive alcohol intake, increase potassium intake, smoking cessation, and increase physical activity of at least 30 minutes of cardio activity daily. He will continue to follow Heart Healthy or DASH diet.  2. Osteoarthritis of left knee, unspecified osteoarthritis type Continue Motrin as needed for pain.   3. Dyslipidemia - rosuvastatin (CRESTOR) 20 MG tablet; Take 1 tablet (20 mg total) by mouth daily at 6 PM.  Dispense: 30 tablet; Refill: 6  4. Follow up He will follow up in 6 months.  - POCT urinalysis dipstick  Meds ordered this encounter  Medications  . rosuvastatin (CRESTOR) 20 MG tablet    Sig: Take 1 tablet (20 mg total) by mouth daily at 6 PM.    Dispense:  30 tablet    Refill:  6    Raliegh IpNatalie Shirelle Tootle,  MSN, [facility] LLCFNP-C Patient [facility] [address]  [place], [place] [id number] [phone]

## 2018-07-06 NOTE — Telephone Encounter (Signed)
Manual transmission received 07/05/18. 18 "tachy" episodes printed and placed in Dr. Valarie Conesaylor's LINQ folder for review.  Encounter closed as patient has not returned multiple calls to discuss any symptoms during episode.

## 2018-07-07 NOTE — Progress Notes (Signed)
Carelink Summary Report / Loop Recorder 

## 2018-07-10 ENCOUNTER — Telehealth: Payer: Self-pay | Admitting: Cardiology

## 2018-07-10 NOTE — Telephone Encounter (Signed)
Patient called and stated that he received the certified letter. He sent a manual transmission on 07-05-2018. He is waiting for a call back to discuss the transmission from 07-05-2018.

## 2018-07-10 NOTE — Telephone Encounter (Signed)
LMTCB/sss  ILR reviewed. (18) tachy episodes recorded. Called patient for symptoms.  Episode list:     Date: Time:        Duration:   18 Appropriate Tachy 01-Jul-2018 01:28 00:03:18 231 bpm 188 bpm ECG 17 Appropriate Tachy 29-Jun-2018 17:36 00:00:18 188 bpm 188 bpm ECG 16 Appropriate Tachy 29-Jun-2018 17:15 00:00:45 188 bpm 188 bpm ECG 15 Appropriate Tachy 29-Jun-2018 17:03 00:00:29 188 bpm 188 bpm ECG 14 Appropriate Tachy 29-Jun-2018 15:24 00:00:12 188 bpm 188 bpm ECG 13 Appropriate Tachy 29-Jun-2018 15:11 00:00:07 188 bpm 188 bpm ECG 12 Appropriate Tachy 29-Jun-2018 15:09 00:01:26 194 bpm 188 bpm ECG 11 Appropriate Tachy 29-Jun-2018 15:08 00:00:32 188 bpm 188 bpm ECG 10 Appropriate Tachy 28-Jun-2018 21:00 00:00:17 188 bpm 188 bpm ECG 9 Appropriate Tachy 29-May-2018 18:34 00:00:26 188 bpm 188 bpm ECG 8 Appropriate Tachy 26-May-2018 13:08 00:00:25 188 bpm 188 bpm ECG 7 Appropriate Tachy 22-May-2018 20:16 00:00:54 194 bpm 188 bpm ECG 6 Appropriate Tachy 22-May-2018 20:12 00:00:05 188 bpm 188 bpm ECG 5 Appropriate Tachy 22-May-2018 20:12 00:00:13 188 bpm 188 bpm ECG 4 Appropriate Tachy 22-May-2018 20:10 00:01:37 207 bpm 188 bpm ECG 3 Appropriate Tachy 22-May-2018 19:54 00:00:06 188 bpm 188 bpm ECG 2 Appropriate Tachy 22-May-2018 19:50 00:00:21 188 bpm 188 bpm ECG 1 Appropriate Tachy 22-May-2018 19:38 00:01:09 194 bpm 188 bpm ECG  Presenting ECG (07/05/18 @1347 ):      Most recent Tachy ECG:

## 2018-07-11 NOTE — Telephone Encounter (Signed)
Left message for ptcb to discuss if he has been having any symptoms.

## 2018-07-13 NOTE — Telephone Encounter (Signed)
Left message x 3 to see if he has been having any symptoms.

## 2018-07-13 NOTE — Telephone Encounter (Signed)
ECGs printed and placed in Dr. Valarie Conesaylor's LINQ folder for review as we have not been able to reach patient to discuss any symptoms with episodes.

## 2018-07-22 LAB — CUP PACEART REMOTE DEVICE CHECK
Date Time Interrogation Session: 20191106163918
Implantable Pulse Generator Implant Date: 20191004

## 2018-07-27 ENCOUNTER — Other Ambulatory Visit: Payer: Self-pay

## 2018-07-27 NOTE — Patient Outreach (Signed)
First attempt to obtain mRs. No answer at main number, call went straight to voicemail. Left message for return call.

## 2018-07-31 ENCOUNTER — Telehealth: Payer: Self-pay | Admitting: Cardiology

## 2018-07-31 NOTE — Telephone Encounter (Signed)
Certified letter returned signed.

## 2018-07-31 NOTE — Telephone Encounter (Signed)
Opened in error

## 2018-08-01 ENCOUNTER — Other Ambulatory Visit: Payer: Self-pay

## 2018-08-01 ENCOUNTER — Telehealth: Payer: Self-pay

## 2018-08-01 NOTE — Patient Outreach (Signed)
Second attempt to obtain mRs. Left message on main number again. Called secondary number, a man answered, mumbled something, and hung up on me. Called a third time and left a voicemail.

## 2018-08-01 NOTE — Telephone Encounter (Signed)
Patient needs a letter stating his medical condition and why he is unable to work. Patient needs it tomorrow for court. Patient was advise that we were about to close and that I would get message to provider.

## 2018-08-07 ENCOUNTER — Ambulatory Visit (INDEPENDENT_AMBULATORY_CARE_PROVIDER_SITE_OTHER): Payer: Self-pay

## 2018-08-07 DIAGNOSIS — I63511 Cerebral infarction due to unspecified occlusion or stenosis of right middle cerebral artery: Secondary | ICD-10-CM

## 2018-08-08 ENCOUNTER — Other Ambulatory Visit: Payer: Self-pay

## 2018-08-08 LAB — CUP PACEART REMOTE DEVICE CHECK
Date Time Interrogation Session: 20200111174008
Implantable Pulse Generator Implant Date: 20191004

## 2018-08-08 NOTE — Progress Notes (Signed)
Carelink Summary Report / Loop Recorder 

## 2018-08-08 NOTE — Patient Outreach (Signed)
Third attempt to obtain mRs. No answer. Left message for return call with his mother.

## 2018-08-11 NOTE — Patient Outreach (Signed)
3 outreach attempts were completed to obtain mRs. mRs could not be obtained because patient never returned my calls. mRs=7 

## 2018-08-13 LAB — CUP PACEART REMOTE DEVICE CHECK
Date Time Interrogation Session: 20191209170537
Implantable Pulse Generator Implant Date: 20191004

## 2018-08-29 ENCOUNTER — Telehealth: Payer: Self-pay

## 2018-08-29 MED FILL — BRILINTA 90 MG TABLET: 90 | 30 days supply | Qty: 60 | Fill #1

## 2018-08-29 NOTE — Telephone Encounter (Signed)
LVM for pt to call device clinic to schedule apt with Dr. Ladona Ridgel in the next two weeks to discuss LINQ SVT episodes.

## 2018-08-30 ENCOUNTER — Telehealth: Payer: Self-pay

## 2018-09-07 ENCOUNTER — Ambulatory Visit (INDEPENDENT_AMBULATORY_CARE_PROVIDER_SITE_OTHER): Payer: Self-pay

## 2018-09-07 DIAGNOSIS — I63511 Cerebral infarction due to unspecified occlusion or stenosis of right middle cerebral artery: Secondary | ICD-10-CM

## 2018-09-08 LAB — CUP PACEART REMOTE DEVICE CHECK
Date Time Interrogation Session: 20200212155938
Implantable Pulse Generator Implant Date: 20191004

## 2018-09-19 NOTE — Progress Notes (Signed)
Carelink Summary Report / Loop Recorder 

## 2018-10-10 ENCOUNTER — Other Ambulatory Visit: Payer: Self-pay | Admitting: Internal Medicine

## 2018-10-10 ENCOUNTER — Other Ambulatory Visit: Payer: Self-pay

## 2018-10-10 ENCOUNTER — Ambulatory Visit (INDEPENDENT_AMBULATORY_CARE_PROVIDER_SITE_OTHER): Payer: Self-pay | Admitting: *Deleted

## 2018-10-10 DIAGNOSIS — I63511 Cerebral infarction due to unspecified occlusion or stenosis of right middle cerebral artery: Secondary | ICD-10-CM

## 2018-10-11 LAB — CUP PACEART REMOTE DEVICE CHECK
Date Time Interrogation Session: 20200317174054
Implantable Pulse Generator Implant Date: 20191004

## 2018-10-13 NOTE — Telephone Encounter (Signed)
error 

## 2018-10-18 NOTE — Progress Notes (Signed)
Carelink Summary Report / Loop Recorder 

## 2018-10-19 ENCOUNTER — Encounter: Payer: Self-pay | Admitting: *Deleted

## 2018-11-13 ENCOUNTER — Ambulatory Visit (INDEPENDENT_AMBULATORY_CARE_PROVIDER_SITE_OTHER): Payer: Self-pay | Admitting: *Deleted

## 2018-11-13 ENCOUNTER — Other Ambulatory Visit: Payer: Self-pay

## 2018-11-13 DIAGNOSIS — I63511 Cerebral infarction due to unspecified occlusion or stenosis of right middle cerebral artery: Secondary | ICD-10-CM

## 2018-11-13 LAB — CUP PACEART REMOTE DEVICE CHECK
Date Time Interrogation Session: 20200419184208
Implantable Pulse Generator Implant Date: 20191004

## 2018-11-21 NOTE — Progress Notes (Signed)
Carelink Summary Report / Loop Recorder 

## 2018-12-15 ENCOUNTER — Ambulatory Visit (INDEPENDENT_AMBULATORY_CARE_PROVIDER_SITE_OTHER): Payer: Self-pay | Admitting: *Deleted

## 2018-12-15 DIAGNOSIS — I63511 Cerebral infarction due to unspecified occlusion or stenosis of right middle cerebral artery: Secondary | ICD-10-CM

## 2018-12-16 LAB — CUP PACEART REMOTE DEVICE CHECK
Date Time Interrogation Session: 20200522184137
Implantable Pulse Generator Implant Date: 20191004

## 2018-12-26 NOTE — Progress Notes (Signed)
Carelink Summary Report / Loop Recorder 

## 2019-01-04 ENCOUNTER — Telehealth: Payer: Self-pay

## 2019-01-04 NOTE — Telephone Encounter (Signed)
Left a vm for patient to callback and do the screening for appointment 

## 2019-01-05 ENCOUNTER — Other Ambulatory Visit: Payer: Self-pay

## 2019-01-05 ENCOUNTER — Encounter: Payer: Self-pay | Admitting: Family Medicine

## 2019-01-05 ENCOUNTER — Ambulatory Visit (INDEPENDENT_AMBULATORY_CARE_PROVIDER_SITE_OTHER): Payer: Self-pay | Admitting: Family Medicine

## 2019-01-05 VITALS — BP 140/88 | HR 70 | Temp 98.0°F | Ht 68.0 in | Wt 162.2 lb

## 2019-01-05 DIAGNOSIS — R531 Weakness: Secondary | ICD-10-CM

## 2019-01-05 DIAGNOSIS — M109 Gout, unspecified: Secondary | ICD-10-CM

## 2019-01-05 DIAGNOSIS — Z09 Encounter for follow-up examination after completed treatment for conditions other than malignant neoplasm: Secondary | ICD-10-CM

## 2019-01-05 DIAGNOSIS — I10 Essential (primary) hypertension: Secondary | ICD-10-CM

## 2019-01-05 DIAGNOSIS — M1712 Unilateral primary osteoarthritis, left knee: Secondary | ICD-10-CM

## 2019-01-05 DIAGNOSIS — Z95818 Presence of other cardiac implants and grafts: Secondary | ICD-10-CM

## 2019-01-05 DIAGNOSIS — I63511 Cerebral infarction due to unspecified occlusion or stenosis of right middle cerebral artery: Secondary | ICD-10-CM

## 2019-01-05 DIAGNOSIS — E785 Hyperlipidemia, unspecified: Secondary | ICD-10-CM

## 2019-01-05 LAB — POCT URINALYSIS DIP (MANUAL ENTRY)
Bilirubin, UA: NEGATIVE
Blood, UA: NEGATIVE
Glucose, UA: NEGATIVE mg/dL
Ketones, POC UA: NEGATIVE mg/dL
Leukocytes, UA: NEGATIVE
Nitrite, UA: NEGATIVE
Protein Ur, POC: NEGATIVE mg/dL
Spec Grav, UA: 1.01 (ref 1.010–1.025)
Urobilinogen, UA: 0.2 E.U./dL
pH, UA: 6 (ref 5.0–8.0)

## 2019-01-05 MED ORDER — ROSUVASTATIN CALCIUM 20 MG PO TABS: 20.0000 mg | ORAL_TABLET | Freq: Every day | ORAL | 6 refills | Status: DC

## 2019-01-05 MED ORDER — COLCHICINE 0.6 MG PO TABS: ORAL_TABLET | ORAL | 4 refills | Status: DC

## 2019-01-05 MED ORDER — AMLODIPINE BESYLATE 5 MG PO TABS: 5.0000 mg | ORAL_TABLET | Freq: Every day | ORAL | 6 refills | Status: DC

## 2019-01-05 MED ORDER — IBUPROFEN 800 MG PO TABS: 800.0000 mg | ORAL_TABLET | Freq: Three times a day (TID) | ORAL | 3 refills | Status: DC | PRN

## 2019-01-05 MED FILL — AMLODIPINE BESYLATE 5 MG TA: 5 | 30 days supply | Qty: 30 | Fill #0

## 2019-01-05 MED FILL — !COLCRYS 0.6 MG TABLET: 0.6 MG | 15 days supply | Qty: 30 | Fill #0

## 2019-01-05 NOTE — Progress Notes (Signed)
[facility]    Established Patient Office Visit  Subjective:  Patient ID: Manuel Harrison, male    DOB: 03/24/1971  Age: 48 y.o. MRN: [id number]  CC:  Chief Complaint  Patient presents with  . Follow-up    chronic condition     HPI Manuel GroomsLyndon B Harrison is a 48 year old male who presents for Follow Up today.   Past Medical History:  Diagnosis Date  . 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 Arthritis    "left knee" (04/26/2018)  . High cholesterol   . History of gout   . Hypertension   . Stroke [facility](HCC) 09/2012   "right side is weaker since" (10//08/2017)   Current Status: Since his last office visit, he is doing well with no complaints. She denies visual changes, chest pain, cough, shortness of breath, heart palpitations, and falls. He has occasional headaches and dizziness with position changes. Denies severe headaches, confusion, seizures, double vision, and blurred vision, nausea and vomiting. He continues to smoke 6-7 cigarettes daily. He has been out of medication X 1 month, except Brilinta.  He denies fevers, chills, fatigue, recent infections, weight loss, and night sweats.  No reports of GI problems such as nausea, vomiting, diarrhea, and constipation. He has no reports of blood in stools, dysuria and hematuria. No depression or anxiety reported. He denies pain today.   Past Surgical History:  Procedure Laterality Date  . IR CT HEAD LTD  04/25/2018  . IR INTRA CRAN STENT  04/25/2018  . IR PERCUTANEOUS ART THROMBECTOMY/INFUSION INTRACRANIAL INC DIAG ANGIO  04/25/2018  . LOOP RECORDER INSERTION N/A 04/28/2018   Procedure: LOOP RECORDER INSERTION;  Surgeon: Marinus Mawaylor, Gregg W, MD;  [facility];  Service: Cardiovascular;  Laterality: N/A;  . RADIOLOGY WITH ANESTHESIA N/A 04/25/2018   Procedure: RADIOLOGY WITH ANESTHESIA;  Surgeon: Julieanne Cottoneveshwar, Sanjeev, MD;  Location: [facility] OR;   Service: Radiology;  Laterality: N/A;  . TEE WITHOUT CARDIOVERSION N/A 04/28/2018   Procedure: TRANSESOPHAGEAL ECHOCARDIOGRAM (TEE);  Surgeon: Jodelle Redhristopher, Bridgette, MD;  [facility];  Service: Cardiovascular;  Laterality: N/A;    Family History  Problem Relation Age of Onset  . Other Mother        no health concerns  . Other Father        no health concerns    Social History   Socioeconomic History  . Marital status: Single    Spouse name: Not on file  . Number of children: Not on file  . Years of education: Not on file  . Highest education level: Not on file  Occupational History  . Not on file  Social Needs  . Financial resource strain: Not on file  . Food insecurity    Worry: Not on file    Inability: Not on file  . Transportation needs    Medical: Not on file    Non-medical: Not on file  Tobacco Use  . Smoking status: Current Every Day Smoker    Packs/day: 2.00    Years: 32.00    Pack years: 64.00    Types: Cigarettes  . Smokeless tobacco: Never Used  Substance and Sexual Activity  . Alcohol use: Yes    Alcohol/week: 47.0 standard drinks    Types: 47 Cans of beer per week    Comment: 04/26/2018 "2- 40 ounce cans/day"  . Drug use: Yes    Types: Marijuana  Comment: 04/26/2018 "nothing regular; might smoke weekly, if that"  . Sexual activity: Yes  Lifestyle  . Physical activity    Days per week: Not on file    Minutes per session: Not on file  . Stress: Not on file  Relationships  . Social Herbalist on phone: Not on file    Gets together: Not on file    Attends religious service: Not on file    Active member of club or organization: Not on file    Attends meetings of clubs or organizations: Not on file    Relationship status: Not on file  . Intimate partner violence    Fear of current or ex partner: Not on file    Emotionally abused: Not on file    Physically abused: Not on file    Forced sexual activity: Not on file  Other  Topics Concern  . Not on file  Social History Narrative  . Not on file    Outpatient Medications Prior to Visit  Medication Sig Dispense Refill  . aspirin 81 MG chewable tablet Chew 1 tablet (81 mg total) by mouth daily. 30 tablet 12  . BRILINTA 90 MG TABS tablet Take 90 mg by mouth 2 (two) times daily.    [name] amLODipine (NORVASC) 5 MG tablet Take 1 tablet (5 mg total) by mouth daily. (Patient not taking: Reported on 01/05/2019) 30 tablet 3  . colchicine 0.6 MG tablet Take 2 times a day as needed for gout flare (Patient not taking: Reported on 01/05/2019) 60 tablet 1  . ibuprofen (ADVIL,MOTRIN) 800 MG tablet Take 1 tablet (800 mg total) by mouth every 8 (eight) hours as needed. (Patient not taking: Reported on 01/05/2019) 30 tablet 0  . rosuvastatin (CRESTOR) 20 MG tablet Take 1 tablet (20 mg total) by mouth daily at 6 PM. (Patient not taking: Reported on 01/05/2019) 30 tablet 6   No facility-administered medications prior to visit.     Allergies  Allergen Reactions  . Aleve [Naproxen Sodium] Nausea And Vomiting    ROS Review of Systems  Constitutional: Negative.   HENT: Negative.   Eyes: Negative.   Respiratory: Negative.   Cardiovascular: Negative.   Gastrointestinal: Negative.   Endocrine: Negative.   Genitourinary: Negative.   Musculoskeletal: Negative.   Skin: Negative.   Allergic/Immunologic: Negative.   Neurological: Positive for dizziness (occasional) and headaches (Occasional).  Hematological: Negative.   Psychiatric/Behavioral: Negative.       Objective:    Physical Exam  Constitutional: He is oriented to person, place, and time. He appears well-developed and well-nourished.  HENT:  Head: Normocephalic and atraumatic.  Eyes: Conjunctivae are normal.  Neck: Normal range of motion. Neck supple.  Cardiovascular: Normal rate, regular rhythm, normal heart sounds and intact distal pulses.  Pulmonary/Chest: Effort normal and breath sounds normal.  Abdominal: Soft.  Bowel sounds are normal.  Musculoskeletal: Normal range of motion.  Neurological: He is alert and oriented to person, place, and time. He has normal reflexes.  Skin: Skin is warm and dry.  Psychiatric: He has a normal mood and affect. His behavior is normal. Judgment and thought content normal.  Nursing note and vitals reviewed.   BP 140/88 (BP Location: Left Arm, Patient Position: Sitting, Cuff Size: Small)   Pulse 70   Temp 98 F (36.7 C) (Oral)   Ht 5\' 8"  (1.727 m)   Wt 162 lb 3.2 oz (73.6 kg)   SpO2 99%   BMI 24.66 kg/m  Wt  Readings from Last 3 Encounters:  01/05/19 162 lb 3.2 oz (73.6 kg)  07/05/18 162 lb (73.5 kg)  05/05/18 156 lb (70.8 kg)     There are no preventive care reminders to display for this patient.  There are no preventive care reminders to display for this patient.  No results found for: TSH Lab Results  Component Value Date   WBC 5.7 04/28/2018   HGB 12.4 (L) 04/28/2018   HCT 36.2 (L) 04/28/2018   MCV 90.5 04/28/2018   PLT 209 04/28/2018   Lab Results  Component Value Date   NA 139 04/28/2018   K 3.6 04/28/2018   CO2 22 04/28/2018   GLUCOSE 105 (H) 04/28/2018   BUN 10 04/28/2018   CREATININE 0.88 04/28/2018   BILITOT 0.8 04/25/2018   ALKPHOS 76 04/25/2018   AST 25 04/25/2018   ALT 12 04/25/2018   PROT 6.3 (L) 04/25/2018   ALBUMIN 4.0 04/25/2018   CALCIUM 9.1 04/28/2018   ANIONGAP 10 04/28/2018   Lab Results  Component Value Date   CHOL 139 04/25/2018   Lab Results  Component Value Date   HDL 33 (L) 04/25/2018   Lab Results  Component Value Date   LDLCALC UNABLE TO CALCULATE IF TRIGLYCERIDE OVER 400 mg/dL 16/10/960410/07/2017   Lab Results  Component Value Date   TRIG 790 (H) 04/25/2018   Lab Results  Component Value Date   CHOLHDL 4.2 04/25/2018   Lab Results  Component Value Date   HGBA1C 5.6 04/25/2018      Assessment & Plan:   1. Hypertension, unspecified type The current medical regimen is effective; blood pressure is  stable at 140/88 today; continue present plan and medications as prescribed. He will continue to decrease high sodium intake, excessive alcohol intake, increase potassium intake, smoking cessation, and increase physical activity of at least 30 minutes of cardio activity daily. He will continue to follow Heart Healthy or DASH diet. - amLODipine (NORVASC) 5 MG tablet; Take 1 tablet (5 mg total) by mouth daily.  Dispense: 30 tablet; Refill: 6  2. Acute ischemic right MCA stroke [facility](HCC) - Ambulatory referral to Neurology  3. Status post placement of implantable loop recorder - Ambulatory referral to Neurology  4. Acute gout involving toe of left foot, unspecified cause - colchicine 0.6 MG tablet; Take 2 times a day as needed for gout flare  Dispense: 60 tablet; Refill: 4  5. Osteoarthritis of left knee, unspecified osteoarthritis type - ibuprofen (ADVIL) 800 MG tablet; Take 1 tablet (800 mg total) by mouth every 8 (eight) hours as needed.  Dispense: 30 tablet; Refill: 3  6. Dyslipidemia - rosuvastatin (CRESTOR) 20 MG tablet; Take 1 tablet (20 mg total) by mouth daily at 6 PM.  Dispense: 30 tablet; Refill: 6  7. Right sided weakness  8. Follow up He will follow up in 6 months.  - POCT urinalysis dipstick  Meds ordered this encounter  Medications  . amLODipine (NORVASC) 5 MG tablet    Sig: Take 1 tablet (5 mg total) by mouth daily.    Dispense:  30 tablet    Refill:  6  . colchicine 0.6 MG tablet    Sig: Take 2 times a day as needed for gout flare    Dispense:  60 tablet    Refill:  4  . ibuprofen (ADVIL) 800 MG tablet    Sig: Take 1 tablet (800 mg total) by mouth every 8 (eight) hours as needed.    Dispense:  30 tablet  Refill:  3  . rosuvastatin (CRESTOR) 20 MG tablet    Sig: Take 1 tablet (20 mg total) by mouth daily at 6 PM.    Dispense:  30 tablet    Refill:  6    Orders Placed This Encounter  Procedures  . Ambulatory referral to Neurology  . POCT urinalysis dipstick      Referral Orders     Ambulatory referral to Neurology   Raliegh IpNatalie Shron Ozer,  MSN, FNP-BC [facility] [address]  [place], [place] [id number] [phone]   Problem List Items Addressed This Visit      Cardiovascular and Mediastinum   Acute ischemic right MCA stroke (HCC)   Relevant Medications   amLODipine (NORVASC) 5 MG tablet   rosuvastatin (CRESTOR) 20 MG tablet   Other Relevant Orders   Ambulatory referral to Neurology     Musculoskeletal and Integument   Osteoarthritis of left knee   Relevant Medications   colchicine 0.6 MG tablet   ibuprofen (ADVIL) 800 MG tablet     Other   Dyslipidemia   Relevant Medications   rosuvastatin (CRESTOR) 20 MG tablet    Other Visit Diagnoses    Hypertension, unspecified type    -  Primary   Relevant Medications   amLODipine (NORVASC) 5 MG tablet   rosuvastatin (CRESTOR) 20 MG tablet   Status post placement of implantable loop recorder       Relevant Orders   Ambulatory referral to Neurology   Acute gout involving toe of left foot, unspecified cause       Relevant Medications   colchicine 0.6 MG tablet   ibuprofen (ADVIL) 800 MG tablet   Right sided weakness       Follow up       Relevant Orders   POCT urinalysis dipstick (Completed)      Meds ordered this encounter  Medications  . amLODipine (NORVASC) 5 MG tablet    Sig: Take 1 tablet (5 mg total) by mouth daily.    Dispense:  30 tablet    Refill:  6  . colchicine 0.6 MG tablet    Sig: Take 2 times a day as needed for gout flare    Dispense:  60 tablet    Refill:  4  . ibuprofen (ADVIL) 800 MG tablet    Sig: Take 1 tablet (800 mg total) by mouth every 8 (eight) hours as needed.    Dispense:  30 tablet    Refill:  3  . rosuvastatin (CRESTOR) 20 MG tablet    Sig: Take 1 tablet (20 mg total) by mouth daily at 6 PM.    Dispense:  30 tablet    Refill:  6    Follow-up: Return in about 6 months (around 07/07/2019).    Kallie LocksNatalie  M Sweetie Giebler, FNP

## 2019-01-17 ENCOUNTER — Ambulatory Visit (INDEPENDENT_AMBULATORY_CARE_PROVIDER_SITE_OTHER): Payer: Self-pay | Admitting: *Deleted

## 2019-01-17 DIAGNOSIS — I63511 Cerebral infarction due to unspecified occlusion or stenosis of right middle cerebral artery: Secondary | ICD-10-CM

## 2019-01-18 LAB — CUP PACEART REMOTE DEVICE CHECK
Date Time Interrogation Session: 20200624193915
Implantable Pulse Generator Implant Date: 20191004

## 2019-01-24 DIAGNOSIS — S0292XA Unspecified fracture of facial bones, initial encounter for closed fracture: Secondary | ICD-10-CM

## 2019-01-24 HISTORY — DX: Unspecified fracture of facial bones, initial encounter for closed fracture: S02.92XA

## 2019-01-29 NOTE — Progress Notes (Signed)
Carelink Summary Report / Loop Recorder 

## 2019-02-05 ENCOUNTER — Telehealth: Payer: Self-pay

## 2019-02-05 NOTE — Telephone Encounter (Signed)
Spoke with patient regarding disconnected monitor 

## 2019-02-11 ENCOUNTER — Inpatient Hospital Stay (HOSPITAL_COMMUNITY)
Admission: EM | Admit: 2019-02-11 | Discharge: 2019-02-12 | DRG: 087 | Discharge status: Against Medical Advice | Payer: Medicaid Other | Attending: General Surgery | Admitting: General Surgery

## 2019-02-11 ENCOUNTER — Other Ambulatory Visit: Payer: Self-pay

## 2019-02-11 ENCOUNTER — Emergency Department (HOSPITAL_COMMUNITY): Payer: Medicaid Other

## 2019-02-11 DIAGNOSIS — Z5329 Procedure and treatment not carried out because of patient's decision for other reasons: Secondary | ICD-10-CM | POA: Diagnosis present

## 2019-02-11 DIAGNOSIS — I10 Essential (primary) hypertension: Secondary | ICD-10-CM | POA: Diagnosis present

## 2019-02-11 DIAGNOSIS — Z7901 Long term (current) use of anticoagulants: Secondary | ICD-10-CM

## 2019-02-11 DIAGNOSIS — S01511A Laceration without foreign body of lip, initial encounter: Secondary | ICD-10-CM | POA: Diagnosis present

## 2019-02-11 DIAGNOSIS — S0240DA Maxillary fracture, left side, initial encounter for closed fracture: Secondary | ICD-10-CM | POA: Diagnosis present

## 2019-02-11 DIAGNOSIS — R51 Headache: Secondary | ICD-10-CM | POA: Diagnosis present

## 2019-02-11 DIAGNOSIS — Z791 Long term (current) use of non-steroidal anti-inflammatories (NSAID): Secondary | ICD-10-CM | POA: Diagnosis not present

## 2019-02-11 DIAGNOSIS — E785 Hyperlipidemia, unspecified: Secondary | ICD-10-CM | POA: Diagnosis present

## 2019-02-11 DIAGNOSIS — S022XXA Fracture of nasal bones, initial encounter for closed fracture: Secondary | ICD-10-CM | POA: Diagnosis present

## 2019-02-11 DIAGNOSIS — Z23 Encounter for immunization: Secondary | ICD-10-CM | POA: Diagnosis not present

## 2019-02-11 DIAGNOSIS — S06340A Traumatic hemorrhage of right cerebrum without loss of consciousness, initial encounter: Secondary | ICD-10-CM | POA: Diagnosis present

## 2019-02-11 DIAGNOSIS — S0232XA Fracture of orbital floor, left side, initial encounter for closed fracture: Secondary | ICD-10-CM | POA: Diagnosis present

## 2019-02-11 DIAGNOSIS — I611 Nontraumatic intracerebral hemorrhage in hemisphere, cortical: Secondary | ICD-10-CM

## 2019-02-11 DIAGNOSIS — H538 Other visual disturbances: Secondary | ICD-10-CM | POA: Diagnosis present

## 2019-02-11 DIAGNOSIS — S0990XA Unspecified injury of head, initial encounter: Secondary | ICD-10-CM | POA: Diagnosis present

## 2019-02-11 DIAGNOSIS — Z1159 Encounter for screening for other viral diseases: Secondary | ICD-10-CM | POA: Diagnosis not present

## 2019-02-11 DIAGNOSIS — S0292XA Unspecified fracture of facial bones, initial encounter for closed fracture: Secondary | ICD-10-CM

## 2019-02-11 DIAGNOSIS — Z9119 Patient's noncompliance with other medical treatment and regimen: Secondary | ICD-10-CM

## 2019-02-11 DIAGNOSIS — F17210 Nicotine dependence, cigarettes, uncomplicated: Secondary | ICD-10-CM | POA: Diagnosis present

## 2019-02-11 DIAGNOSIS — Z8673 Personal history of transient ischemic attack (TIA), and cerebral infarction without residual deficits: Secondary | ICD-10-CM | POA: Diagnosis not present

## 2019-02-11 LAB — BASIC METABOLIC PANEL
Anion gap: 9 (ref 5–15)
BUN: 12 mg/dL (ref 6–20)
CO2: 20 mmol/L — ABNORMAL LOW (ref 22–32)
Calcium: 9.2 mg/dL (ref 8.9–10.3)
Chloride: 106 mmol/L (ref 98–111)
Creatinine, Ser: 1.27 mg/dL — ABNORMAL HIGH (ref 0.61–1.24)
GFR calc Af Amer: >60 mL/min (ref >60)
GFR calc non Af Amer: >60 mL/min (ref >60)
Glucose, Bld: 150 mg/dL — ABNORMAL HIGH (ref 70–99)
Potassium: 3.7 mmol/L (ref 3.5–5.1)
Sodium: 135 mmol/L (ref 135–145)

## 2019-02-11 LAB — CBC WITH DIFFERENTIAL/PLATELET
Abs Immature Granulocytes: 0.04 10*3/uL (ref 0.00–0.07)
Basophils Absolute: 0 10*3/uL (ref 0.0–0.1)
Basophils Relative: 0 %
Eosinophils Absolute: 0.1 10*3/uL (ref 0.0–0.5)
Eosinophils Relative: 1 %
HCT: 44 % (ref 39.0–52.0)
Hemoglobin: 15.3 g/dL (ref 13.0–17.0)
Immature Granulocytes: 0 %
Lymphocytes Relative: 20 %
Lymphs Abs: 2 10*3/uL (ref 0.7–4.0)
MCH: 31.7 pg (ref 26.0–34.0)
MCHC: 34.8 g/dL (ref 30.0–36.0)
MCV: 91.1 fL (ref 80.0–100.0)
Monocytes Absolute: 0.6 10*3/uL (ref 0.1–1.0)
Monocytes Relative: 6 %
Neutro Abs: 7.2 10*3/uL (ref 1.7–7.7)
Neutrophils Relative %: 73 %
Platelets: 278 10*3/uL (ref 150–400)
RBC: 4.83 MIL/uL (ref 4.22–5.81)
RDW: 13.2 % (ref 11.5–15.5)
WBC: 9.9 10*3/uL (ref 4.0–10.5)
nRBC: 0 % (ref 0.0–0.2)

## 2019-02-11 LAB — SARS CORONAVIRUS 2 BY RT PCR (HOSPITAL ORDER, PERFORMED IN ~~LOC~~ HOSPITAL LAB): SARS Coronavirus 2: NEGATIVE

## 2019-02-11 MED ORDER — ACETAMINOPHEN 500 MG PO TABS
1000.0000 mg | ORAL_TABLET | Freq: Once | ORAL | Status: AC
  Administered 2019-02-11: 1000 mg via ORAL
  Filled 2019-02-11: qty 2

## 2019-02-11 MED ORDER — LEVETIRACETAM 500 MG PO TABS
500.0000 mg | ORAL_TABLET | Freq: Two times a day (BID) | ORAL | Status: DC
  Administered 2019-02-12: 500 mg via ORAL
  Filled 2019-02-11: qty 1

## 2019-02-11 MED ORDER — TETANUS-DIPHTH-ACELL PERTUSSIS 5-2.5-18.5 LF-MCG/0.5 IM SUSP
0.5000 mL | Freq: Once | INTRAMUSCULAR | Status: AC
  Administered 2019-02-11: 0.5 mL via INTRAMUSCULAR
  Filled 2019-02-11: qty 0.5
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
## 2019-02-11 NOTE — ED Notes (Signed)
When pt wakes up have him call his sister
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
## 2019-02-12 ENCOUNTER — Inpatient Hospital Stay (HOSPITAL_COMMUNITY): Payer: Medicaid Other

## 2019-02-12 LAB — CBC
HCT: 40.9 % (ref 39.0–52.0)
Hemoglobin: 14.6 g/dL (ref 13.0–17.0)
MCH: 32.2 pg (ref 26.0–34.0)
MCHC: 35.7 g/dL (ref 30.0–36.0)
MCV: 90.3 fL (ref 80.0–100.0)
Platelets: 262 10*3/uL (ref 150–400)
RBC: 4.53 MIL/uL (ref 4.22–5.81)
RDW: 13.2 % (ref 11.5–15.5)
WBC: 7.2 10*3/uL (ref 4.0–10.5)
nRBC: 0 % (ref 0.0–0.2)

## 2019-02-12 MED ORDER — AMLODIPINE BESYLATE 5 MG PO TABS: 5.0000 mg | ORAL_TABLET | Freq: Every day | ORAL | Status: DC

## 2019-02-12 MED ORDER — ROSUVASTATIN CALCIUM 20 MG PO TABS: 20.0000 mg | ORAL_TABLET | Freq: Every day | ORAL | Status: DC

## 2019-02-12 MED ORDER — DOCUSATE SODIUM 100 MG PO CAPS: 100.0000 mg | ORAL_CAPSULE | Freq: Two times a day (BID) | ORAL | Status: DC

## 2019-02-12 MED ORDER — ONDANSETRON 4 MG PO TBDP: 4.0000 mg | ORAL_TABLET | Freq: Four times a day (QID) | ORAL | Status: DC | PRN

## 2019-02-12 MED ORDER — TRAMADOL HCL 50 MG PO TABS: 50.0000 mg | ORAL_TABLET | Freq: Four times a day (QID) | ORAL | Status: DC | PRN

## 2019-02-12 MED ORDER — HYDROMORPHONE HCL 1 MG/ML IJ SOLN: 0.5000 mg | INTRAMUSCULAR | Status: DC | PRN

## 2019-02-12 MED ORDER — ACETAMINOPHEN 325 MG PO TABS
650.0000 mg | ORAL_TABLET | Freq: Four times a day (QID) | ORAL | Status: DC
  Administered 2019-02-12: 650 mg via ORAL
  Filled 2019-02-12: qty 2

## 2019-02-12 MED ORDER — ONDANSETRON HCL 4 MG/2ML IJ SOLN: 4.0000 mg | Freq: Four times a day (QID) | INTRAMUSCULAR | Status: DC | PRN

## 2019-02-12 MED ORDER — LACTATED RINGERS IV SOLN
INTRAVENOUS | Status: DC
  Administered 2019-02-12: 02:00:00 via INTRAVENOUS

## 2019-02-12 MED ORDER — HYDRALAZINE HCL 20 MG/ML IJ SOLN: 10.0000 mg | INTRAMUSCULAR | Status: DC | PRN

## 2019-02-12 MED ORDER — OXYCODONE HCL 5 MG PO TABS: 5.0000 mg | ORAL_TABLET | ORAL | Status: DC | PRN

## 2019-02-12 MED ORDER — COLCHICINE 0.6 MG PO TABS: 0.6000 mg | ORAL_TABLET | Freq: Two times a day (BID) | ORAL | Status: DC

## 2019-02-12 MED ORDER — CHLORHEXIDINE GLUCONATE 0.12 % MT SOLN
15.0000 mL | Freq: Two times a day (BID) | OROMUCOSAL | Status: DC
  Filled 2019-02-12 (×2): qty 15

## 2019-02-12 NOTE — ED Notes (Signed)
Pt moved from [place] to hospital bed. Pt comfortable at this time. Pt. Denies needs.   Plan of care discussed with pt.
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
## 2019-02-12 NOTE — ED Notes (Signed)
Pt. Leaving AMA.
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
## 2019-02-12 NOTE — Progress Notes (Addendum)
  NEUROSURGERY PROGRESS NOTE   No issues overnight.  HA overall improved No N/T/W  EXAM:  BP 121/86   Pulse 81   Temp 99.7 F (37.6 C) (Oral)   Resp (!) 22   Ht 5\' 8"  (1.727 m)   Wt 77.1 kg   SpO2 95%   BMI 25.85 kg/m   Awake, alert, oriented  Speech fluent, appropriate  Left facial swelling 5/5 BUE/BLE   PLAN Stable neurologically Awaiting repeat head CT. If stable, cleared for d/c with outpt follow up  Addendum Repeat head CT stable. Cleared for d/c with outpt follow up. Complete 7d course of keppra for seizure prophylaxis. Remain off DAPT.  Noted: patient left AMA

## 2019-02-12 NOTE — ED Notes (Signed)
Unable to complete Neuro Assessment at this time. Pt asleep.
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
## 2019-02-12 NOTE — ED Notes (Signed)
Patient transported to CT 

## 2019-02-15 ENCOUNTER — Other Ambulatory Visit: Payer: Self-pay

## 2019-02-15 ENCOUNTER — Emergency Department (HOSPITAL_COMMUNITY)
Admission: EM | Admit: 2019-02-15 | Discharge: 2019-02-15 | Disposition: A | Discharge status: Home / Self Care | Payer: Medicaid Other | Attending: Emergency Medicine | Admitting: Emergency Medicine

## 2019-02-15 ENCOUNTER — Emergency Department (HOSPITAL_COMMUNITY): Payer: Medicaid Other

## 2019-02-15 ENCOUNTER — Telehealth: Payer: Self-pay | Admitting: *Deleted

## 2019-02-15 ENCOUNTER — Encounter (HOSPITAL_COMMUNITY): Payer: Self-pay | Admitting: Emergency Medicine

## 2019-02-15 DIAGNOSIS — S0232XD Fracture of orbital floor, left side, subsequent encounter for fracture with routine healing: Secondary | ICD-10-CM | POA: Diagnosis not present

## 2019-02-15 DIAGNOSIS — S022XXD Fracture of nasal bones, subsequent encounter for fracture with routine healing: Secondary | ICD-10-CM | POA: Insufficient documentation

## 2019-02-15 DIAGNOSIS — Z8673 Personal history of transient ischemic attack (TIA), and cerebral infarction without residual deficits: Secondary | ICD-10-CM | POA: Insufficient documentation

## 2019-02-15 DIAGNOSIS — F17210 Nicotine dependence, cigarettes, uncomplicated: Secondary | ICD-10-CM | POA: Insufficient documentation

## 2019-02-15 DIAGNOSIS — S0993XD Unspecified injury of face, subsequent encounter: Secondary | ICD-10-CM

## 2019-02-15 DIAGNOSIS — I611 Nontraumatic intracerebral hemorrhage in hemisphere, cortical: Secondary | ICD-10-CM

## 2019-02-15 DIAGNOSIS — Z7982 Long term (current) use of aspirin: Secondary | ICD-10-CM | POA: Diagnosis not present

## 2019-02-15 DIAGNOSIS — I10 Essential (primary) hypertension: Secondary | ICD-10-CM | POA: Insufficient documentation

## 2019-02-15 DIAGNOSIS — Z79899 Other long term (current) drug therapy: Secondary | ICD-10-CM | POA: Diagnosis not present

## 2019-02-15 DIAGNOSIS — R51 Headache: Secondary | ICD-10-CM | POA: Diagnosis present

## 2019-02-15 MED ORDER — OXYCODONE-ACETAMINOPHEN 5-325 MG PO TABS: 1.0000 | ORAL_TABLET | Freq: Four times a day (QID) | ORAL | 0 refills | Status: DC | PRN

## 2019-02-15 MED ORDER — OXYCODONE-ACETAMINOPHEN 5-325 MG PO TABS
1.0000 | ORAL_TABLET | Freq: Once | ORAL | Status: AC
  Administered 2019-02-15: 1 via ORAL
  Filled 2019-02-15: qty 1

## 2019-02-15 MED FILL — AMLODIPINE BESYLATE 5 MG TA: 5 | 30 days supply | Qty: 30 | Fill #1

## 2019-02-15 MED FILL — ROSUVASTATIN CALCIUM 20 MG: 20 | 30 days supply | Qty: 30 | Fill #0

## 2019-02-15 MED FILL — IBUPROFEN 800 MG TABLET: 800 | 10 days supply | Qty: 30 | Fill #0

## 2019-02-15 NOTE — ED Notes (Signed)
ED Provider at bedside. 
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
## 2019-02-15 NOTE — ED Notes (Signed)
Patient transported to CT 
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
## 2019-02-15 NOTE — ED Notes (Signed)
Family at bedside. 

## 2019-02-15 NOTE — Telephone Encounter (Signed)
TOC CM received call from pt stating he attempted to pick up med from [facility]. That clinic pharmacy does not dispense narcotics. Notified attending and Rx sent to [facility] per pt request. Made pt aware to pick up meds from [facility]. Jonnie Finner RN CCM Case Mgmt phone [phone]

## 2019-02-19 ENCOUNTER — Ambulatory Visit (INDEPENDENT_AMBULATORY_CARE_PROVIDER_SITE_OTHER): Payer: Self-pay | Admitting: *Deleted

## 2019-02-19 DIAGNOSIS — I639 Cerebral infarction, unspecified: Secondary | ICD-10-CM

## 2019-02-20 LAB — CUP PACEART REMOTE DEVICE CHECK
Date Time Interrogation Session: 20200727204117
Implantable Pulse Generator Implant Date: 20191004

## 2019-02-23 ENCOUNTER — Other Ambulatory Visit: Payer: Self-pay | Admitting: Internal Medicine

## 2019-03-07 NOTE — Progress Notes (Signed)
Carelink Summary Report / Loop Recorder 

## 2019-03-08 ENCOUNTER — Telehealth: Payer: Self-pay

## 2019-03-09 NOTE — Telephone Encounter (Signed)
Tried to contact patient again call not going through 

## 2019-03-09 NOTE — Telephone Encounter (Signed)
Tried to contact patient no answer 

## 2019-03-13 ENCOUNTER — Telehealth: Payer: Self-pay | Admitting: Family Medicine

## 2019-03-14 ENCOUNTER — Encounter: Payer: Self-pay | Admitting: Family Medicine

## 2019-03-14 ENCOUNTER — Other Ambulatory Visit: Payer: Self-pay

## 2019-03-14 ENCOUNTER — Ambulatory Visit (INDEPENDENT_AMBULATORY_CARE_PROVIDER_SITE_OTHER): Payer: Self-pay | Admitting: Family Medicine

## 2019-03-14 VITALS — BP 154/91 | HR 107 | Temp 97.8°F | Resp 22 | Ht 68.0 in | Wt 163.6 lb

## 2019-03-14 DIAGNOSIS — Z09 Encounter for follow-up examination after completed treatment for conditions other than malignant neoplasm: Secondary | ICD-10-CM

## 2019-03-14 DIAGNOSIS — S0993XS Unspecified injury of face, sequela: Secondary | ICD-10-CM

## 2019-03-14 DIAGNOSIS — I611 Nontraumatic intracerebral hemorrhage in hemisphere, cortical: Secondary | ICD-10-CM

## 2019-03-14 DIAGNOSIS — R519 Headache, unspecified: Secondary | ICD-10-CM

## 2019-03-14 DIAGNOSIS — S0993XA Unspecified injury of face, initial encounter: Secondary | ICD-10-CM

## 2019-03-14 DIAGNOSIS — R51 Headache: Secondary | ICD-10-CM

## 2019-03-14 MED ORDER — OXYCODONE-ACETAMINOPHEN 5-325 MG PO TABS: 1.0000 | ORAL_TABLET | Freq: Four times a day (QID) | ORAL | 0 refills | Status: DC | PRN
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
## 2019-03-14 NOTE — Telephone Encounter (Signed)
Sent to nurse

## 2019-03-25 LAB — CUP PACEART REMOTE DEVICE CHECK
Date Time Interrogation Session: 20200829213636
Implantable Pulse Generator Implant Date: 20191004

## 2019-03-26 ENCOUNTER — Ambulatory Visit (INDEPENDENT_AMBULATORY_CARE_PROVIDER_SITE_OTHER): Payer: Self-pay | Admitting: *Deleted

## 2019-03-26 DIAGNOSIS — I63511 Cerebral infarction due to unspecified occlusion or stenosis of right middle cerebral artery: Secondary | ICD-10-CM

## 2019-03-27 DIAGNOSIS — Z720 Tobacco use: Secondary | ICD-10-CM

## 2019-03-27 DIAGNOSIS — M109 Gout, unspecified: Secondary | ICD-10-CM

## 2019-03-27 HISTORY — DX: Gout, unspecified: M10.9

## 2019-03-27 HISTORY — DX: Tobacco use: Z72.0

## 2019-04-04 NOTE — Progress Notes (Signed)
Carelink Summary Report / Loop Recorder 

## 2019-04-09 ENCOUNTER — Telehealth: Payer: Self-pay

## 2019-04-09 NOTE — Telephone Encounter (Signed)
I spoke with the pt and asked him to send a manual transmission. Pt tried to send a manual transmission but the monitor did not go thru the steps. I gave the pt the number to [place] support to get additional help.

## 2019-04-10 NOTE — Telephone Encounter (Signed)
Transmission received.

## 2019-04-10 NOTE — Telephone Encounter (Signed)
Transmission reviewed. 2 tachy episodes, appear SVT, similar to previous episodes. Pt is scheduled for f/u with Dr. Lovena Le on 04/20/19.

## 2019-04-16 ENCOUNTER — Encounter: Payer: Self-pay | Admitting: Family Medicine

## 2019-04-16 ENCOUNTER — Other Ambulatory Visit: Payer: Self-pay

## 2019-04-16 ENCOUNTER — Ambulatory Visit (INDEPENDENT_AMBULATORY_CARE_PROVIDER_SITE_OTHER): Payer: Self-pay | Admitting: Family Medicine

## 2019-04-16 VITALS — BP 150/76 | HR 68 | Ht 68.0 in | Wt 170.0 lb

## 2019-04-16 DIAGNOSIS — R519 Headache, unspecified: Secondary | ICD-10-CM | POA: Insufficient documentation

## 2019-04-16 DIAGNOSIS — I10 Essential (primary) hypertension: Secondary | ICD-10-CM

## 2019-04-16 DIAGNOSIS — M1A9XX0 Chronic gout, unspecified, without tophus (tophi): Secondary | ICD-10-CM

## 2019-04-16 DIAGNOSIS — Z720 Tobacco use: Secondary | ICD-10-CM

## 2019-04-16 DIAGNOSIS — R51 Headache: Secondary | ICD-10-CM

## 2019-04-16 DIAGNOSIS — M109 Gout, unspecified: Secondary | ICD-10-CM

## 2019-04-16 DIAGNOSIS — S0993XS Unspecified injury of face, sequela: Secondary | ICD-10-CM

## 2019-04-16 DIAGNOSIS — Z09 Encounter for follow-up examination after completed treatment for conditions other than malignant neoplasm: Secondary | ICD-10-CM

## 2019-04-16 DIAGNOSIS — M1A0710 Idiopathic chronic gout, right ankle and foot, without tophus (tophi): Secondary | ICD-10-CM | POA: Insufficient documentation

## 2019-04-16 LAB — POCT URINALYSIS DIPSTICK
Bilirubin, UA: NEGATIVE
Glucose, UA: NEGATIVE
Ketones, UA: NEGATIVE
Leukocytes, UA: NEGATIVE
Nitrite, UA: NEGATIVE
Protein, UA: NEGATIVE
Spec Grav, UA: 1.01 (ref 1.010–1.025)
Urobilinogen, UA: 0.2 E.U./dL
pH, UA: 5.5 (ref 5.0–8.0)

## 2019-04-16 MED ORDER — ALLOPURINOL 100 MG PO TABS: 100.0000 mg | ORAL_TABLET | Freq: Every day | ORAL | 6 refills | Status: DC

## 2019-04-16 MED ORDER — COLCHICINE 0.6 MG PO TABS: ORAL_TABLET | ORAL | 6 refills | Status: DC

## 2019-04-16 NOTE — Progress Notes (Signed)
[facility]   Established Patient Office Visit  Subjective:  Patient ID: Manuel Harrison, male    DOB: [birth year]  Age: 48 y.o. MRN: [id number]  CC:  Chief Complaint  Patient presents with  . Follow-up    1 month follow ,still having pain in face     HPI Manuel Harrison is a 48 year old who presents for Follow Up today.   Past Medical History:  Diagnosis Date  . 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 Arthritis    "left knee" (04/26/2018)  . Facial fracture (HCC) 01/2019  . Gout 03/2019  . High cholesterol   . History of gout   . Hypertension   . Stroke [facility]) 09/2012   "right side is weaker since" (10//08/2017)  . Tobacco use 03/2019   Current Status: Since his last office visit, he is doing well with no complaints. His blood pressure readings are elevated today. He states that he did not take his Amlodipine this morning, and he has been smoking cigarettes also. He denies visual changes, chest pain, cough, shortness of breath, heart palpitations, and falls. He has occasional headaches and dizziness with position changes. Denies severe headaches, confusion, seizures, double vision, and blurred vision, nausea and vomiting. He reports moderate facial pain today.   He denies fevers, chills, fatigue, recent infections, weight loss, and night sweats.  No reports of GI problems such as diarrhea, and constipation. He has no reports of blood in stools, dysuria and hematuria. No depression or anxiety noted.   Past Surgical History:  Procedure Laterality Date  . IR CT HEAD LTD  04/25/2018  . IR INTRA CRAN STENT  04/25/2018  . IR PERCUTANEOUS ART THROMBECTOMY/INFUSION INTRACRANIAL INC DIAG ANGIO  04/25/2018  . LOOP RECORDER INSERTION N/A 04/28/2018   Procedure: LOOP RECORDER INSERTION;  Surgeon: Marinus Maw, MD;  Location: [facility] INVASIVE CV LAB;  Service: Cardiovascular;  Laterality: N/A;   . RADIOLOGY WITH ANESTHESIA N/A 04/25/2018   Procedure: RADIOLOGY WITH ANESTHESIA;  Surgeon: Julieanne Cotton, MD;  Location: [facility] OR;  Service: Radiology;  Laterality: N/A;  . TEE WITHOUT CARDIOVERSION N/A 04/28/2018   Procedure: TRANSESOPHAGEAL ECHOCARDIOGRAM (TEE);  Surgeon: Jodelle Red, MD;  Location: [facility] ENDOSCOPY;  Service: Cardiovascular;  Laterality: N/A;    Family History  Problem Relation Age of Onset  . Other Mother        no health concerns  . Other Father        no health concerns    Social History   Socioeconomic History  . Marital status: Single    Spouse name: Not on file  . Number of children: Not on file  . Years of education: Not on file  . Highest education level: Not on file  Occupational History  . Not on file  Social Needs  . Financial resource strain: Not on file  . Food insecurity    Worry: Not on file    Inability: Not on file  . Transportation needs    Medical: Not on file    Non-medical: Not on file  Tobacco Use  . Smoking status: Current Every Day Smoker    Packs/day: 2.00    Years: 32.00    Pack years: 64.00    Types: Cigarettes  . Smokeless tobacco: Never Used  Substance and Sexual Activity  . Alcohol use: Yes    Alcohol/week: 47.0 standard drinks  Types: 47 Cans of beer per week    Comment: 04/26/2018 "2- 40 ounce cans/day"  . Drug use: Yes    Types: Marijuana    Comment: 04/26/2018 "nothing regular; might smoke weekly, if that"  . Sexual activity: Yes  Lifestyle  . Physical activity    Days per week: Not on file    Minutes per session: Not on file  . Stress: Not on file  Relationships  . Social Herbalist on phone: Not on file    Gets together: Not on file    Attends religious service: Not on file    Active member of club or organization: Not on file    Attends meetings of clubs or organizations: Not on file    Relationship status: Not on file  . Intimate partner violence    Fear of current or ex  partner: Not on file    Emotionally abused: Not on file    Physically abused: Not on file    Forced sexual activity: Not on file  Other Topics Concern  . Not on file  Social History Narrative  . Not on file    Outpatient Medications Prior to Visit  Medication Sig Dispense Refill  . amLODipine (NORVASC) 5 MG tablet Take 1 tablet (5 mg total) by mouth daily. 30 tablet 6  . ibuprofen (ADVIL) 800 MG tablet Take 1 tablet (800 mg total) by mouth every 8 (eight) hours as needed. 30 tablet 3  . oxyCODONE-acetaminophen (PERCOCET) 5-325 MG tablet Take 1 tablet by mouth every 6 (six) hours as needed for moderate pain or severe pain. 15 tablet 0  . rosuvastatin (CRESTOR) 20 MG tablet Take 1 tablet (20 mg total) by mouth daily at 6 PM. 30 tablet 6  . ticagrelor (BRILINTA) 90 MG TABS tablet Take 90 mg by mouth 2 (two) times daily.    . colchicine 0.6 MG tablet Take 2 times a day as needed for gout flare 60 tablet 4  . aspirin EC 325 MG tablet Take 325 mg by mouth daily as needed (blood thinner when Brilinta is not available).    [name] aspirin EC 81 MG tablet Take 81 mg by mouth daily.     No facility-administered medications prior to visit.     Allergies  Allergen Reactions  . Aleve [Naproxen Sodium] Nausea And Vomiting    ROS Review of Systems  Constitutional: Negative.   HENT: Negative.   Eyes: Negative.   Respiratory: Negative.   Cardiovascular: Negative.   Gastrointestinal: Negative.   Endocrine: Negative.   Genitourinary: Negative.   Musculoskeletal: Negative.   Skin: Negative.   Allergic/Immunologic: Negative.   Neurological: Positive for dizziness (occasional) and headaches (occasional).  Hematological: Negative.   Psychiatric/Behavioral: Negative.       Objective:    Physical Exam  Constitutional: He is oriented to person, place, and time. He appears well-developed and well-nourished.  HENT:  Head: Normocephalic and atraumatic.  Eyes: Conjunctivae are normal.  Neck:  Normal range of motion. Neck supple.  Cardiovascular: Normal rate, regular rhythm, normal heart sounds and intact distal pulses.  Pulmonary/Chest: Effort normal and breath sounds normal.  Abdominal: Soft. Bowel sounds are normal.  Musculoskeletal: Normal range of motion.  Neurological: He is alert and oriented to person, place, and time. He has normal reflexes.  Skin: Skin is warm and dry.  Psychiatric: He has a normal mood and affect. His behavior is normal. Judgment and thought content normal.    BP (!) 150/76 (  BP Location: Left Arm, Patient Position: Sitting, Cuff Size: Normal)   Pulse 68   Ht 5\' 8"  (1.727 m)   Wt 170 lb (77.1 kg)   SpO2 100%   BMI 25.85 kg/m  Wt Readings from Last 3 Encounters:  04/16/19 170 lb (77.1 kg)  03/14/19 163 lb 9.6 oz (74.2 kg)  02/12/19 170 lb (77.1 kg)     Health Maintenance Due  Topic Date Due  . INFLUENZA VACCINE  02/24/2019    There are no preventive care reminders to display for this patient.  No results found for: TSH Lab Results  Component Value Date   WBC 7.2 02/12/2019   HGB 14.6 02/12/2019   HCT 40.9 02/12/2019   MCV 90.3 02/12/2019   PLT 262 02/12/2019   Lab Results  Component Value Date   NA 135 02/11/2019   K 3.7 02/11/2019   CO2 20 (L) 02/11/2019   GLUCOSE 150 (H) 02/11/2019   BUN 12 02/11/2019   CREATININE 1.27 (H) 02/11/2019   BILITOT 0.8 04/25/2018   ALKPHOS 76 04/25/2018   AST 25 04/25/2018   ALT 12 04/25/2018   PROT 6.3 (L) 04/25/2018   ALBUMIN 4.0 04/25/2018   CALCIUM 9.2 02/11/2019   ANIONGAP 9 02/11/2019   Lab Results  Component Value Date   CHOL 139 04/25/2018   Lab Results  Component Value Date   HDL 33 (L) 04/25/2018   Lab Results  Component Value Date   LDLCALC UNABLE TO CALCULATE IF TRIGLYCERIDE OVER 400 mg/dL 62/44/6950   Lab Results  Component Value Date   TRIG 790 (H) 04/25/2018   Lab Results  Component Value Date   CHOLHDL 4.2 04/25/2018   Lab Results  Component Value Date    HGBA1C 5.6 04/25/2018      Assessment & Plan:   1. Hypertension, unspecified type Blood pressure is elevated today. Patient counseled on refraining from smoking, coffee, and remembering to take hypertensive medications prior to his next office visit. He will continue to decrease high sodium intake, excessive alcohol intake, increase potassium intake, smoking cessation, and increase physical activity of at least 30 minutes of cardio activity daily. He will continue to follow Heart Healthy or DASH diet. - POCT Urinalysis Dipstick  2. Essential hypertension - Hepatic Function Panel  3. Facial injury, sequela  4. Left facial pain Improved. He will continue Motrin as prescribed.   5. Chronic gout of right foot, unspecified cause We will initiate Allopurinol today. He will take Colchicine as needed.  - allopurinol (ZYLOPRIM) 100 MG tablet; Take 1 tablet (100 mg total) by mouth daily.  Dispense: 30 tablet; Refill: 6 - colchicine 0.6 MG tablet; Take 2 times a day as needed for gout flare  Dispense: 30 tablet; Refill: 6 - Uric Acid - Hepatic Function Panel  6. Chronic gout involving toe of right foot without tophus, unspecified cause  7. Acute gout involving toe of left foot, unspecified cause - colchicine 0.6 MG tablet; Take 2 times a day as needed for gout flare  Dispense: 30 tablet; Refill: 6 - Uric Acid  8. Tobacco use Benefits of quitting smoking discussed with patient in detail. Declines smoking cessation at this time.  9. Follow up He will follow up for blood pressure check only in 1 week.  He will follow up in 3 months for office visit.   Meds ordered this encounter  Medications  . allopurinol (ZYLOPRIM) 100 MG tablet    Sig: Take 1 tablet (100 mg total) by mouth  daily.    Dispense:  30 tablet    Refill:  6  . colchicine 0.6 MG tablet    Sig: Take 2 times a day as needed for gout flare    Dispense:  30 tablet    Refill:  6    Orders Placed This Encounter  Procedures   . Uric Acid  . Hepatic Function Panel  . POCT Urinalysis Dipstick    Referral Orders  No referral(s) requested today    Raliegh IpNatalie Tecia Cinnamon,  MSN, FNP-BC [facility] [address]  [place], [place] [id number] [phone] fax   Problem List Items Addressed This Visit      Cardiovascular and Mediastinum   Essential hypertension   Relevant Orders   Hepatic Function Panel    Other Visit Diagnoses    Hypertension, unspecified type    -  Primary   Relevant Orders   POCT Urinalysis Dipstick (Completed)   Facial injury, sequela       Left facial pain       Chronic gout of right foot, unspecified cause       Relevant Medications   allopurinol (ZYLOPRIM) 100 MG tablet   colchicine 0.6 MG tablet   Other Relevant Orders   Uric Acid   Hepatic Function Panel   Chronic gout involving toe of right foot without tophus, unspecified cause       Relevant Medications   allopurinol (ZYLOPRIM) 100 MG tablet   colchicine 0.6 MG tablet   Acute gout involving toe of left foot, unspecified cause       Relevant Medications   allopurinol (ZYLOPRIM) 100 MG tablet   colchicine 0.6 MG tablet   Other Relevant Orders   Uric Acid   Tobacco use       Follow up          Meds ordered this encounter  Medications  . allopurinol (ZYLOPRIM) 100 MG tablet    Sig: Take 1 tablet (100 mg total) by mouth daily.    Dispense:  30 tablet    Refill:  6  . colchicine 0.6 MG tablet    Sig: Take 2 times a day as needed for gout flare    Dispense:  30 tablet    Refill:  6    Follow-up: No follow-ups on file.    Kallie LocksNatalie M Laurie Penado, FNP

## 2019-04-17 LAB — HEPATIC FUNCTION PANEL
ALT: 17 IU/L (ref 0–44)
AST: 22 IU/L (ref 0–40)
Albumin: 4.6 g/dL (ref 4.0–5.0)
Alkaline Phosphatase: 71 IU/L (ref 39–117)
Bilirubin Total: <0.2 mg/dL (ref 0.0–1.2)
Bilirubin, Direct: 0.05 mg/dL (ref 0.00–0.40)
Total Protein: 7.1 g/dL (ref 6.0–8.5)

## 2019-04-17 LAB — URIC ACID: Uric Acid: 6.6 mg/dL (ref 3.7–8.6)

## 2019-04-18 ENCOUNTER — Telehealth: Payer: Self-pay | Admitting: Family Medicine

## 2019-04-18 NOTE — Telephone Encounter (Signed)
Message left for patient's mother to contact office with additional questions if needed.

## 2019-04-20 ENCOUNTER — Encounter: Payer: Self-pay | Admitting: Internal Medicine

## 2019-04-23 ENCOUNTER — Other Ambulatory Visit: Payer: Self-pay

## 2019-04-23 ENCOUNTER — Telehealth: Payer: Self-pay

## 2019-04-23 ENCOUNTER — Other Ambulatory Visit: Payer: Self-pay | Admitting: Family Medicine

## 2019-04-23 ENCOUNTER — Ambulatory Visit (INDEPENDENT_AMBULATORY_CARE_PROVIDER_SITE_OTHER): Payer: Self-pay | Admitting: Family Medicine

## 2019-04-23 VITALS — BP 136/86

## 2019-04-23 DIAGNOSIS — Z0130 Encounter for examination of blood pressure without abnormal findings: Secondary | ICD-10-CM

## 2019-04-23 MED ORDER — VARENICLINE TARTRATE 0.5 MG PO TABS: 0.5000 mg | ORAL_TABLET | Freq: Two times a day (BID) | ORAL | 0 refills | Status: DC

## 2019-04-23 MED FILL — ROSUVASTATIN CALCIUM 20 MG: 20 | 30 days supply | Qty: 30 | Fill #1

## 2019-04-23 MED FILL — AMLODIPINE BESYLATE 5 MG TA: 5 | 30 days supply | Qty: 30 | Fill #2

## 2019-04-23 NOTE — Telephone Encounter (Signed)
Called, no answer. Left a message for patient that rx for chantix was sent to pharmacy and that he should contact cardiology for question regarding continuing the [place].

## 2019-04-23 NOTE — Telephone Encounter (Signed)
Patient came in for a bp check today. His bp was 136/86 Manually. He said that this determined if he should start back taking the Brilinta, Please advise if this can be restarted. Also he is asking if an rx for Chantix can be sent to pharmacy, he is trying to quit smoking

## 2019-04-26 ENCOUNTER — Ambulatory Visit (INDEPENDENT_AMBULATORY_CARE_PROVIDER_SITE_OTHER): Payer: Medicaid Other | Admitting: *Deleted

## 2019-04-26 DIAGNOSIS — I63511 Cerebral infarction due to unspecified occlusion or stenosis of right middle cerebral artery: Secondary | ICD-10-CM | POA: Diagnosis not present

## 2019-04-28 LAB — CUP PACEART REMOTE DEVICE CHECK
Date Time Interrogation Session: 20201001214613
Implantable Pulse Generator Implant Date: 20191004

## 2019-04-30 ENCOUNTER — Encounter: Payer: Self-pay | Admitting: Family Medicine

## 2019-05-02 ENCOUNTER — Telehealth: Payer: Self-pay

## 2019-05-02 NOTE — Telephone Encounter (Signed)
done

## 2019-05-03 NOTE — Progress Notes (Signed)
Carelink Summary Report / Loop Recorder 

## 2019-05-10 ENCOUNTER — Telehealth: Payer: Self-pay | Admitting: *Deleted

## 2019-05-10 NOTE — Telephone Encounter (Signed)
LINQ alert received for 3 "tachy" episodes, available ECG suggests SVT, duration 17min 55sec, median V rate 188bpm. Manual transmission required to review additional episodes. Patient also missed f/u appointment with Dr. Lovena Le on 04/20/19, will attempt to reschedule.  Attempted to reach patient at "home" number--male answered, gave updated contact numbers for patient after confirming that this RN was not contacting pt about a bill. Preferred number [phone], backup number [phone].   LMOVM at [phone] requesting call back to DC, direct number given. No answer, no VM setup at backup number.

## 2019-05-11 ENCOUNTER — Telehealth: Payer: Self-pay | Admitting: Family Medicine

## 2019-05-11 ENCOUNTER — Telehealth: Payer: Self-pay

## 2019-05-11 NOTE — Telephone Encounter (Signed)
Note sent to [facility] also

## 2019-05-11 NOTE — Telephone Encounter (Signed)
error 

## 2019-05-14 ENCOUNTER — Other Ambulatory Visit: Payer: Self-pay | Admitting: Family Medicine

## 2019-05-14 ENCOUNTER — Other Ambulatory Visit: Payer: Self-pay

## 2019-05-14 DIAGNOSIS — M109 Gout, unspecified: Secondary | ICD-10-CM

## 2019-05-14 DIAGNOSIS — S0990XS Unspecified injury of head, sequela: Secondary | ICD-10-CM

## 2019-05-14 DIAGNOSIS — S0993XS Unspecified injury of face, sequela: Secondary | ICD-10-CM

## 2019-05-14 DIAGNOSIS — M1A0710 Idiopathic chronic gout, right ankle and foot, without tophus (tophi): Secondary | ICD-10-CM

## 2019-05-14 DIAGNOSIS — Z8673 Personal history of transient ischemic attack (TIA), and cerebral infarction without residual deficits: Secondary | ICD-10-CM

## 2019-05-14 MED ORDER — ALLOPURINOL 100 MG PO TABS: 100.0000 mg | ORAL_TABLET | Freq: Every day | ORAL | 6 refills | Status: DC

## 2019-05-14 MED ORDER — PREDNISONE 10 MG PO TABS: ORAL_TABLET | ORAL | 0 refills | Status: DC

## 2019-05-16 NOTE — Telephone Encounter (Signed)
Able to reach patient. He denies any symptoms with tachy episode on 05/08/19. Pt agrees to send a manual transmission for review. He is also agreeable to rescheduling missed appointment with Dr. Lovena Le (or EP APP). Message routed to scheduler for assistance. Pt requests call at [phone].

## 2019-05-22 ENCOUNTER — Other Ambulatory Visit: Payer: Self-pay

## 2019-05-22 ENCOUNTER — Ambulatory Visit (HOSPITAL_COMMUNITY)
Admission: RE | Admit: 2019-05-22 | Discharge: 2019-05-22 | Disposition: A | Discharge status: Home / Self Care | Payer: Medicaid Other | Source: Ambulatory Visit | Attending: Family Medicine | Admitting: Family Medicine

## 2019-05-22 DIAGNOSIS — S0993XS Unspecified injury of face, sequela: Secondary | ICD-10-CM

## 2019-05-22 DIAGNOSIS — S0990XS Unspecified injury of head, sequela: Secondary | ICD-10-CM

## 2019-05-24 NOTE — Progress Notes (Deleted)
Cardiology Office Note Date:  05/24/2019  Patient ID:  Manuel, Harrison Jul 07, 1971, MRN [id number] PCP:  Azzie Glatter, FNP  Electrophysiologist:  Dr. Lovena Le  ***refresh   Chief Complaint: *** over due visit  History of Present Illness: Manuel Harrison is a 48 y.o. male with history of HTN, HLD, smoker, marijuana (coacine is mentioned in chart) his tox sceen negative in the hospital, and recurrent stroke >> loop.  He comes in today to be seen for Dr. Lovena Le.  Last seen by him at the tome of his loop implant 04/2018.  *** AF *** symptoms *** meds  Device information MDT ILR, implanted 04/28/18, cryptogenic stroke   Past Medical History:  Diagnosis Date  . Acute ischemic right MCA stroke ([place]) 04/25/2018   "left side weaker now" (04/26/2018)  . Arthritis    "left knee" (04/26/2018)  . Facial fracture ([place]) 01/2019  . Gout 03/2019  . High cholesterol   . History of gout   . Hypertension   . Stroke [facility]) 09/2012   "right side is weaker since" (10//08/2017)  . Tobacco use 03/2019    Past Surgical History:  Procedure Laterality Date  . IR CT HEAD LTD  04/25/2018  . IR INTRA CRAN STENT  04/25/2018  . IR PERCUTANEOUS ART THROMBECTOMY/INFUSION INTRACRANIAL INC DIAG ANGIO  04/25/2018  . LOOP RECORDER INSERTION N/A 04/28/2018   Procedure: LOOP RECORDER INSERTION;  Surgeon: Evans Lance, MD;  Location: [place] CV LAB;  Service: Cardiovascular;  Laterality: N/A;  . RADIOLOGY WITH ANESTHESIA N/A 04/25/2018   Procedure: RADIOLOGY WITH ANESTHESIA;  Surgeon: Luanne Bras, MD;  Location: [place];  Service: Radiology;  Laterality: N/A;  . TEE WITHOUT CARDIOVERSION N/A 04/28/2018   Procedure: TRANSESOPHAGEAL ECHOCARDIOGRAM (TEE);  Surgeon: Buford Dresser, MD;  Location: [facility] ENDOSCOPY;  Service: Cardiovascular;  Laterality: N/A;    Current Outpatient Medications  Medication Sig Dispense Refill  . allopurinol (ZYLOPRIM) 100 MG tablet Take 1 tablet (100 mg total)  by mouth daily. 30 tablet 6  . amLODipine (NORVASC) 5 MG tablet Take 1 tablet (5 mg total) by mouth daily. 30 tablet 6  . aspirin EC 325 MG tablet Take 325 mg by mouth daily as needed (blood thinner when Brilinta is not available).    [name] aspirin EC 81 MG tablet Take 81 mg by mouth daily.    . colchicine 0.6 MG tablet Take 2 times a day as needed for gout flare 30 tablet 6  . ibuprofen (ADVIL) 800 MG tablet Take 1 tablet (800 mg total) by mouth every 8 (eight) hours as needed. 30 tablet 3  . oxyCODONE-acetaminophen (PERCOCET) 5-325 MG tablet Take 1 tablet by mouth every 6 (six) hours as needed for moderate pain or severe pain. 15 tablet 0  . predniSONE (DELTASONE) 10 MG tablet Day #1: Take 6 tablets (Total=60 mg) by mouth.  Day #2: Take 5 tablets (Total=50 mg) by mouth. Day #3: Take 4 tablets (Total=40 mg) by mouth. Day #4: Take 3 tablets (Total = 30 mg) by mouth. Day #5: Take 2 tablets (Total = 20 mg) by mouth.  Day #6: Take 1 tablet by mouth..then complete. ` 21 tablet 0  . rosuvastatin (CRESTOR) 20 MG tablet Take 1 tablet (20 mg total) by mouth daily at 6 PM. 30 tablet 6  . ticagrelor (BRILINTA) 90 MG TABS tablet Take 90 mg by mouth 2 (two) times daily.    . varenicline (CHANTIX) 0.5 MG tablet Take 1 tablet (0.5 mg total)  by mouth 2 (two) times daily. (We will increase dose to 1 mg two times a day after completion.) 60 tablet 0   No current facility-administered medications for this visit.     Allergies:   Aleve [naproxen sodium]   Social History:  The patient  reports that he has been smoking cigarettes. He has a 64.00 pack-year smoking history. He has never used smokeless tobacco. He reports current alcohol use of about 47.0 standard drinks of alcohol per week. He reports current drug use. Drug: Marijuana.   Family History:  The patient's family history includes Other in his father and mother.  ROS:  Please see the history of present illness.  All other systems are reviewed and  otherwise negative.   PHYSICAL EXAM: *** VS:  There were no vitals taken for this visit. BMI: There is no height or weight on file to calculate BMI. Well nourished, well developed, in no acute distress  HEENT: normocephalic, atraumatic  Neck: no JVD, carotid bruits or masses Cardiac:  *** RRR; no significant murmurs, no rubs, or gallops Lungs:  *** CTA b/l, no wheezing, rhonchi or rales  Abd: soft, nontender MS: no deformity or *** atrophy Ext: *** no edema  Skin: warm and dry, no rash Neuro:  No gross deficits appreciated Psych: euthymic mood, full affect  *** ILR site is stable, no tethering or discomfort   EKG:  Done today and reviewed by myself shows *** ILR interrogation done today and reviewed by myself: ***  04/28/18: TEE No evidence of embolic source. No significant valve abnormalities. No LA/LAA or RA/RAA thrombus. No PFO by color doppler. No evidence of shunt with bubble study.  04/25/2018 TTE Study Conclusions - Left ventricle: The cavity size was normal. Wall thickness was increased in a pattern of moderate LVH. Systolic function was normal. The estimated ejection fraction was in the range of 60% to 65%. Wall motion was normal; there were no regional wall motion abnormalities. Features are consistent with a pseudonormal left ventricular filling pattern, with concomitant abnormal relaxation and increased filling pressure (grade 2 diastolic dysfunction).   Recent Labs: 02/11/2019: BUN 12; Creatinine, Ser 1.27; Potassium 3.7; Sodium 135 02/12/2019: Hemoglobin 14.6; Platelets 262 04/16/2019: ALT 17  No results found for requested labs within last 8760 hours.   CrCl cannot be calculated (Patient's most recent lab result is older than the maximum 21 days allowed.).   Wt Readings from Last 3 Encounters:  04/16/19 170 lb (77.1 kg)  03/14/19 163 lb 9.6 oz (74.2 kg)  02/12/19 170 lb (77.1 kg)     Other studies reviewed: Additional studies/records  reviewed today include: summarized above  ASSESSMENT AND PLAN:  1. Loop recorder     Cryptogenic stroke     ***  2. HTN     ***    Disposition: F/u with ***  Current medicines are reviewed at length with the patient today.  The patient did not have any concerns regarding medicines.***  Signed, Francis Dowse, PA-C 05/24/2019 5:57 AM     [facility] [address] [place] [address] [phone] (office)  [phone] (fax)

## 2019-05-25 ENCOUNTER — Ambulatory Visit: Payer: Medicaid Other | Admitting: Physician Assistant

## 2019-05-28 NOTE — Telephone Encounter (Signed)
Spoke with patient. Requested manual LINQ transmission. He agrees to send tonight when he returns home. No further questions at this time.

## 2019-05-29 ENCOUNTER — Ambulatory Visit (INDEPENDENT_AMBULATORY_CARE_PROVIDER_SITE_OTHER): Payer: Medicaid Other | Admitting: *Deleted

## 2019-05-29 DIAGNOSIS — I639 Cerebral infarction, unspecified: Secondary | ICD-10-CM | POA: Diagnosis not present

## 2019-05-30 LAB — CUP PACEART REMOTE DEVICE CHECK
Date Time Interrogation Session: 20201103214922
Implantable Pulse Generator Implant Date: 20191004

## 2019-05-31 MED FILL — AMLODIPINE BESYLATE 5 MG TA: 5 | 30 days supply | Qty: 30 | Fill #3

## 2019-05-31 MED FILL — ROSUVASTATIN CALCIUM 20 MG: 20 | 30 days supply | Qty: 30 | Fill #0

## 2019-06-11 ENCOUNTER — Other Ambulatory Visit: Payer: Self-pay | Admitting: Family Medicine

## 2019-06-14 NOTE — Telephone Encounter (Signed)
Spoke with patient, assisted with manual transmission. Advised we will call back if any concerns once transmission is reviewed. Pt verbalizes understanding, no further questions at this time.

## 2019-06-15 NOTE — Telephone Encounter (Signed)
Transmission received.

## 2019-06-18 NOTE — Progress Notes (Signed)
Carelink Summary Report / Loop Recorder 

## 2019-06-18 NOTE — Telephone Encounter (Signed)
Transmission reviewed--some tachy episode ECGs show gradual onset, occurred while patient active. Onset/termination not available for some episodes. ST/SVT, similar to previous episodes. Continue to monitor remotely via [facility].

## 2019-07-02 ENCOUNTER — Other Ambulatory Visit: Payer: Self-pay | Admitting: Family Medicine

## 2019-07-02 ENCOUNTER — Ambulatory Visit (INDEPENDENT_AMBULATORY_CARE_PROVIDER_SITE_OTHER): Payer: Medicaid Other | Admitting: *Deleted

## 2019-07-02 ENCOUNTER — Telehealth: Payer: Self-pay | Admitting: Family Medicine

## 2019-07-02 DIAGNOSIS — R519 Headache, unspecified: Secondary | ICD-10-CM

## 2019-07-02 DIAGNOSIS — I63511 Cerebral infarction due to unspecified occlusion or stenosis of right middle cerebral artery: Secondary | ICD-10-CM | POA: Diagnosis not present

## 2019-07-02 DIAGNOSIS — M1712 Unilateral primary osteoarthritis, left knee: Secondary | ICD-10-CM

## 2019-07-02 MED ORDER — IBUPROFEN 800 MG PO TABS: 800.0000 mg | ORAL_TABLET | Freq: Three times a day (TID) | ORAL | 3 refills | Status: DC | PRN

## 2019-07-02 MED FILL — AMLODIPINE BESYLATE 5 MG TA: 5 | 30 days supply | Qty: 30 | Fill #4

## 2019-07-02 MED FILL — ROSUVASTATIN CALCIUM 20 MG: 20 | 30 days supply | Qty: 30 | Fill #1

## 2019-07-03 LAB — CUP PACEART REMOTE DEVICE CHECK
Date Time Interrogation Session: 20201208071152
Implantable Pulse Generator Implant Date: 20191004

## 2019-07-03 NOTE — Telephone Encounter (Signed)
done

## 2019-07-09 ENCOUNTER — Ambulatory Visit: Payer: Self-pay | Admitting: Family Medicine

## 2019-08-01 ENCOUNTER — Ambulatory Visit: Payer: Self-pay | Admitting: Family Medicine

## 2019-08-03 ENCOUNTER — Ambulatory Visit (INDEPENDENT_AMBULATORY_CARE_PROVIDER_SITE_OTHER): Payer: Medicaid Other | Admitting: *Deleted

## 2019-08-03 DIAGNOSIS — I63511 Cerebral infarction due to unspecified occlusion or stenosis of right middle cerebral artery: Secondary | ICD-10-CM

## 2019-08-05 LAB — CUP PACEART REMOTE DEVICE CHECK
Date Time Interrogation Session: 20210109185335
Implantable Pulse Generator Implant Date: 20191004

## 2019-08-07 ENCOUNTER — Other Ambulatory Visit: Payer: Self-pay | Admitting: Family Medicine

## 2019-08-07 MED ORDER — COLCHICINE 0.6 MG PO CAPS: 0.6000 mg | ORAL_CAPSULE | Freq: Two times a day (BID) | ORAL | 6 refills | Status: DC | PRN

## 2019-08-07 MED FILL — AMLODIPINE BESYLATE 5 MG TA: 5 | 30 days supply | Qty: 30 | Fill #5

## 2019-08-07 MED FILL — ROSUVASTATIN CALCIUM 20 MG: 20 | 30 days supply | Qty: 30 | Fill #2

## 2019-08-07 MED FILL — IBUPROFEN 800 MG TABLET: 800 | 10 days supply | Qty: 30 | Fill #1

## 2019-08-07 MED FILL — MITIGARE 0.6 MG CAPSULE: 0.6 | 15 days supply | Qty: 30 | Fill #0

## 2019-08-10 ENCOUNTER — Other Ambulatory Visit: Payer: Self-pay | Admitting: Family Medicine

## 2019-08-10 ENCOUNTER — Telehealth: Payer: Self-pay | Admitting: Family Medicine

## 2019-08-20 ENCOUNTER — Ambulatory Visit: Payer: Self-pay | Admitting: Family Medicine

## 2019-08-31 MED FILL — IBUPROFEN 800 MG TABLET: 800 | 10 days supply | Qty: 30 | Fill #2

## 2019-08-31 MED FILL — MITIGARE 0.6 MG CAPSULE: 0.6 | 15 days supply | Qty: 30 | Fill #0

## 2019-08-31 MED FILL — AMLODIPINE BESYLATE 5 MG TA: 5 | 30 days supply | Qty: 30 | Fill #6

## 2019-09-03 ENCOUNTER — Ambulatory Visit (INDEPENDENT_AMBULATORY_CARE_PROVIDER_SITE_OTHER): Payer: Medicaid Other | Admitting: *Deleted

## 2019-09-03 DIAGNOSIS — I63511 Cerebral infarction due to unspecified occlusion or stenosis of right middle cerebral artery: Secondary | ICD-10-CM | POA: Diagnosis not present

## 2019-09-03 LAB — CUP PACEART REMOTE DEVICE CHECK
Date Time Interrogation Session: 20210207235629
Implantable Pulse Generator Implant Date: 20191004

## 2019-09-04 NOTE — Progress Notes (Signed)
ILR Remote 

## 2019-09-21 ENCOUNTER — Other Ambulatory Visit: Payer: Self-pay | Admitting: Family Medicine

## 2019-09-21 DIAGNOSIS — I10 Essential (primary) hypertension: Secondary | ICD-10-CM

## 2019-09-21 MED FILL — IBUPROFEN 800 MG TABLET: 800 | 10 days supply | Qty: 30 | Fill #3

## 2019-09-21 MED FILL — ROSUVASTATIN CALCIUM 20 MG: 20 | 30 days supply | Qty: 30 | Fill #3

## 2019-09-24 DIAGNOSIS — E559 Vitamin D deficiency, unspecified: Secondary | ICD-10-CM

## 2019-09-24 HISTORY — DX: Vitamin D deficiency, unspecified: E55.9

## 2019-09-26 MED FILL — AMLODIPINE BESYLATE 5 MG TA: 5 | 30 days supply | Qty: 30 | Fill #0

## 2019-10-04 ENCOUNTER — Ambulatory Visit (INDEPENDENT_AMBULATORY_CARE_PROVIDER_SITE_OTHER): Payer: Medicaid Other | Admitting: *Deleted

## 2019-10-04 DIAGNOSIS — I63511 Cerebral infarction due to unspecified occlusion or stenosis of right middle cerebral artery: Secondary | ICD-10-CM

## 2019-10-04 LAB — CUP PACEART REMOTE DEVICE CHECK
Date Time Interrogation Session: 20210311002239
Implantable Pulse Generator Implant Date: 20191004

## 2019-10-04 NOTE — Progress Notes (Signed)
ILR Remote 

## 2019-10-10 ENCOUNTER — Other Ambulatory Visit: Payer: Self-pay | Admitting: Family Medicine

## 2019-10-10 ENCOUNTER — Ambulatory Visit (INDEPENDENT_AMBULATORY_CARE_PROVIDER_SITE_OTHER): Payer: Medicaid Other | Admitting: Family Medicine

## 2019-10-10 ENCOUNTER — Other Ambulatory Visit: Payer: Self-pay

## 2019-10-10 ENCOUNTER — Telehealth: Payer: Self-pay

## 2019-10-10 ENCOUNTER — Encounter: Payer: Self-pay | Admitting: Family Medicine

## 2019-10-10 VITALS — BP 142/85 | HR 96 | Temp 98.9°F | Ht 68.0 in | Wt 165.4 lb

## 2019-10-10 DIAGNOSIS — M109 Gout, unspecified: Secondary | ICD-10-CM | POA: Diagnosis not present

## 2019-10-10 DIAGNOSIS — I10 Essential (primary) hypertension: Secondary | ICD-10-CM

## 2019-10-10 DIAGNOSIS — M1712 Unilateral primary osteoarthritis, left knee: Secondary | ICD-10-CM | POA: Diagnosis not present

## 2019-10-10 DIAGNOSIS — E785 Hyperlipidemia, unspecified: Secondary | ICD-10-CM

## 2019-10-10 DIAGNOSIS — Z09 Encounter for follow-up examination after completed treatment for conditions other than malignant neoplasm: Secondary | ICD-10-CM

## 2019-10-10 DIAGNOSIS — M1A0710 Idiopathic chronic gout, right ankle and foot, without tophus (tophi): Secondary | ICD-10-CM | POA: Diagnosis not present

## 2019-10-10 DIAGNOSIS — Z0000 Encounter for general adult medical examination without abnormal findings: Secondary | ICD-10-CM | POA: Diagnosis not present

## 2019-10-10 DIAGNOSIS — Z716 Tobacco abuse counseling: Secondary | ICD-10-CM

## 2019-10-10 DIAGNOSIS — Z720 Tobacco use: Secondary | ICD-10-CM

## 2019-10-10 LAB — POCT URINALYSIS DIPSTICK
Bilirubin, UA: NEGATIVE
Blood, UA: NEGATIVE
Glucose, UA: NEGATIVE
Ketones, UA: NEGATIVE
Leukocytes, UA: NEGATIVE
Nitrite, UA: NEGATIVE
Protein, UA: NEGATIVE
Spec Grav, UA: 1.02 (ref 1.010–1.025)
Urobilinogen, UA: 1 E.U./dL
pH, UA: 6 (ref 5.0–8.0)

## 2019-10-10 LAB — POCT GLYCOSYLATED HEMOGLOBIN (HGB A1C): Hemoglobin A1C: 5.5 % (ref 4.0–5.6)

## 2019-10-10 LAB — GLUCOSE, POCT (MANUAL RESULT ENTRY): POC Glucose: 103 mg/dl — AB (ref 70–99)

## 2019-10-10 MED ORDER — IBUPROFEN 800 MG PO TABS: 800.0000 mg | ORAL_TABLET | Freq: Three times a day (TID) | ORAL | 6 refills | Status: DC | PRN

## 2019-10-10 MED ORDER — ASPIRIN EC 81 MG PO TBEC: 81.0000 mg | DELAYED_RELEASE_TABLET | Freq: Every day | ORAL | 6 refills | Status: DC

## 2019-10-10 MED ORDER — ALLOPURINOL 100 MG PO TABS: 100.0000 mg | ORAL_TABLET | Freq: Every day | ORAL | 6 refills | Status: DC

## 2019-10-10 MED ORDER — COLCHICINE 0.6 MG PO CAPS: 0.6000 mg | ORAL_CAPSULE | Freq: Two times a day (BID) | ORAL | 3 refills | Status: DC | PRN

## 2019-10-10 MED ORDER — AMLODIPINE BESYLATE 5 MG PO TABS: 5.0000 mg | ORAL_TABLET | Freq: Every day | ORAL | 6 refills | Status: DC

## 2019-10-10 MED ORDER — COLCHICINE 0.6 MG PO CAPS: 0.6000 mg | ORAL_CAPSULE | Freq: Two times a day (BID) | ORAL | 6 refills | Status: DC | PRN

## 2019-10-10 MED ORDER — ROSUVASTATIN CALCIUM 20 MG PO TABS: 20.0000 mg | ORAL_TABLET | Freq: Every day | ORAL | 6 refills | Status: DC

## 2019-10-10 MED ORDER — CHANTIX STARTING MONTH PAK 0.5 MG X 11 & 1 MG X 42 PO TABS: ORAL_TABLET | ORAL | 0 refills | Status: DC

## 2019-10-10 MED FILL — CHANTIX STARTING MONTH BOX: 0.5 MG X 11 | 30 days supply | Qty: 53 | Fill #0

## 2019-10-10 MED FILL — ALLOPURINOL 100 MG TABLET: 100 | 30 days supply | Qty: 30 | Fill #0

## 2019-10-10 MED FILL — IBUPROFEN 800 MG TABLET: 800 | 10 days supply | Qty: 30 | Fill #0

## 2019-10-10 NOTE — Progress Notes (Signed)
Patient Manuel Harrison   Established Patient Office Visit  Subjective:  Patient ID: ABB GOBERT, male    DOB: 19-Aug-1970  Age: 49 y.o. MRN: [id number]  CC:  Chief Complaint  Patient presents with  . Follow-up    HTN    HPI Manuel Harrison is a 49 year old male who presents for Follow Up today.   Past Medical History:  Diagnosis Date  . Acute ischemic right MCA stroke ([place]) 04/25/2018   "left side weaker now" (04/26/2018)  . Arthritis    "left knee" (04/26/2018)  . Facial fracture ([place]) 01/2019  . Gout 03/2019  . High cholesterol   . History of gout   . Hypertension   . Stroke [facility]) 09/2012   "right side is weaker since" (10//08/2017)  . Tobacco use 03/2019   Current Status: Since his last office visit, he is doing well with no complaints. He denies visual changes, chest pain, cough, shortness of breath, heart palpitations, and falls. He has occasional headaches and dizziness with position changes. Denies severe headaches, confusion, seizures, double vision, and blurred vision, nausea and vomiting. He continues to smoke 1/2 cigarettes daily. He denies fevers, chills, fatigue, recent infections, weight loss, and night sweats. No reports of GI problems such as nausea, vomiting, diarrhea, and constipation. He has no reports of blood in stools, dysuria and hematuria. No depression or anxiety reported today. He denies suicidal ideations, homicidal ideations, or auditory hallucinations. He denies pain today.   Past Surgical History:  Procedure Laterality Date  . IR CT HEAD LTD  04/25/2018  . IR INTRA CRAN STENT  04/25/2018  . IR PERCUTANEOUS ART THROMBECTOMY/INFUSION INTRACRANIAL INC DIAG ANGIO  04/25/2018  . LOOP RECORDER INSERTION N/A 04/28/2018   Procedure: LOOP RECORDER INSERTION;  Surgeon: Evans Lance, MD;  Location: [facility];  Service: Cardiovascular;  Laterality: N/A;  . RADIOLOGY WITH ANESTHESIA N/A 04/25/2018   Procedure:  RADIOLOGY WITH ANESTHESIA;  Surgeon: Luanne Bras, MD;  Location: [facility];  Service: Radiology;  Laterality: N/A;  . TEE WITHOUT CARDIOVERSION N/A 04/28/2018   Procedure: TRANSESOPHAGEAL ECHOCARDIOGRAM (TEE);  Surgeon: Buford Dresser, MD;  Location: [facility] ENDOSCOPY;  Service: Cardiovascular;  Laterality: N/A;    Family History  Problem Relation Age of Onset  . Other Mother        no health concerns  . Other Father        no health concerns    Social History   Socioeconomic History  . Marital status: Single    Spouse name: Not on file  . Number of children: Not on file  . Years of education: Not on file  . Highest education level: Not on file  Occupational History  . Not on file  Tobacco Use  . Smoking status: Current Every Day Smoker    Packs/day: 2.00    Years: 32.00    Pack years: 64.00    Types: Cigarettes  . Smokeless tobacco: Never Used  Substance and Sexual Activity  . Alcohol use: Yes    Alcohol/week: 47.0 standard drinks    Types: 47 Cans of beer per week    Comment: 04/26/2018 "2- 40 ounce cans/day"  . Drug use: Yes    Types: Marijuana    Comment: 04/26/2018 "nothing regular; might smoke weekly, if that"  . Sexual activity: Yes  Other Topics Concern  . Not on file  Social History Narrative  . Not on file   Social Determinants  of Health   Financial Resource Strain:   . Difficulty of Paying Living Expenses:   Food Insecurity:   . Worried About Programme researcher, broadcasting/film/video in the Last Year:   . Barista in the Last Year:   Transportation Needs:   . Freight forwarder (Medical):   [name] Lack of Transportation (Non-Medical):   Physical Activity:   . Days of Exercise per Week:   . Minutes of Exercise per Session:   Stress:   . Feeling of Stress :   Social Connections:   . Frequency of Communication with Friends and Family:   . Frequency of Social Gatherings with Friends and Family:   . Attends Religious Services:   . Active Member of Clubs or  Organizations:   . Attends Banker Meetings:   [name] Marital Status:   Intimate Partner Violence:   . Fear of Current or Ex-Partner:   . Emotionally Abused:   [name] Physically Abused:   . Sexually Abused:     Outpatient Medications Prior to Visit  Medication Sig Dispense Refill  . ticagrelor (BRILINTA) 90 MG TABS tablet Take 90 mg by mouth 2 (two) times daily.    [name] allopurinol (ZYLOPRIM) 100 MG tablet Take 1 tablet (100 mg total) by mouth daily. 30 tablet 6  . amLODipine (NORVASC) 5 MG tablet TAKE 1 TABLET (5 MG TOTAL) BY MOUTH DAILY. 30 tablet 6  . aspirin EC 325 MG tablet Take 325 mg by mouth daily as needed (blood thinner when Brilinta is not available).    [name] aspirin EC 81 MG tablet Take 81 mg by mouth daily.    . Colchicine (MITIGARE) 0.6 MG CAPS Take 0.6 mg by mouth 2 (two) times daily as needed (for gouty flare ups). 30 capsule 6  . ibuprofen (ADVIL) 800 MG tablet Take 1 tablet (800 mg total) by mouth every 8 (eight) hours as needed. 30 tablet 3  . rosuvastatin (CRESTOR) 20 MG tablet Take 1 tablet (20 mg total) by mouth daily at 6 PM. 30 tablet 6  . oxyCODONE-acetaminophen (PERCOCET) 5-325 MG tablet Take 1 tablet by mouth every 6 (six) hours as needed for moderate pain or severe pain. 15 tablet 0  . predniSONE (DELTASONE) 10 MG tablet Day #1: Take 6 tablets (Total=60 mg) by mouth.  Day #2: Take 5 tablets (Total=50 mg) by mouth. Day #3: Take 4 tablets (Total=40 mg) by mouth. Day #4: Take 3 tablets (Total = 30 mg) by mouth. Day #5: Take 2 tablets (Total = 20 mg) by mouth.  Day #6: Take 1 tablet by mouth..then complete. ` 21 tablet 0  . varenicline (CHANTIX) 0.5 MG tablet Take 1 tablet (0.5 mg total) by mouth 2 (two) times daily. (We will increase dose to 1 mg two times a day after completion.) 60 tablet 0   No facility-administered medications prior to visit.    Allergies  Allergen Reactions  . Aleve [Naproxen Sodium] Nausea And Vomiting    ROS Review of Systems    Constitutional: Negative.   HENT: Negative.   Eyes: Negative.   Respiratory: Negative.   Cardiovascular: Negative.   Gastrointestinal: Negative.   Endocrine: Negative.   Genitourinary: Negative.   Musculoskeletal: Negative.   Skin: Negative.   Allergic/Immunologic: Negative.   Neurological: Positive for dizziness (occasional) and headaches (occasional ).  Hematological: Negative.   Psychiatric/Behavioral: Negative.       Objective:    Physical Exam  Constitutional: He is oriented to person, place, and  time. He appears well-developed and well-nourished.  HENT:  Head: Normocephalic and atraumatic.  Eyes: Conjunctivae are normal.  Cardiovascular: Normal rate and intact distal pulses.  Pulmonary/Chest: Effort normal and breath sounds normal.  Abdominal: Soft. Bowel sounds are normal.  Musculoskeletal:        General: Normal range of motion.     Cervical back: Normal range of motion and neck supple.  Neurological: He is alert and oriented to person, place, and time. He has normal reflexes.  Skin: Skin is warm.  Psychiatric: He has a normal mood and affect. His behavior is normal. Judgment and thought content normal.  Nursing note and vitals reviewed.   BP (!) 142/85   Pulse 96   Temp 98.9 F (37.2 C) (Oral)   Ht 5\' 8"  (1.727 m)   Wt 165 lb 6.4 oz (75 kg)   SpO2 100%   BMI 25.15 kg/m  Wt Readings from Last 3 Encounters:  10/10/19 165 lb 6.4 oz (75 kg)  04/16/19 170 lb (77.1 kg)  03/14/19 163 lb 9.6 oz (74.2 kg)     Health Maintenance Due  Topic Date Due  . INFLUENZA VACCINE  02/24/2019  . COLONOSCOPY  Never done    There are no preventive Harrison reminders to display for this patient.  Lab Results  Component Value Date   TSH 1.650 10/10/2019   Lab Results  Component Value Date   WBC 8.6 10/10/2019   HGB 15.5 10/10/2019   HCT 45.2 10/10/2019   MCV 92 10/10/2019   PLT 276 10/10/2019   Lab Results  Component Value Date   NA 138 10/10/2019   K 4.1  10/10/2019   CO2 20 10/10/2019   GLUCOSE 91 10/10/2019   BUN 9 10/10/2019   CREATININE 1.02 10/10/2019   BILITOT 0.3 10/10/2019   ALKPHOS 64 10/10/2019   AST 25 10/10/2019   ALT 14 10/10/2019   PROT 7.8 10/10/2019   ALBUMIN 5.1 (H) 10/10/2019   CALCIUM 10.0 10/10/2019   ANIONGAP 9 02/11/2019   Lab Results  Component Value Date   CHOL 121 10/10/2019   Lab Results  Component Value Date   HDL 41 10/10/2019   Lab Results  Component Value Date   LDLCALC 57 10/10/2019   Lab Results  Component Value Date   TRIG 131 10/10/2019   Lab Results  Component Value Date   CHOLHDL 3.0 10/10/2019   Lab Results  Component Value Date   HGBA1C 5.5 10/10/2019      Assessment & Plan:   1. Chronic gout of right foot, unspecified cause - allopurinol (ZYLOPRIM) 100 MG tablet; Take 1 tablet (100 mg total) by mouth daily.  Dispense: 30 tablet; Refill: 6  2. Hypertension, unspecified type Blood pressure is stable. He will continue to take medications as prescribed, to decrease high sodium intake, excessive alcohol intake, increase potassium intake, smoking cessation, and increase physical activity of at least 30 minutes of cardio activity daily. He will continue to follow Heart Healthy or DASH diet. - amLODipine (NORVASC) 5 MG tablet; Take 1 tablet (5 mg total) by mouth daily.  Dispense: 30 tablet; Refill: 6  3. Acute gout, unspecified cause, unspecified site - allopurinol (ZYLOPRIM) 100 MG tablet; Take 1 tablet (100 mg total) by mouth daily.  Dispense: 30 tablet; Refill: 6  4. Osteoarthritis of left knee, unspecified osteoarthritis type - ibuprofen (ADVIL) 800 MG tablet; Take 1 tablet (800 mg total) by mouth every 8 (eight) hours as needed.  Dispense: 30 tablet; Refill:  6  5. Dyslipidemia - rosuvastatin (CRESTOR) 20 MG tablet; Take 1 tablet (20 mg total) by mouth daily at 6 PM.  Dispense: 30 tablet; Refill: 6  6. Tobacco use  7. Encounter for smoking cessation counseling We will  initiate Chantix today.  - varenicline (CHANTIX STARTING MONTH PAK) 0.5 MG X 11 & 1 MG X 42 tablet; Take one 0.5 mg tablet by mouth once daily for 3 days, then increase to one 0.5 mg tablet twice daily for 4 days, then increase to one 1 mg tablet twice daily.  Dispense: 53 tablet; Refill: 0  8. Health [facility] maintenance - POCT glycosylated hemoglobin (Hb A1C) - POCT urinalysis dipstick - POCT glucose (manual entry) - CBC with Differential - Comprehensive metabolic panel - Lipid Panel - TSH - Vitamin B12 - Vitamin D, 25-hydroxy  9. Follow up He will follow up in 6 months.   Meds ordered this encounter  Medications  . varenicline (CHANTIX STARTING MONTH PAK) 0.5 MG X 11 & 1 MG X 42 tablet    Sig: Take one 0.5 mg tablet by mouth once daily for 3 days, then increase to one 0.5 mg tablet twice daily for 4 days, then increase to one 1 mg tablet twice daily.    Dispense:  53 tablet    Refill:  0  . allopurinol (ZYLOPRIM) 100 MG tablet    Sig: Take 1 tablet (100 mg total) by mouth daily.    Dispense:  30 tablet    Refill:  6  . amLODipine (NORVASC) 5 MG tablet    Sig: Take 1 tablet (5 mg total) by mouth daily.    Dispense:  30 tablet    Refill:  6  . aspirin EC 81 MG tablet    Sig: Take 1 tablet (81 mg total) by mouth daily.    Dispense:  30 tablet    Refill:  6  . DISCONTD: Colchicine (MITIGARE) 0.6 MG CAPS    Sig: Take 0.6 mg by mouth 2 (two) times daily as needed (for gouty flare ups).    Dispense:  30 capsule    Refill:  6  . ibuprofen (ADVIL) 800 MG tablet    Sig: Take 1 tablet (800 mg total) by mouth every 8 (eight) hours as needed.    Dispense:  30 tablet    Refill:  6  . rosuvastatin (CRESTOR) 20 MG tablet    Sig: Take 1 tablet (20 mg total) by mouth daily at 6 PM.    Dispense:  30 tablet    Refill:  6    Orders Placed This Encounter  Procedures  . CBC with Differential  . Comprehensive metabolic panel  . Lipid Panel  . TSH  . Vitamin B12  . Vitamin D, 25-hydroxy   . POCT glycosylated hemoglobin (Hb A1C)  . POCT urinalysis dipstick  . POCT glucose (manual entry)    Referral Orders  No referral(s) requested today    Raliegh Ip,  MSN, FNP-BC [facility] [address] [place], [place] [address] [phone] fax   Problem List Items Addressed This Visit      Musculoskeletal and Integument   Chronic gout of right foot - Primary   Relevant Medications   allopurinol (ZYLOPRIM) 100 MG tablet   aspirin EC 81 MG tablet   ibuprofen (ADVIL) 800 MG tablet   Osteoarthritis of left knee   Relevant Medications   allopurinol (ZYLOPRIM) 100 MG  tablet   aspirin EC 81 MG tablet   ibuprofen (ADVIL) 800 MG tablet     Other   Dyslipidemia   Relevant Medications   rosuvastatin (CRESTOR) 20 MG tablet    Other Visit Diagnoses    Hypertension, unspecified type       Relevant Medications   amLODipine (NORVASC) 5 MG tablet   aspirin EC 81 MG tablet   rosuvastatin (CRESTOR) 20 MG tablet   Acute gout, unspecified cause, unspecified site       Relevant Medications   allopurinol (ZYLOPRIM) 100 MG tablet   Tobacco use       Encounter for smoking cessation counseling       Relevant Medications   varenicline (CHANTIX STARTING MONTH PAK) 0.5 MG X 11 & 1 MG X 42 tablet   Health [facility] maintenance       Relevant Orders   POCT glycosylated hemoglobin (Hb A1C) (Completed)   POCT urinalysis dipstick (Completed)   POCT glucose (manual entry) (Completed)   CBC with Differential (Completed)   Comprehensive metabolic panel (Completed)   Lipid Panel (Completed)   TSH (Completed)   Vitamin B12 (Completed)   Vitamin D, 25-hydroxy (Completed)   Follow up          Meds ordered this encounter  Medications  . varenicline (CHANTIX STARTING MONTH PAK) 0.5 MG X 11 & 1 MG X 42 tablet    Sig: Take one 0.5 mg tablet by mouth once daily for 3 days, then increase to one 0.5 mg tablet  twice daily for 4 days, then increase to one 1 mg tablet twice daily.    Dispense:  53 tablet    Refill:  0  . allopurinol (ZYLOPRIM) 100 MG tablet    Sig: Take 1 tablet (100 mg total) by mouth daily.    Dispense:  30 tablet    Refill:  6  . amLODipine (NORVASC) 5 MG tablet    Sig: Take 1 tablet (5 mg total) by mouth daily.    Dispense:  30 tablet    Refill:  6  . aspirin EC 81 MG tablet    Sig: Take 1 tablet (81 mg total) by mouth daily.    Dispense:  30 tablet    Refill:  6  . DISCONTD: Colchicine (MITIGARE) 0.6 MG CAPS    Sig: Take 0.6 mg by mouth 2 (two) times daily as needed (for gouty flare ups).    Dispense:  30 capsule    Refill:  6  . ibuprofen (ADVIL) 800 MG tablet    Sig: Take 1 tablet (800 mg total) by mouth every 8 (eight) hours as needed.    Dispense:  30 tablet    Refill:  6  . rosuvastatin (CRESTOR) 20 MG tablet    Sig: Take 1 tablet (20 mg total) by mouth daily at 6 PM.    Dispense:  30 tablet    Refill:  6    Follow-up: Return in about 6 months (around 04/11/2020).    Kallie Locks, FNP

## 2019-10-10 NOTE — Telephone Encounter (Signed)
MEDICAID DOES NOT COVER COLCHICINE-CAN YOU RESEND SCRIPT FOR PT FOR THE MITIGARE PLEASE.  MITIGARE IS PREFERRED UNDER MEDICAID

## 2019-10-11 LAB — COMPREHENSIVE METABOLIC PANEL
ALT: 14 IU/L (ref 0–44)
AST: 25 IU/L (ref 0–40)
Albumin/Globulin Ratio: 1.9 (ref 1.2–2.2)
Albumin: 5.1 g/dL — ABNORMAL HIGH (ref 4.0–5.0)
Alkaline Phosphatase: 64 IU/L (ref 39–117)
BUN/Creatinine Ratio: 9 (ref 9–20)
BUN: 9 mg/dL (ref 6–24)
Bilirubin Total: 0.3 mg/dL (ref 0.0–1.2)
CO2: 20 mmol/L (ref 20–29)
Calcium: 10 mg/dL (ref 8.7–10.2)
Chloride: 102 mmol/L (ref 96–106)
Creatinine, Ser: 1.02 mg/dL (ref 0.76–1.27)
GFR calc Af Amer: 99 mL/min/{1.73_m2} (ref >59)
GFR calc non Af Amer: 85 mL/min/{1.73_m2} (ref >59)
Globulin, Total: 2.7 g/dL (ref 1.5–4.5)
Glucose: 91 mg/dL (ref 65–99)
Potassium: 4.1 mmol/L (ref 3.5–5.2)
Sodium: 138 mmol/L (ref 134–144)
Total Protein: 7.8 g/dL (ref 6.0–8.5)

## 2019-10-11 LAB — LIPID PANEL
Chol/HDL Ratio: 3 ratio (ref 0.0–5.0)
Cholesterol, Total: 121 mg/dL (ref 100–199)
HDL: 41 mg/dL (ref >39)
LDL Chol Calc (NIH): 57 mg/dL (ref 0–99)
Triglycerides: 131 mg/dL (ref 0–149)
VLDL Cholesterol Cal: 23 mg/dL (ref 5–40)

## 2019-10-11 LAB — CBC WITH DIFFERENTIAL/PLATELET
Basophils Absolute: 0 10*3/uL (ref 0.0–0.2)
Basos: 0 %
EOS (ABSOLUTE): 0 10*3/uL (ref 0.0–0.4)
Eos: 0 %
Hematocrit: 45.2 % (ref 37.5–51.0)
Hemoglobin: 15.5 g/dL (ref 13.0–17.7)
Immature Grans (Abs): 0 10*3/uL (ref 0.0–0.1)
Immature Granulocytes: 0 %
Lymphocytes Absolute: 1.6 10*3/uL (ref 0.7–3.1)
Lymphs: 18 %
MCH: 31.4 pg (ref 26.6–33.0)
MCHC: 34.3 g/dL (ref 31.5–35.7)
MCV: 92 fL (ref 79–97)
Monocytes Absolute: 0.5 10*3/uL (ref 0.1–0.9)
Monocytes: 6 %
Neutrophils Absolute: 6.5 10*3/uL (ref 1.4–7.0)
Neutrophils: 76 %
Platelets: 276 10*3/uL (ref 150–450)
RBC: 4.94 x10E6/uL (ref 4.14–5.80)
RDW: 14 % (ref 11.6–15.4)
WBC: 8.6 10*3/uL (ref 3.4–10.8)

## 2019-10-11 LAB — TSH: TSH: 1.65 u[IU]/mL (ref 0.450–4.500)

## 2019-10-11 LAB — VITAMIN D 25 HYDROXY (VIT D DEFICIENCY, FRACTURES): Vit D, 25-Hydroxy: 12.2 ng/mL — ABNORMAL LOW (ref 30.0–100.0)

## 2019-10-11 LAB — VITAMIN B12: Vitamin B-12: 445 pg/mL (ref 232–1245)

## 2019-10-11 MED FILL — MITIGARE 0.6 MG CAPSULE: 0.6 | 15 days supply | Qty: 30 | Fill #0

## 2019-10-16 ENCOUNTER — Other Ambulatory Visit: Payer: Self-pay | Admitting: Family Medicine

## 2019-10-16 ENCOUNTER — Encounter: Payer: Self-pay | Admitting: Family Medicine

## 2019-10-16 DIAGNOSIS — E559 Vitamin D deficiency, unspecified: Secondary | ICD-10-CM

## 2019-10-16 MED ORDER — VITAMIN D (ERGOCALCIFEROL) 1.25 MG (50000 UNIT) PO CAPS: 50000.0000 [IU] | ORAL_CAPSULE | ORAL | 6 refills | Status: DC

## 2019-10-17 ENCOUNTER — Telehealth: Payer: Self-pay

## 2019-10-17 NOTE — Telephone Encounter (Signed)
Manuel Harrison is aware of his lab results. He has been advised to call his Cardiologist [phone]) for a follow up appointment.

## 2019-11-01 MED FILL — ROSUVASTATIN CALCIUM 20 MG: 20 | 90 days supply | Qty: 90 | Fill #4

## 2019-11-01 MED FILL — AMLODIPINE BESYLATE 5 MG TA: 5 | 90 days supply | Qty: 90 | Fill #1

## 2019-11-03 IMAGING — CT CT HEAD CODE STROKE
4 series · 15 of 47 positions shown, 17 images · non-contrast
Comparison: MRI head October 06, 2012

CLINICAL DATA: Code stroke. LEFT-sided weakness, slurred speech.
History of stroke and hypertension.

EXAM:
CT HEAD WITHOUT CONTRAST
TECHNIQUE: Contiguous axial images were obtained from the base of the skull
through the vertex without intravenous contrast.

[Series 3: head wo · axial · 0.42mm/px · z∈[-48,+62]mm · 7 of 30 slices shown, 9 images]
[im 4/30  brain]
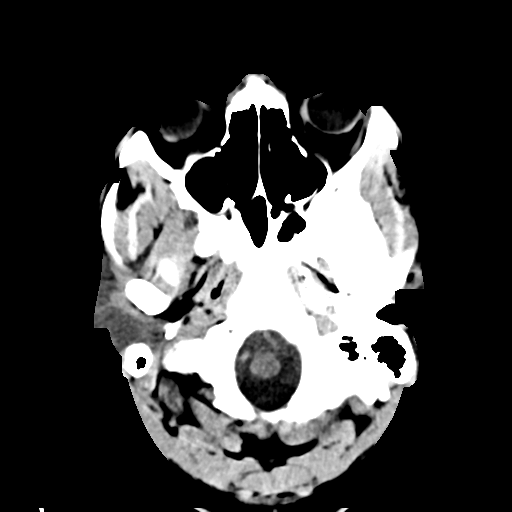
[im 4/30  bone]
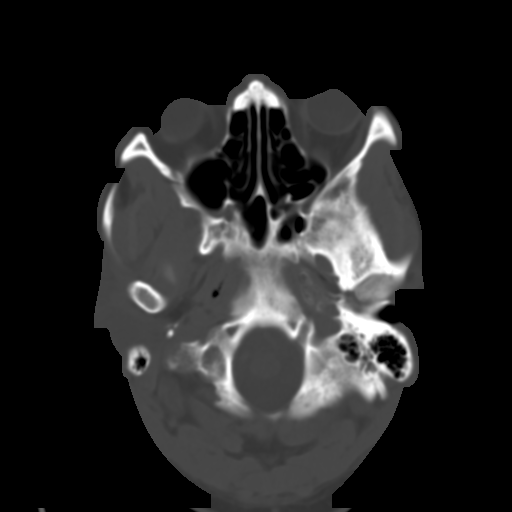
[im 8/30  brain]
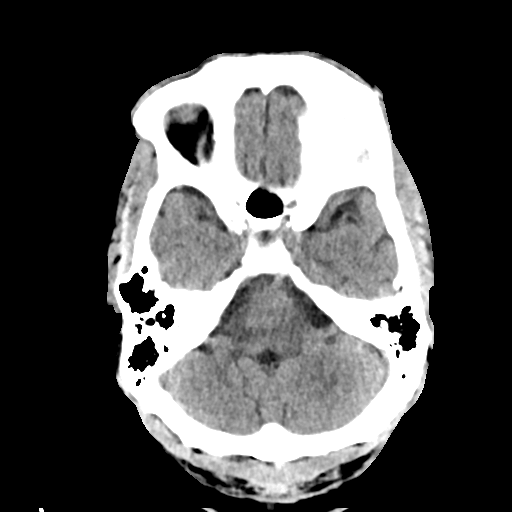
[im 11/30  brain]
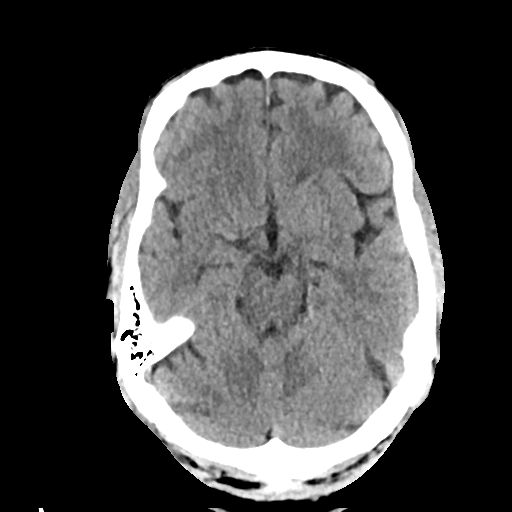
[im 15/30  brain]
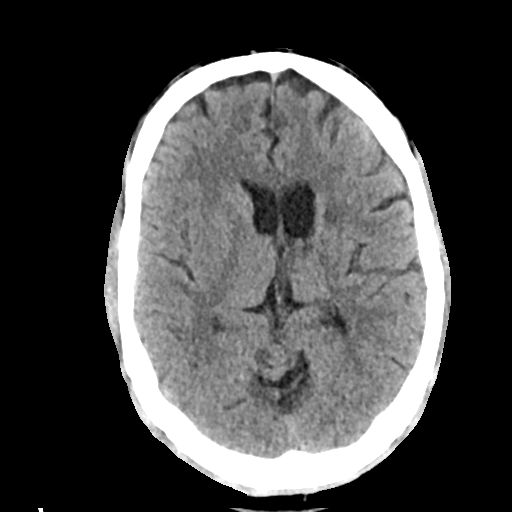
[im 19/30  brain]
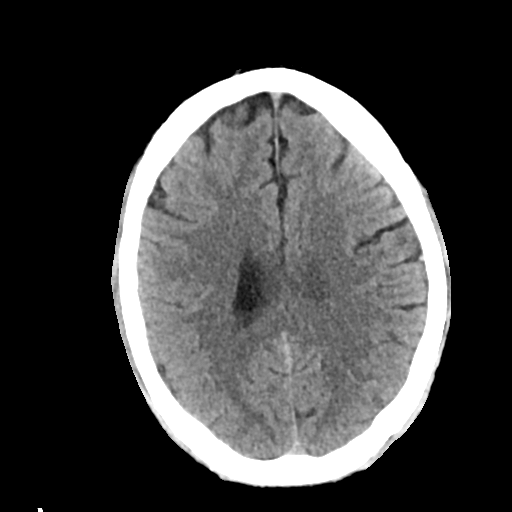
[im 19/30  bone]
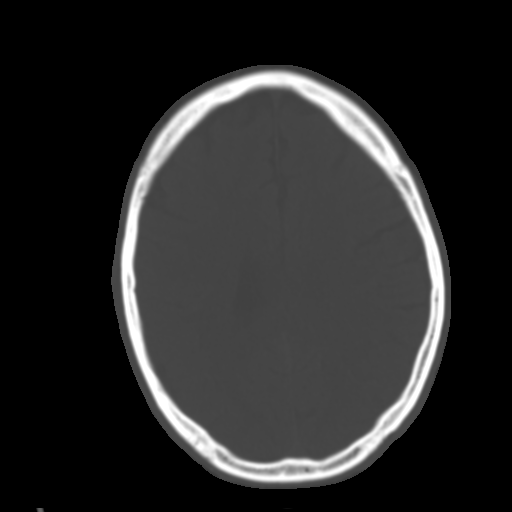
[im 22/30  brain]
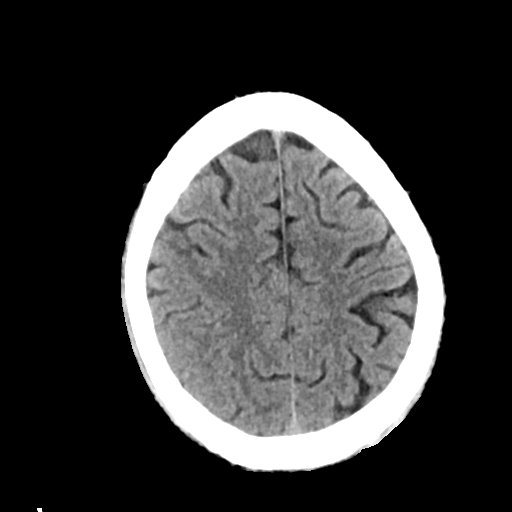
[im 26/30  brain]
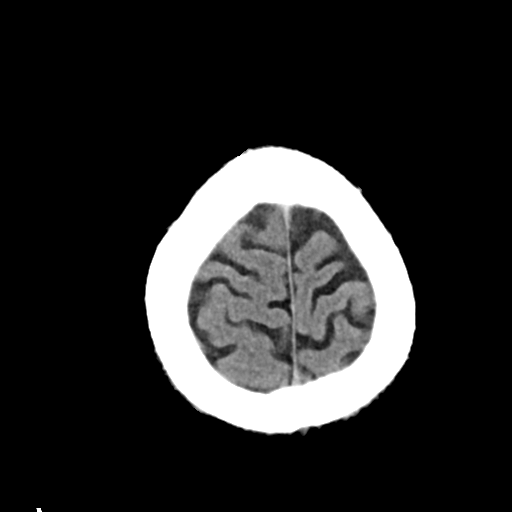

[Series 4: head bone · axial · 0.42mm/px · z∈[-49,-35]mm · 2 of 74 slices shown]
[im 8/74  bone]
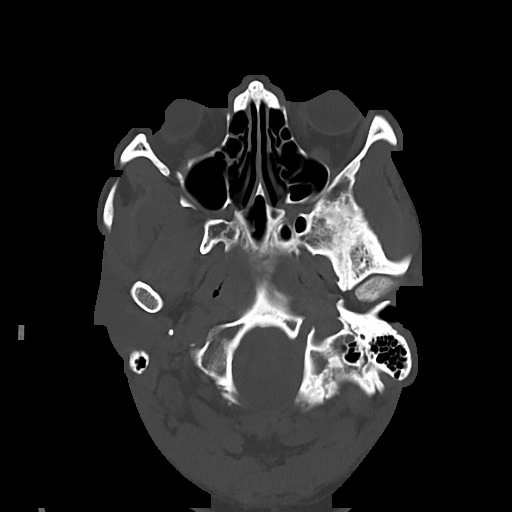
[im 15/74  bone]
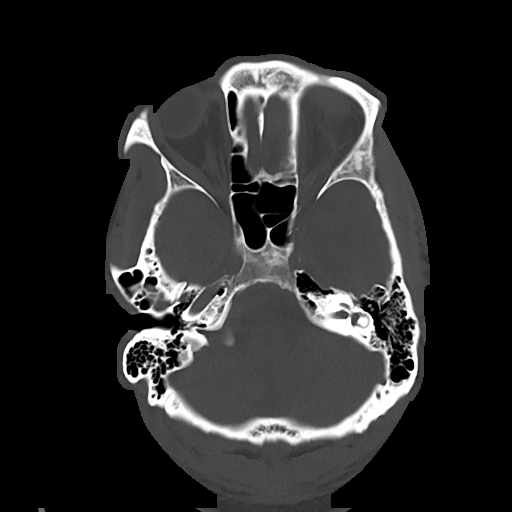

[Series 5: cor soft · coronal · 0.29mm/px · 3 of 67 slices shown]
[im 23/67  brain]
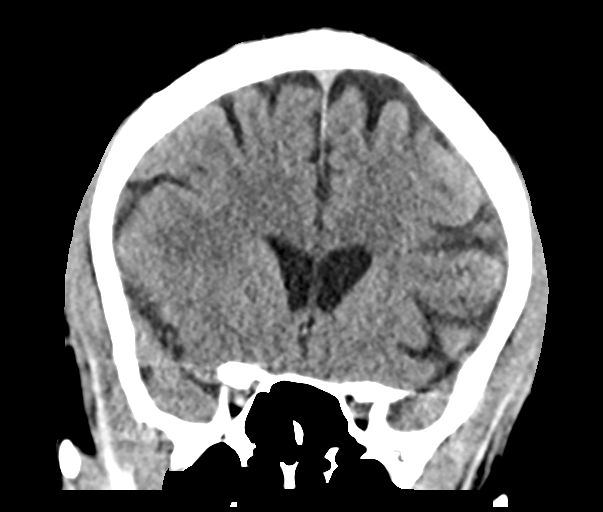
[im 30/67  brain]
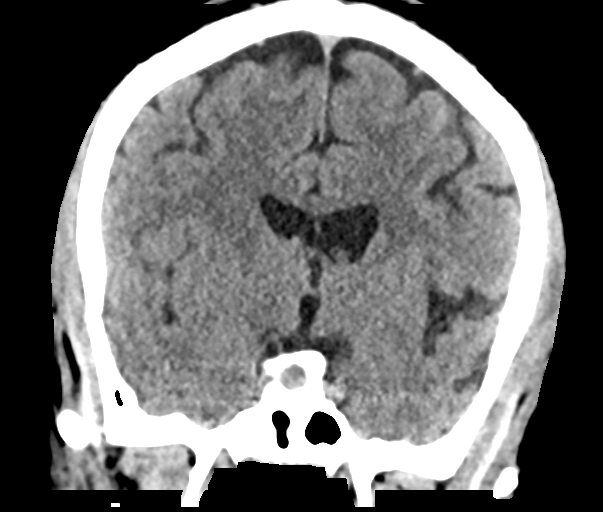
[im 37/67  brain]
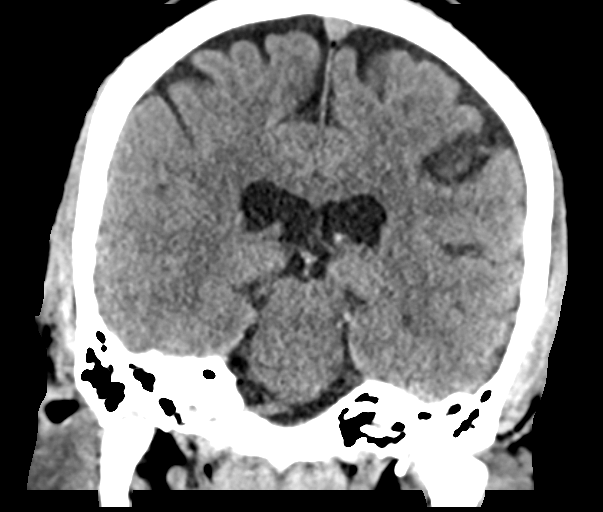

[Series 6: sag soft · sagittal · 0.29mm/px · 3 of 55 slices shown]
[im 19/55  brain]
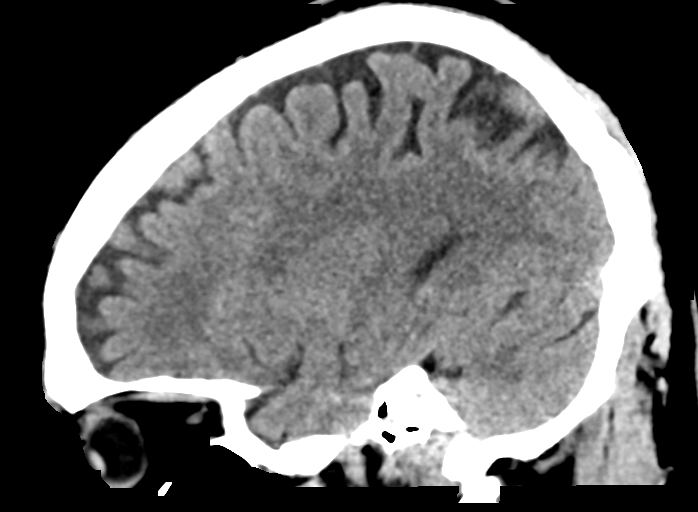
[im 28/55  brain]
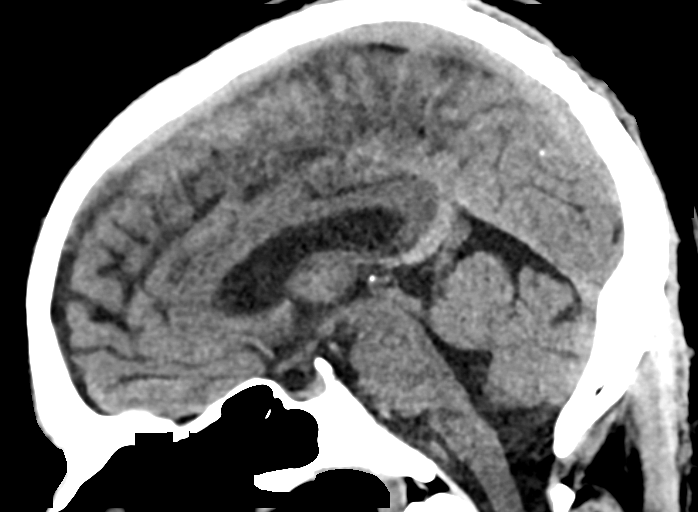
[im 37/55  brain]
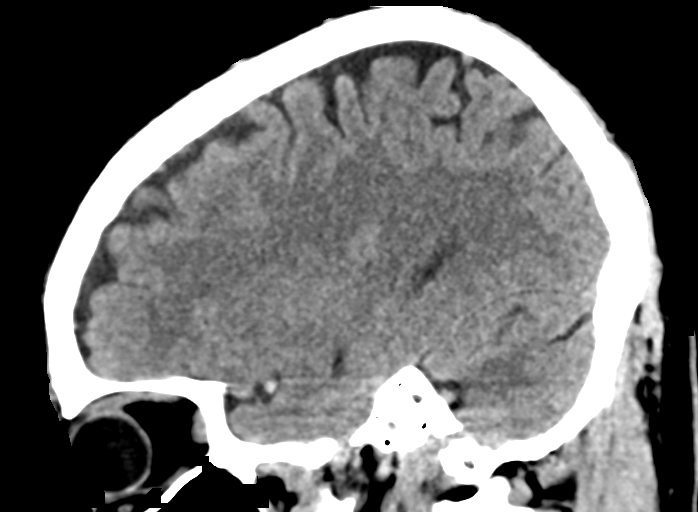

[15 of 47 positions shown; findings below may reference images not displayed]

FINDINGS: BRAIN: No intraparenchymal hemorrhage, mass effect nor midline
shift. Old LEFT basal ganglia infarct with ex vacuo dilatation LEFT
lateral ventricle. Focal blurring RIGHT frontal gray-white matter
junction. No abnormal extra-axial fluid collections. Basal cisterns
are patent.

VASCULAR: Mildly dense RIGHT MCA versus artifact.

SKULL/SOFT TISSUES: No skull fracture. No significant soft tissue
swelling.

ORBITS/SINUSES: The included ocular globes and orbital contents are
normal.Trace paranasal sinus mucosal thickening. Mastoid air cells
are well aerated.

OTHER: None.

ASPECTS (Alberta Stroke Program Early CT Score)

- Ganglionic level infarction (caudate, lentiform nuclei, internal
capsule, insula, M1-M3 cortex): 6

- Supraganglionic infarction (M4-M6 cortex): 3

Total score (0-10 with 10 being normal): 9
IMPRESSION: 1. Mildly dense RIGHT MCA concerning for thromboembolism.
2. Small age-indeterminate RIGHT frontal infarct, new from [AGE] LEFT basal ganglia infarct.
4. ASPECTS is 9.
5. Critical Value/emergent results text paged to Dr.JESUS FCO, Neurology
via [employer] secure system on 04/25/2018 at [DATE], including
interpreting physician's phone number.

## 2019-11-04 IMAGING — DX DG CHEST 1V PORT
1 series · 1 of 1 positions shown · non-contrast
Comparison: Portable chest x-ray of 04/25/2018 and two-view chest
x-ray of 10/06/2012

CLINICAL DATA: Acute respiratory failure, hypoxia, history of
stroke

EXAM:
PORTABLE CHEST 1 VIEW

[chest ap]
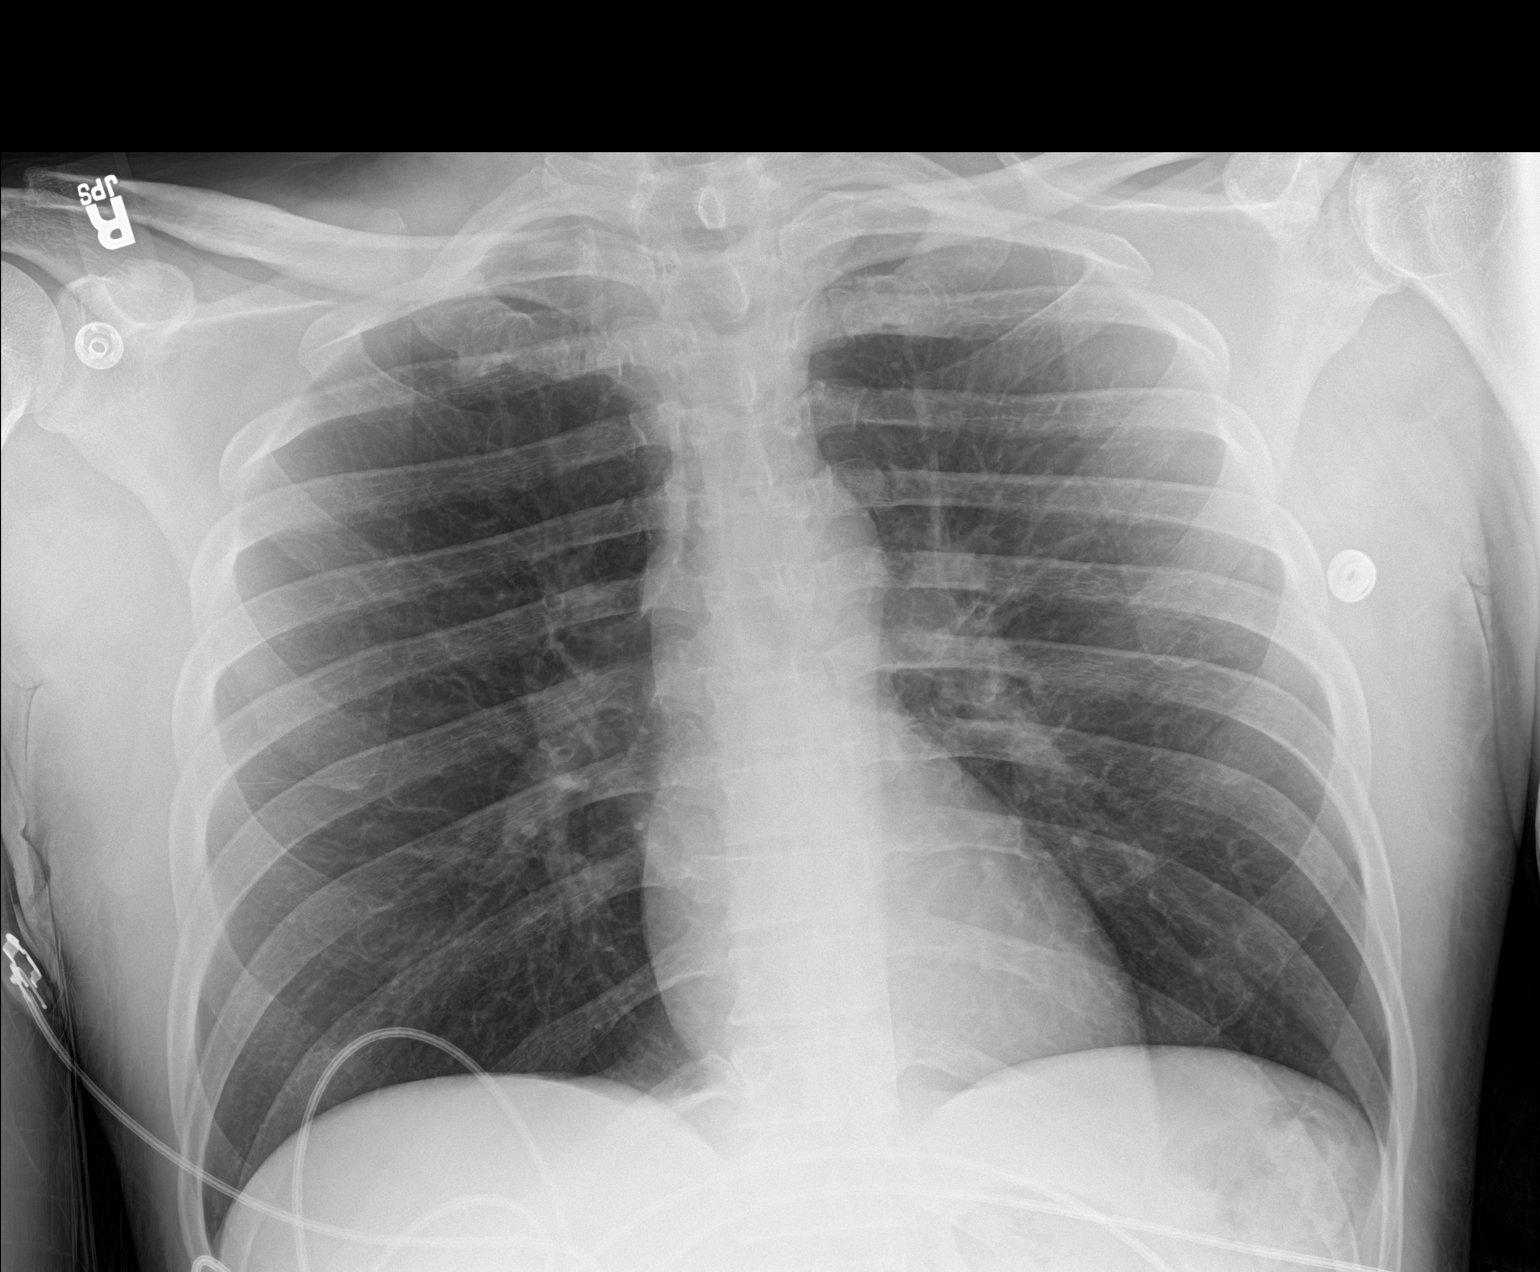

[1 of 1 positions shown; findings below may reference images not displayed]

FINDINGS: The endotracheal tube has been removed as is the NG tube. The lungs
appear well aerated. Mediastinal and hilar contours are unremarkable
and heart size is stable. No bony abnormality is seen.
IMPRESSION: 1. Removal of endotracheal tube and NG tube.
2. Improved aeration.  No active lung disease.

## 2019-11-05 ENCOUNTER — Ambulatory Visit (INDEPENDENT_AMBULATORY_CARE_PROVIDER_SITE_OTHER): Payer: Medicaid Other | Admitting: *Deleted

## 2019-11-05 ENCOUNTER — Telehealth: Payer: Self-pay | Admitting: *Deleted

## 2019-11-05 DIAGNOSIS — I63511 Cerebral infarction due to unspecified occlusion or stenosis of right middle cerebral artery: Secondary | ICD-10-CM

## 2019-11-05 LAB — CUP PACEART REMOTE DEVICE CHECK
Date Time Interrogation Session: 20210411033337
Implantable Pulse Generator Implant Date: 20191004

## 2019-11-05 NOTE — Telephone Encounter (Signed)
LINQ Summary Report shows elevated HR on presenting ECG from 10/24/19. More recent presenting ECG not available. Will request manual transmission for review.

## 2019-11-06 NOTE — Progress Notes (Signed)
ILR Remote 

## 2019-11-13 NOTE — Telephone Encounter (Signed)
Spoke with patient. He reports his mother currently has his monitor, hence why it has been disconnected since 10/24/19. Pt agrees to obtain monitor and send a manual transmission within the next day or two. Pt denies questions or concerns at this time.

## 2019-11-23 ENCOUNTER — Telehealth: Payer: Self-pay

## 2019-11-23 NOTE — Telephone Encounter (Signed)
Linq alert- Tachy events 3/31 and 4/26.  Pt had previously been contacted for similar events, needs OV with APP to assess condition.  Attempted to reach pt, no DPR on file, left generic message on identified VM requesting callback.

## 2019-11-23 NOTE — Telephone Encounter (Signed)
Pt returned phone call.  Scheduled pt for OV with AT on 5/5.  Advised we need in person assessment based on data we are seeing on his device.

## 2019-11-23 NOTE — Telephone Encounter (Signed)
See phone note from 11/23/2019.

## 2019-11-28 ENCOUNTER — Ambulatory Visit (INDEPENDENT_AMBULATORY_CARE_PROVIDER_SITE_OTHER): Payer: Medicaid Other | Admitting: Student

## 2019-11-28 ENCOUNTER — Encounter: Payer: Self-pay | Admitting: Student

## 2019-11-28 ENCOUNTER — Other Ambulatory Visit: Payer: Self-pay

## 2019-11-28 VITALS — BP 130/80 | HR 89 | Ht 68.0 in | Wt 163.0 lb

## 2019-11-28 DIAGNOSIS — I471 Supraventricular tachycardia: Secondary | ICD-10-CM

## 2019-11-28 DIAGNOSIS — I10 Essential (primary) hypertension: Secondary | ICD-10-CM

## 2019-11-28 DIAGNOSIS — Z8673 Personal history of transient ischemic attack (TIA), and cerebral infarction without residual deficits: Secondary | ICD-10-CM | POA: Diagnosis not present

## 2019-11-28 LAB — CUP PACEART INCLINIC DEVICE CHECK
Date Time Interrogation Session: 20210505144101
Implantable Pulse Generator Implant Date: 20191004

## 2019-11-28 MED ORDER — METOPROLOL SUCCINATE ER 25 MG PO TB24: 25.0000 mg | ORAL_TABLET | Freq: Every day | ORAL | 3 refills | Status: DC

## 2019-11-28 NOTE — Progress Notes (Addendum)
Electrophysiology Office Note Date: 11/28/2019  ID:  Manuel Harrison, DOB 03-Oct-1970, MRN [id number]  PCP: Kallie Locks, FNP Primary Cardiologist: No primary care provider on file. Electrophysiologist: Lewayne Bunting, MD   CC: ILR follow-up  Manuel Harrison is a 49 y.o. male seen today for Dr. Ladona Ridgel . he presents today for routine electrophysiology followup.  Since last being seen in our clinic, the patient reports doing very well. He has had episodes of rapid HRs that appear to be SVT on device. Short, overall low burden, but most recently had what appeared to be rates as high as 250 bpm, concerning for VT. He denies chest pain, palpitations, dyspnea, PND, orthopnea, nausea, vomiting, dizziness, syncope, edema, weight gain, or early satiety. He drinks one beer or more most days, and smokes 10 cigarettes (nearly a pack) per day. He uses marijuana but denies any IV or stimulant drug use.   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 Arthritis    "left knee" (04/26/2018)  . Facial fracture (HCC) 01/2019  . Gout 03/2019  . High cholesterol   . History of gout   . Hypertension   . Stroke [facility]) 09/2012   "right side is weaker since" (10//08/2017)  . Tobacco use 03/2019  . Vitamin D deficiency 09/2019   Past Surgical History:  Procedure Laterality Date  . IR CT HEAD LTD  04/25/2018  . IR INTRA CRAN STENT  04/25/2018  . IR PERCUTANEOUS ART THROMBECTOMY/INFUSION INTRACRANIAL INC DIAG ANGIO  04/25/2018  . LOOP RECORDER INSERTION N/A 04/28/2018   Procedure: LOOP RECORDER INSERTION;  Surgeon: Marinus Maw, MD;  Location: [facility] INVASIVE CV LAB;  Service: Cardiovascular;  Laterality: N/A;  . RADIOLOGY WITH ANESTHESIA N/A 04/25/2018   Procedure: RADIOLOGY WITH ANESTHESIA;  Surgeon: Julieanne Cotton, MD;  Location: [facility] OR;   Service: Radiology;  Laterality: N/A;  . TEE WITHOUT CARDIOVERSION N/A 04/28/2018   Procedure: TRANSESOPHAGEAL ECHOCARDIOGRAM (TEE);  Surgeon: Jodelle Red, MD;  Location: [facility] ENDOSCOPY;  Service: Cardiovascular;  Laterality: N/A;    Current Outpatient Medications  Medication Sig Dispense Refill  . allopurinol (ZYLOPRIM) 100 MG tablet Take 1 tablet (100 mg total) by mouth daily. 30 tablet 6  . amLODipine (NORVASC) 5 MG tablet Take 1 tablet (5 mg total) by mouth daily. 30 tablet 6  . aspirin EC 81 MG tablet Take 1 tablet (81 mg total) by mouth daily. 30 tablet 6  . Colchicine (MITIGARE) 0.6 MG CAPS Take 0.6 mg by mouth 2 (two) times daily as needed (for acute gouty attacks.). 30 capsule 3  . ibuprofen (ADVIL) 800 MG tablet Take 1 tablet (800 mg total) by mouth every 8 (eight) hours as needed. 30 tablet 6  . rosuvastatin (CRESTOR) 20 MG tablet Take 1 tablet (20 mg total) by mouth daily at 6 PM. 30 tablet 6  . ticagrelor (BRILINTA) 90 MG TABS tablet Take 90 mg by mouth 2 (two) times daily.    . varenicline (CHANTIX STARTING MONTH PAK) 0.5 MG X 11 & 1 MG X 42 tablet Take one 0.5 mg tablet by mouth once daily for 3 days, then increase to one 0.5 mg tablet twice daily for 4 days, then increase to one 1 mg tablet twice daily. 53 tablet 0  . Vitamin D, Ergocalciferol, (DRISDOL) 1.25 MG (50000 UNIT) CAPS capsule Take 1  capsule (50,000 Units total) by mouth every 7 (seven) days. 5 capsule 6  . metoprolol succinate (TOPROL-XL) 25 MG 24 hr tablet Take 1 tablet (25 mg total) by mouth daily. Take with or immediately following a meal. 90 tablet 3   No current facility-administered medications for this visit.    Allergies:   Aleve [naproxen sodium]   Social History: Social History   Socioeconomic History  . Marital status: Single    Spouse name: Not on file  . Number of children: Not on file  . Years of education: Not on file  . Highest education level: Not on file  Occupational History  .  Not on file  Tobacco Use  . Smoking status: Current Every Day Smoker    Packs/day: 2.00    Years: 32.00    Pack years: 64.00    Types: Cigarettes  . Smokeless tobacco: Never Used  Substance and Sexual Activity  . Alcohol use: Yes    Alcohol/week: 47.0 standard drinks    Types: 47 Cans of beer per week    Comment: 04/26/2018 "2- 40 ounce cans/day"  . Drug use: Yes    Types: Marijuana    Comment: 04/26/2018 "nothing regular; might smoke weekly, if that"  . Sexual activity: Yes  Other Topics Concern  . Not on file  Social History Narrative  . Not on file   Social Determinants of Health   Financial Resource Strain:   . Difficulty of Paying Living Expenses:   Food Insecurity:   . Worried About Programme researcher, broadcasting/film/video in the Last Year:   . Barista in the Last Year:   Transportation Needs:   . Freight forwarder (Medical):   [name] Lack of Transportation (Non-Medical):   Physical Activity:   . Days of Exercise per Week:   . Minutes of Exercise per Session:   Stress:   . Feeling of Stress :   Social Connections:   . Frequency of Communication with Friends and Family:   . Frequency of Social Gatherings with Friends and Family:   . Attends Religious Services:   . Active Member of Clubs or Organizations:   . Attends Banker Meetings:   [name] Marital Status:   Intimate Partner Violence:   . Fear of Current or Ex-Partner:   . Emotionally Abused:   [name] Physically Abused:   . Sexually Abused:     Family History: Family History  Problem Relation Age of Onset  . Other Mother        no health concerns  . Other Father        no health concerns     Review of Systems: All other systems reviewed and are otherwise negative except as noted above.  Physical Exam: Vitals:   11/28/19 1151  BP: 130/80  Pulse: 89  SpO2: 98%  Weight: 163 lb (73.9 kg)  Height: 5\' 8"  (1.727 m)     GEN- The patient is well appearing, alert and oriented x 3 today.   HEENT:  normocephalic, atraumatic; sclera clear, conjunctiva pink; hearing intact; oropharynx clear; neck supple  Lungs- Clear to ausculation bilaterally, normal work of breathing.  No wheezes, rales, rhonchi Heart- Regular rate and rhythm, no murmurs, rubs or gallops  GI- soft, non-tender, non-distended, bowel sounds present  Extremities- no clubbing, cyanosis, or edema  MS- no significant deformity or atrophy Skin- warm and dry, no rash or lesion; PPM pocket well healed Psych- euthymic mood, full affect Neuro- strength and  sensation are intact  PPM Interrogation- reviewed in detail today,  See PACEART report  EKG:  EKG is ordered today. The ekg ordered today shows NSR with normal intervals  Recent Labs: 10/10/2019: ALT 14; BUN 9; Creatinine, Ser 1.02; Hemoglobin 15.5; Platelets 276; Potassium 4.1; Sodium 138; TSH 1.650   Wt Readings from Last 3 Encounters:  11/28/19 163 lb (73.9 kg)  10/10/19 165 lb 6.4 oz (75 kg)  04/16/19 170 lb (77.1 kg)     Other studies Reviewed: Additional studies/ records that were reviewed today include: Echo 04/2018 shows LVEF 60-65%, Previous EP office notes, Previous remote checks, Most recent labwork.   Assessment and Plan:  1. Cryptogenic Stroke s/p Medtronic Loop recorder Normal device function See Pace Art report No changes today No true AF. SVT has been noted.  2. Tachycardia Previously with SVT, more recently with what appears to be a WCT with a rate of up to 250 bpm concerning for VT.  Will start Toprol XL 25 mg daily. (for once daily use) Labwork today.  Pt denies symptoms.  Normal EF 04/2018.  Lowered tachy detection to 167 bpm in attempt to get better on/off for some of his episodes.   Current medicines are reviewed at length with the patient today.   The patient does not have concerns regarding his medicines.  The following changes were made today:  Added toprol XL as above.  Labs/ tests ordered today include:  Orders Placed This  Encounter  Procedures  . Basic Metabolic Panel (BMET)  . Magnesium  . EKG 12-Lead     Disposition:   Follow up with Dr. Lovena Le in 6 Months. Sooner pending thoughts on device interrogation.    Jacalyn Lefevre, PA-C  11/28/2019 2:43 PM  [address] [place] [address] [phone] (office) [phone] (fax)

## 2019-11-28 NOTE — Patient Instructions (Addendum)
Medication Instructions:  START METOPROLOL SUCCINATE (TOPROL) 25 mg Daily *If you need a refill on your cardiac medications before your next appointment, please call your pharmacy*   Lab Work:  TODAY BMET MAGNESIUM If you have labs (blood work) drawn today and your tests are completely normal, you will receive your results only by: [name] MyChart Message (if you have MyChart) OR . A paper copy in the mail If you have any lab test that is abnormal or we need to change your treatment, we will call you to review the results.   Testing/Procedures: none   Follow-Up: 6 months with Dr Ladona Ridgel At [facility], you and your health needs are our priority.  As part of our continuing mission to provide you with exceptional heart care, we have created designated Provider Care Teams.  These Care Teams include your primary Cardiologist (physician) and Advanced Practice Providers (APPs -  Physician Assistants and Nurse Practitioners) who all work together to provide you with the care you need, when you need it.  We recommend signing up for the patient portal called "MyChart".  Sign up information is provided on this After Visit Summary.  MyChart is used to connect with patients for Virtual Visits (Telemedicine).  Patients are able to view lab/test results, encounter notes, upcoming appointments, etc.  Non-urgent messages can be sent to your provider as well.   To learn more about what you can do with MyChart, go to [email or web address].     Other Instructions Metoprolol Extended-Release Tablets What is this medicine? METOPROLOL (me TOE proe lole) is a beta blocker. It decreases the amount of work your heart has to do and helps your heart beat regularly. It treats high blood pressure and/or prevent chest pain (also called angina). It also treats heart failure. This medicine may be used for other purposes; ask your health care provider or pharmacist if you have questions. COMMON BRAND NAME(S): toprol,  Toprol XL What should I tell my health care provider before I take this medicine? They need to know if you have any of these conditions:  diabetes  heart or vessel disease like slow heart rate, worsening heart failure, heart block, sick sinus syndrome or Raynaud's disease  kidney disease  liver disease  lung or breathing disease, like asthma or emphysema  pheochromocytoma  thyroid disease  an unusual or allergic reaction to metoprolol, other beta-blockers, medicines, foods, dyes, or preservatives  pregnant or trying to get pregnant  breast-feeding How should I use this medicine? Take this drug by mouth. Take it as directed on the prescription label at the same time every day. Take it with food. You may cut the tablet in half if it is scored (has a line in the middle of it). This may help you swallow the tablet if the whole tablet is too big. Be sure to take both halves. Do not take just one-half of the tablet. Keep taking it unless your health care provider tells you to stop. Talk to your health care provider about the use of this drug in children. While it may be prescribed for children as young as 6 for selected conditions, precautions do apply. Overdosage: If you think you have taken too much of this medicine contact a poison control center or emergency room at once. NOTE: This medicine is only for you. Do not share this medicine with others. What if I miss a dose? If you miss a dose, take it as soon as you can. If it is almost time for  your next dose, take only that dose. Do not take double or extra doses. What may interact with this medicine? This medicine may interact with the following medications:  certain medicines for blood pressure, heart disease, irregular heart beat  certain medicines for depression, like monoamine oxidase (MAO) inhibitors, fluoxetine, or paroxetine  clonidine  dobutamine  epinephrine  isoproterenol  reserpine This list may not describe all  possible interactions. Give your health care provider a list of all the medicines, herbs, non-prescription drugs, or dietary supplements you use. Also tell them if you smoke, drink alcohol, or use illegal drugs. Some items may interact with your medicine. What should I watch for while using this medicine? Visit your doctor or health care professional for regular check ups. Contact your doctor right away if your symptoms worsen. Check your blood pressure and pulse rate regularly. Ask your health care professional what your blood pressure and pulse rate should be, and when you should contact them. You may get drowsy or dizzy. Do not drive, use machinery, or do anything that needs mental alertness until you know how this medicine affects you. Do not sit or stand up quickly, especially if you are an older patient. This reduces the risk of dizzy or fainting spells. Contact your doctor if these symptoms continue. Alcohol may interfere with the effect of this medicine. Avoid alcoholic drinks. This medicine may increase blood sugar. Ask your healthcare provider if changes in diet or medicines are needed if you have diabetes. What side effects may I notice from receiving this medicine? Side effects that you should report to your doctor or health care professional as soon as possible:  allergic reactions like skin rash, itching or hives  cold or numb hands or feet  depression  difficulty breathing  faint  fever with sore throat  irregular heartbeat, chest pain  rapid weight gain   signs and symptoms of high blood sugar such as being more thirsty or hungry or having to urinate more than normal. You may also feel very tired or have blurry vision.  swollen legs or ankles Side effects that usually do not require medical attention (report to your doctor or health care professional if they continue or are bothersome):  anxiety or nervousness  change in sex drive or performance  dry  skin  headache  nightmares or trouble sleeping  short term memory loss  stomach upset or diarrhea This list may not describe all possible side effects. Call your doctor for medical advice about side effects. You may report side effects to FDA at 1-800-FDA-1088. Where should I keep my medicine? Keep out of the reach of children and pets. Store at room temperature between 20 and 25 degrees C (68 and 77 degrees F). Throw away any unused drug after the expiration date. NOTE: This sheet is a summary. It may not cover all possible information. If you have questions about this medicine, talk to your doctor, pharmacist, or health care provider.  2020 Elsevier/Gold Standard (2019-02-22 18:23:00)

## 2019-11-29 ENCOUNTER — Telehealth: Payer: Self-pay | Admitting: Student

## 2019-11-29 LAB — BASIC METABOLIC PANEL
BUN/Creatinine Ratio: 9 (ref 9–20)
BUN: 7 mg/dL (ref 6–24)
CO2: 19 mmol/L — ABNORMAL LOW (ref 20–29)
Calcium: 9.7 mg/dL (ref 8.7–10.2)
Chloride: 105 mmol/L (ref 96–106)
Creatinine, Ser: 0.78 mg/dL (ref 0.76–1.27)
GFR calc Af Amer: 122 mL/min/{1.73_m2} (ref >59)
GFR calc non Af Amer: 105 mL/min/{1.73_m2} (ref >59)
Glucose: 94 mg/dL (ref 65–99)
Potassium: 4.3 mmol/L (ref 3.5–5.2)
Sodium: 140 mmol/L (ref 134–144)

## 2019-11-29 LAB — MAGNESIUM: Magnesium: 1.9 mg/dL (ref 1.6–2.3)

## 2019-11-29 NOTE — Telephone Encounter (Signed)
Seen in office 11/28/2019; ILR for cryptogenic stroke placed 04/28/2018  SVT previously identified; Asymptomatic (This was his first office visit due to difficulty to reach).    Tachy detection rate decreased in attempt to try and identify onset.     Pt with new episode(s) that appear to be VT. Again asymptomatic. Specifically denies chest pain, palpitations, SOB, lightheadedness, dizziness, syncope, or near syncope.  EF normal during CVA work up 04/2018.    Started patient on Toprol XL 25 mg daily and labs drawn. Electrolytes normal. (K 4.3, Mg 1.9)  I let patient know further work up/testing may be required, especially if recurs and/or becomes symptomatic.   Would you recommend any further work up at this time? Or continue to follow for recurrence on new start BB?  Thank you!  Casimiro Needle [address] [place], PA-C  11/29/2019 9:19 AM

## 2019-11-30 NOTE — Telephone Encounter (Signed)
Manuel Harrison was seen by Maxine Glenn PA. Patient has Metoprolol added to his medications. He would you advice. 1- Will it be okay to start?  2-Should he still take the Brilinta?

## 2019-12-01 NOTE — Telephone Encounter (Signed)
Agree with beta blocker. No additional workup. GT

## 2019-12-06 LAB — CUP PACEART REMOTE DEVICE CHECK
Date Time Interrogation Session: 20210512032947
Implantable Pulse Generator Implant Date: 20191004

## 2019-12-09 LAB — CUP PACEART REMOTE DEVICE CHECK
Date Time Interrogation Session: 20210515113148
Implantable Pulse Generator Implant Date: 20191004

## 2019-12-10 ENCOUNTER — Ambulatory Visit (INDEPENDENT_AMBULATORY_CARE_PROVIDER_SITE_OTHER): Payer: Medicaid Other | Admitting: *Deleted

## 2019-12-10 DIAGNOSIS — I63511 Cerebral infarction due to unspecified occlusion or stenosis of right middle cerebral artery: Secondary | ICD-10-CM | POA: Diagnosis not present

## 2019-12-11 NOTE — Progress Notes (Signed)
Carelink Summary Report / Loop Recorder 

## 2019-12-13 ENCOUNTER — Telehealth: Payer: Self-pay

## 2019-12-13 NOTE — Telephone Encounter (Signed)
Received Carelink alert 12/11/19 @ 23:10 for 1 episode of tachycardia arrhythmia that was 176 bpm for 57 seconds. Saw A. Tillery, PA-C 11/28/2019 and given Toprol 25mg  daily to help with rate control. Also lowered tacy detection to 167 bpm in attempt to see onset of episodes. Patient called and assessed, states he has no complaints. Patient reports of medication compliance with the exception of missing him dose this morning.   Routing for further recommendations.

## 2019-12-24 NOTE — Telephone Encounter (Signed)
Tachycardia and noise noted.

## 2020-01-14 ENCOUNTER — Ambulatory Visit (INDEPENDENT_AMBULATORY_CARE_PROVIDER_SITE_OTHER): Payer: Medicaid Other | Admitting: *Deleted

## 2020-01-14 DIAGNOSIS — I639 Cerebral infarction, unspecified: Secondary | ICD-10-CM

## 2020-01-14 LAB — CUP PACEART REMOTE DEVICE CHECK
Date Time Interrogation Session: 20210621002318
Implantable Pulse Generator Implant Date: 20191004

## 2020-01-14 NOTE — Progress Notes (Signed)
Carelink Summary Report / Loop Recorder 

## 2020-02-05 MED FILL — ROSUVASTATIN CALCIUM 20 MG: 20 | 90 days supply | Qty: 90 | Fill #0

## 2020-02-05 MED FILL — IBUPROFEN 800 MG TABLET: 800 | 10 days supply | Qty: 30 | Fill #2

## 2020-02-05 MED FILL — AMLODIPINE BESYLATE 5 MG TA: 5 | 90 days supply | Qty: 90 | Fill #2

## 2020-02-18 ENCOUNTER — Ambulatory Visit (INDEPENDENT_AMBULATORY_CARE_PROVIDER_SITE_OTHER): Payer: Medicaid Other | Admitting: *Deleted

## 2020-02-18 DIAGNOSIS — I63511 Cerebral infarction due to unspecified occlusion or stenosis of right middle cerebral artery: Secondary | ICD-10-CM

## 2020-02-19 LAB — CUP PACEART REMOTE DEVICE CHECK
Date Time Interrogation Session: 20210725231557
Implantable Pulse Generator Implant Date: 20191004

## 2020-02-20 NOTE — Progress Notes (Signed)
Carelink Summary Report / Loop Recorder 

## 2020-03-04 MED FILL — MITIGARE 0.6 MG CAPSULE: 0.6 | 15 days supply | Qty: 30 | Fill #2

## 2020-03-20 ENCOUNTER — Telehealth: Payer: Self-pay

## 2020-03-20 DIAGNOSIS — I471 Supraventricular tachycardia: Secondary | ICD-10-CM

## 2020-03-20 DIAGNOSIS — I10 Essential (primary) hypertension: Secondary | ICD-10-CM

## 2020-03-20 MED ORDER — METOPROLOL SUCCINATE ER 25 MG PO TB24: 25.0000 mg | ORAL_TABLET | Freq: Every day | ORAL | 3 refills | Status: DC

## 2020-03-20 MED ORDER — AMLODIPINE BESYLATE 5 MG PO TABS: 5.0000 mg | ORAL_TABLET | Freq: Every day | ORAL | 3 refills | Status: DC

## 2020-03-20 MED FILL — AMLODIPINE BESYLATE 5 MG TA: 5 | 90 days supply | Qty: 90 | Fill #0

## 2020-03-20 NOTE — Telephone Encounter (Signed)
Carelink alert received 03/19/20 for 5 new tachycardia events detected. EGM available was on 03/12/20 at 10:57 am, total of 3 minutes 52 seconds, max. V. Rate 171 bpm.   Called patient to assess. States he has been feeling fine and has no complaints.Patient reports states he ran out of amlodipine 5 mg daily and Toprol XL 25 mg daily. Patient encouraged to call pharmacy today and see about getting mediations refilled. Advised if he has any issues to please have the pharmacy call the office to speak with a nurse. It is very important he take his medications.   Patient verbalizes understanding and agrees to plan.

## 2020-03-24 ENCOUNTER — Ambulatory Visit (INDEPENDENT_AMBULATORY_CARE_PROVIDER_SITE_OTHER): Payer: Medicaid Other | Admitting: *Deleted

## 2020-03-24 DIAGNOSIS — I639 Cerebral infarction, unspecified: Secondary | ICD-10-CM

## 2020-03-24 LAB — CUP PACEART REMOTE DEVICE CHECK
Date Time Interrogation Session: 20210827231832
Implantable Pulse Generator Implant Date: 20191004

## 2020-03-26 NOTE — Progress Notes (Signed)
Carelink Summary Report / Loop Recorder 

## 2020-04-07 ENCOUNTER — Telehealth: Payer: Self-pay

## 2020-04-07 NOTE — Telephone Encounter (Signed)
LMOVM for pt to send a manual transmission. I left my direct office number for the pt to return my call.

## 2020-04-07 NOTE — Telephone Encounter (Signed)
-----   Message from Charleen Kirks, RN sent at 04/07/2020  8:14 AM EDT ----- Regarding: needs LINQ manual transmission Please contact patient to request a manual LINQ transmission for review of any untransmitted episodes.  Thank you! Manuel Harrison

## 2020-04-11 ENCOUNTER — Ambulatory Visit: Payer: Medicaid Other | Admitting: Family Medicine

## 2020-04-14 NOTE — Telephone Encounter (Signed)
LVM on patient phone to give call back and left DC direct #

## 2020-04-16 NOTE — Telephone Encounter (Signed)
Sent certified letter 04/16/2020

## 2020-04-26 LAB — CUP PACEART REMOTE DEVICE CHECK
Date Time Interrogation Session: 20210929233259
Implantable Pulse Generator Implant Date: 20191004

## 2020-04-28 ENCOUNTER — Ambulatory Visit: Payer: Medicaid Other | Admitting: Family Medicine

## 2020-04-28 ENCOUNTER — Ambulatory Visit (INDEPENDENT_AMBULATORY_CARE_PROVIDER_SITE_OTHER): Payer: Medicaid Other

## 2020-04-28 DIAGNOSIS — Z8673 Personal history of transient ischemic attack (TIA), and cerebral infarction without residual deficits: Secondary | ICD-10-CM

## 2020-04-29 NOTE — Progress Notes (Signed)
Carelink Summary Report / Loop Recorder 

## 2020-05-12 ENCOUNTER — Telehealth (INDEPENDENT_AMBULATORY_CARE_PROVIDER_SITE_OTHER): Payer: Medicaid Other | Admitting: Family Medicine

## 2020-05-12 ENCOUNTER — Ambulatory Visit: Payer: Medicaid Other | Admitting: Family Medicine

## 2020-05-12 DIAGNOSIS — Z720 Tobacco use: Secondary | ICD-10-CM

## 2020-05-12 DIAGNOSIS — M1A0710 Idiopathic chronic gout, right ankle and foot, without tophus (tophi): Secondary | ICD-10-CM

## 2020-05-12 DIAGNOSIS — I10 Essential (primary) hypertension: Secondary | ICD-10-CM | POA: Diagnosis not present

## 2020-05-12 DIAGNOSIS — M1712 Unilateral primary osteoarthritis, left knee: Secondary | ICD-10-CM | POA: Diagnosis not present

## 2020-05-12 DIAGNOSIS — Z09 Encounter for follow-up examination after completed treatment for conditions other than malignant neoplasm: Secondary | ICD-10-CM

## 2020-05-12 NOTE — Progress Notes (Signed)
Virtual Visit via Telephone Note  I connected with Manuel Harrison on 05/14/20 at  3:20 PM EDT by telephone and verified that I am speaking with the correct person using two identifiers.   I discussed the limitations, risks, security and privacy concerns of performing an evaluation and management service by telephone and the availability of in person appointments. I also discussed with the patient that there may be a patient responsible charge related to this service. The patient expressed understanding and agreed to proceed.  Televisit Today Patient Location: Home Provider Location: Office   History of Present Illness:  Patient Active Problem List   Diagnosis Date Noted  . Left facial pain 04/16/2019  . Chronic gout of right foot 04/16/2019  . Head trauma 02/11/2019  . Acute ischemic right MCA stroke (HCC)   . Acute right arterial ischemic stroke, middle cerebral artery (MCA) (HCC) 04/25/2018  . 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 Acute respiratory failure with hypoxia (HCC)   . Osteoarthritis of left knee 11/24/2015  . Essential hypertension 11/24/2015  . Dry cough 11/24/2015  . Seasonal allergies 11/24/2015  . Dyslipidemia 01/30/2013  . Acute ischemic stroke (HCC) 10/07/2012  . Tobacco abuse disorder 10/06/2012  . History of cocaine use 10/06/2012    Current Status: Since his last office visit, he is doing well with no complaints.  He states that he is out of Brilinta, which he was given samples from Cardiologist, and has ran out of medication since samples ran out. He denies fevers, chills, fatigue, recent infections, weight loss, and night sweats. He has not had any headaches, visual changes, dizziness, and falls. No chest pain, heart palpitations, cough and shortness of breath reported. Denies GI problems such as nausea, vomiting, diarrhea, and constipation. He has no reports of blood in stools, dysuria and hematuria. No depression or anxiety, and denies  suicidal ideations, homicidal ideations, or auditory hallucinations. He is taking all medications as prescribed. He denies pain today.    Observations/Objective:  Telephone Visit   Assessment and Plan:  1. Hypertension, unspecified type We will contact [facility] for possible assistance with Ticagrelor (Brilinta). He will follow up with Cardiologist as needed. he will continue to take medications as prescribed, to decrease high sodium intake, excessive alcohol intake, increase potassium intake, smoking cessation, and increase physical activity of at least 30 minutes of cardio activity daily. He will continue to follow Heart Healthy or DASH diet.  2. Chronic gout of right foot, unspecified cause Continue medications as prescribed.   3. Osteoarthritis of left knee, unspecified osteoarthritis type  4. Tobacco use  5. Follow up He will keep follow up appointment as scheduled.   No orders of the defined types were placed in this encounter.   No orders of the defined types were placed in this encounter.   Referral Orders  No referral(s) requested today    Raliegh Ip,  MSN, FNP-BC [facility] [address] [place], [place] [address] [phone]- fax  I discussed the assessment and treatment plan with the patient. The patient was provided an opportunity to ask questions and all were answered. The patient agreed with the plan and demonstrated an understanding of the instructions.   The patient was advised to call back or seek an in-person evaluation if the symptoms worsen or if the condition fails to improve as anticipated.  I provided 20 minutes of non-face-to-face time during this  encounter.   Azzie Glatter, FNP

## 2020-05-14 ENCOUNTER — Encounter: Payer: Self-pay | Admitting: Family Medicine

## 2020-05-14 ENCOUNTER — Other Ambulatory Visit: Payer: Self-pay | Admitting: Family Medicine

## 2020-05-14 ENCOUNTER — Telehealth: Payer: Self-pay | Admitting: Family Medicine

## 2020-05-14 DIAGNOSIS — Z8673 Personal history of transient ischemic attack (TIA), and cerebral infarction without residual deficits: Secondary | ICD-10-CM

## 2020-05-14 DIAGNOSIS — I10 Essential (primary) hypertension: Secondary | ICD-10-CM

## 2020-05-14 MED ORDER — TICAGRELOR 90 MG PO TABS: 90.0000 mg | ORAL_TABLET | Freq: Two times a day (BID) | ORAL | 3 refills | Status: DC

## 2020-05-14 MED FILL — BRILINTA 90 MG TABLET: 90 | 90 days supply | Qty: 180 | Fill #0

## 2020-05-14 NOTE — Telephone Encounter (Signed)
Consulted with [facility] concerning assistance with Brilinta. Pharmacist will attempt to submit through drug company for approval for monthly supply of this medication. Informed patient to contact pharmacy today. Patient verbalized understanding.

## 2020-05-16 MED FILL — ROSUVASTATIN CALCIUM 20 MG: 20 | 90 days supply | Qty: 90 | Fill #1

## 2020-05-29 LAB — CUP PACEART REMOTE DEVICE CHECK
Date Time Interrogation Session: 20211101233158
Implantable Pulse Generator Implant Date: 20191004

## 2020-06-02 ENCOUNTER — Ambulatory Visit (INDEPENDENT_AMBULATORY_CARE_PROVIDER_SITE_OTHER): Payer: Medicaid Other

## 2020-06-02 DIAGNOSIS — Z8673 Personal history of transient ischemic attack (TIA), and cerebral infarction without residual deficits: Secondary | ICD-10-CM

## 2020-06-03 NOTE — Progress Notes (Signed)
Carelink Summary Report / Loop Recorder 

## 2020-06-29 LAB — CUP PACEART REMOTE DEVICE CHECK
Date Time Interrogation Session: 20211204223512
Implantable Pulse Generator Implant Date: 20191004

## 2020-07-07 ENCOUNTER — Ambulatory Visit (INDEPENDENT_AMBULATORY_CARE_PROVIDER_SITE_OTHER): Payer: Medicaid Other

## 2020-07-07 DIAGNOSIS — Z8673 Personal history of transient ischemic attack (TIA), and cerebral infarction without residual deficits: Secondary | ICD-10-CM | POA: Diagnosis not present

## 2020-07-22 NOTE — Progress Notes (Signed)
Carelink Summary Report / Loop Recorder 

## 2020-08-11 ENCOUNTER — Ambulatory Visit (INDEPENDENT_AMBULATORY_CARE_PROVIDER_SITE_OTHER): Payer: Medicaid Other

## 2020-08-11 ENCOUNTER — Telehealth: Payer: Self-pay | Admitting: *Deleted

## 2020-08-11 DIAGNOSIS — I639 Cerebral infarction, unspecified: Secondary | ICD-10-CM

## 2020-08-11 LAB — CUP PACEART REMOTE DEVICE CHECK
Date Time Interrogation Session: 20220116000902
Implantable Pulse Generator Implant Date: 20191004

## 2020-08-11 NOTE — Telephone Encounter (Signed)
  Patient Consent for Virtual Visit         Manuel Harrison has provided verbal consent on 08/11/2020 for a virtual visit (video or telephone).   CONSENT FOR VIRTUAL VISIT FOR:  Manuel Harrison  By participating in this virtual visit I agree to the following:  I hereby voluntarily request, consent and authorize [facility] and its employed or contracted physicians, physician assistants, nurse practitioners or other licensed health care professionals (the Practitioner), to provide me with telemedicine health care services (the "Services") as deemed necessary by the treating Practitioner. I acknowledge and consent to receive the Services by the Practitioner via telemedicine. I understand that the telemedicine visit will involve communicating with the Practitioner through live audiovisual communication technology and the disclosure of certain medical information by electronic transmission. I acknowledge that I have been given the opportunity to request an in-person assessment or other available alternative prior to the telemedicine visit and am voluntarily participating in the telemedicine visit.  I understand that I have the right to withhold or withdraw my consent to the use of telemedicine in the course of my care at any time, without affecting my right to future care or treatment, and that the Practitioner or I may terminate the telemedicine visit at any time. I understand that I have the right to inspect all information obtained and/or recorded in the course of the telemedicine visit and may receive copies of available information for a reasonable fee.  I understand that some of the potential risks of receiving the Services via telemedicine include:  [name] Delay or interruption in medical evaluation due to technological equipment failure or disruption; . Information transmitted may not be sufficient (e.g. poor resolution of images) to allow for appropriate medical decision making by the  Practitioner; and/or  . In rare instances, security protocols could fail, causing a breach of personal health information.  Furthermore, I acknowledge that it is my responsibility to provide information about my medical history, conditions and care that is complete and accurate to the best of my ability. I acknowledge that Practitioner's advice, recommendations, and/or decision may be based on factors not within their control, such as incomplete or inaccurate data provided by me or distortions of diagnostic images or specimens that may result from electronic transmissions. I understand that the practice of medicine is not an exact science and that Practitioner makes no warranties or guarantees regarding treatment outcomes. I acknowledge that a copy of this consent can be made available to me via my patient portal [facility] MyChart), or I can request a printed copy by calling the office of [facility].    I understand that my insurance will be billed for this visit.   I have read or had this consent read to me. . I understand the contents of this consent, which adequately explains the benefits and risks of the Services being provided via telemedicine.  . I have been provided ample opportunity to ask questions regarding this consent and the Services and have had my questions answered to my satisfaction. . I give my informed consent for the services to be provided through the use of telemedicine in my medical care

## 2020-08-12 ENCOUNTER — Telehealth: Payer: Medicaid Other | Admitting: Internal Medicine

## 2020-08-12 ENCOUNTER — Other Ambulatory Visit: Payer: Self-pay

## 2020-08-13 ENCOUNTER — Telehealth (INDEPENDENT_AMBULATORY_CARE_PROVIDER_SITE_OTHER): Payer: Medicaid Other | Admitting: Family Medicine

## 2020-08-13 ENCOUNTER — Encounter: Payer: Self-pay | Admitting: Family Medicine

## 2020-08-13 ENCOUNTER — Other Ambulatory Visit: Payer: Self-pay | Admitting: Family Medicine

## 2020-08-13 VITALS — Ht 68.0 in | Wt 170.0 lb

## 2020-08-13 DIAGNOSIS — Z09 Encounter for follow-up examination after completed treatment for conditions other than malignant neoplasm: Secondary | ICD-10-CM

## 2020-08-13 DIAGNOSIS — E785 Hyperlipidemia, unspecified: Secondary | ICD-10-CM

## 2020-08-13 DIAGNOSIS — M1A0710 Idiopathic chronic gout, right ankle and foot, without tophus (tophi): Secondary | ICD-10-CM | POA: Diagnosis not present

## 2020-08-13 DIAGNOSIS — M109 Gout, unspecified: Secondary | ICD-10-CM

## 2020-08-13 DIAGNOSIS — M1712 Unilateral primary osteoarthritis, left knee: Secondary | ICD-10-CM

## 2020-08-13 DIAGNOSIS — Z760 Encounter for issue of repeat prescription: Secondary | ICD-10-CM

## 2020-08-13 DIAGNOSIS — Z720 Tobacco use: Secondary | ICD-10-CM

## 2020-08-13 DIAGNOSIS — I10 Essential (primary) hypertension: Secondary | ICD-10-CM | POA: Diagnosis not present

## 2020-08-13 DIAGNOSIS — I471 Supraventricular tachycardia, unspecified: Secondary | ICD-10-CM

## 2020-08-13 MED ORDER — AMLODIPINE BESYLATE 5 MG PO TABS: 5.0000 mg | ORAL_TABLET | Freq: Every day | ORAL | 3 refills | Status: DC

## 2020-08-13 MED ORDER — ROSUVASTATIN CALCIUM 20 MG PO TABS: 20.0000 mg | ORAL_TABLET | Freq: Every day | ORAL | 3 refills | Status: DC

## 2020-08-13 MED ORDER — ALLOPURINOL 100 MG PO TABS: 100.0000 mg | ORAL_TABLET | Freq: Every day | ORAL | 3 refills | Status: DC

## 2020-08-13 MED ORDER — IBUPROFEN 800 MG PO TABS: 800.0000 mg | ORAL_TABLET | Freq: Three times a day (TID) | ORAL | 6 refills | Status: DC | PRN

## 2020-08-13 MED ORDER — METOPROLOL SUCCINATE ER 25 MG PO TB24: 25.0000 mg | ORAL_TABLET | Freq: Every day | ORAL | 3 refills | Status: DC

## 2020-08-13 MED ORDER — COLCHICINE 0.6 MG PO CAPS: ORAL_CAPSULE | ORAL | 3 refills | Status: DC

## 2020-08-13 MED FILL — ALLOPURINOL 100 MG TABLET: 100 | 90 days supply | Qty: 90 | Fill #0

## 2020-08-13 MED FILL — IBUPROFEN 800 MG TABLET: 800 | 10 days supply | Qty: 30 | Fill #0

## 2020-08-13 MED FILL — AMLODIPINE BESYLATE 5 MG TA: 5 | 90 days supply | Qty: 90 | Fill #0

## 2020-08-13 MED FILL — ROSUVASTATIN CALCIUM 20 MG: 20 | 90 days supply | Qty: 90 | Fill #0

## 2020-08-13 MED FILL — MITIGARE 0.6 MG CAPS: 0.6 | 84 days supply | Qty: 90 | Fill #0

## 2020-08-13 MED FILL — METOPROLOL SUCCINATE ER 25: 25 | 90 days supply | Qty: 90 | Fill #0

## 2020-08-13 NOTE — Progress Notes (Signed)
Virtual Visit via Telephone Note  I connected with Manuel Harrison on 08/13/20 at  1:20 PM EST by telephone and verified that I am speaking with the correct person using two identifiers.  Location: Patient: Home Provider: Home   I discussed the limitations, risks, security and privacy concerns of performing an evaluation and management service by telephone and the availability of in person appointments. I also discussed with the patient that there may be a patient responsible charge related to this service. The patient expressed understanding and agreed to proceed.   History of Present Illness:  Patient Active Problem List   Diagnosis Date Noted  . Left facial pain 04/16/2019  . Chronic gout of right foot 04/16/2019  . Head trauma 02/11/2019  . Acute ischemic right MCA stroke (HCC)   . Acute right arterial ischemic stroke, middle cerebral artery (MCA) (HCC) 04/25/2018  . 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 Acute respiratory failure with hypoxia (HCC)   . Osteoarthritis of left knee 11/24/2015  . Essential hypertension 11/24/2015  . Dry cough 11/24/2015  . Seasonal allergies 11/24/2015  . Dyslipidemia 01/30/2013  . Acute ischemic stroke (HCC) 10/07/2012  . Tobacco abuse disorder 10/06/2012  . History of cocaine use 10/06/2012   Current Status: Since his last office visit, he is doing well with no complaints.  He denies visual changes, chest pain, cough, shortness of breath, heart palpitations, and falls. He has occasional headaches and dizziness with position changes. Denies severe headaches, confusion, seizures, double vision, and blurred vision, nausea and vomiting. He has not had any incidents of gouty attacks lately. He continues to smoke 6-7 cigarettes daily. He denies fevers, chills, fatigue, recent infections, weight loss, and night sweats. Denies GI problems such as diarrhea, and constipation. He has no reports of blood in stools, dysuria and hematuria. No  depression or anxiety reported today. He is taking all medications as prescribed. He denies pain today.     Observations/Objective:  Telephone Virtual Visit   Assessment and Plan:  1. Hypertension, unspecified type He will continue to take medications as prescribed, to decrease high sodium intake, excessive alcohol intake, increase potassium intake, smoking cessation, and increase physical activity of at least 30 minutes of cardio activity daily. He will continue to follow Heart Healthy or DASH diet. - amLODipine (NORVASC) 5 MG tablet; Take 1 tablet (5 mg total) by mouth daily.  Dispense: 90 tablet; Refill: 3  2. Chronic gout of right foot, unspecified cause - allopurinol (ZYLOPRIM) 100 MG tablet; Take 1 tablet (100 mg total) by mouth daily.  Dispense: 90 tablet; Refill: 3  3. Tobacco use Discussed risks and benefits quitting smoking. Declines smoking cessation at this time.   4. Osteoarthritis of left knee, unspecified osteoarthritis type - ibuprofen (ADVIL) 800 MG tablet; Take 1 tablet (800 mg total) by mouth every 8 (eight) hours as needed.  Dispense: 30 tablet; Refill: 6  5. Acute gout, unspecified cause, unspecified site Stable. Monitor.  - allopurinol (ZYLOPRIM) 100 MG tablet; Take 1 tablet (100 mg total) by mouth daily.  Dispense: 90 tablet; Refill: 3 - Colchicine (MITIGARE) 0.6 MG CAPS; Take 1 tablet 3 times a day on the fisrt day of flare up (Maximum daily dose). Thereafter, take 1 tablet 2 times a day until gouty flare up resolves.  Dispense: 90 capsule; Refill: 3  6. SVT (supraventricular tachycardia) (HCC) Stable today. No signs or symptoms of recurrence noted or reported today.  - metoprolol succinate (TOPROL-XL) 25 MG 24 hr tablet;  Take 1 tablet (25 mg total) by mouth daily. Take with or immediately following a meal.  Dispense: 90 tablet; Refill: 3  7. Dyslipidemia - rosuvastatin (CRESTOR) 20 MG tablet; Take 1 tablet (20 mg total) by mouth daily at 6 PM.  Dispense: 90  tablet; Refill: 3  8. Medication refill - allopurinol (ZYLOPRIM) 100 MG tablet; Take 1 tablet (100 mg total) by mouth daily.  Dispense: 90 tablet; Refill: 3 - amLODipine (NORVASC) 5 MG tablet; Take 1 tablet (5 mg total) by mouth daily.  Dispense: 90 tablet; Refill: 3 - Colchicine (MITIGARE) 0.6 MG CAPS; Take 1 tablet 3 times a day on the fisrt day of flare up (Maximum daily dose). Thereafter, take 1 tablet 2 times a day until gouty flare up resolves.  Dispense: 90 capsule; Refill: 3 - ibuprofen (ADVIL) 800 MG tablet; Take 1 tablet (800 mg total) by mouth every 8 (eight) hours as needed.  Dispense: 30 tablet; Refill: 6 - metoprolol succinate (TOPROL-XL) 25 MG 24 hr tablet; Take 1 tablet (25 mg total) by mouth daily. Take with or immediately following a meal.  Dispense: 90 tablet; Refill: 3 - rosuvastatin (CRESTOR) 20 MG tablet; Take 1 tablet (20 mg total) by mouth daily at 6 PM.  Dispense: 90 tablet; Refill: 3  9. Follow up He will follow up in 09/2020 for Annual Physical and Labwork.  Meds ordered this encounter  Medications  . allopurinol (ZYLOPRIM) 100 MG tablet    Sig: Take 1 tablet (100 mg total) by mouth daily.    Dispense:  90 tablet    Refill:  3  . amLODipine (NORVASC) 5 MG tablet    Sig: Take 1 tablet (5 mg total) by mouth daily.    Dispense:  90 tablet    Refill:  3  . Colchicine (MITIGARE) 0.6 MG CAPS    Sig: Take 1 tablet 3 times a day on the fisrt day of flare up (Maximum daily dose). Thereafter, take 1 tablet 2 times a day until gouty flare up resolves.    Dispense:  90 capsule    Refill:  3  . ibuprofen (ADVIL) 800 MG tablet    Sig: Take 1 tablet (800 mg total) by mouth every 8 (eight) hours as needed.    Dispense:  30 tablet    Refill:  6  . metoprolol succinate (TOPROL-XL) 25 MG 24 hr tablet    Sig: Take 1 tablet (25 mg total) by mouth daily. Take with or immediately following a meal.    Dispense:  90 tablet    Refill:  3  . rosuvastatin (CRESTOR) 20 MG tablet     Sig: Take 1 tablet (20 mg total) by mouth daily at 6 PM.    Dispense:  90 tablet    Refill:  3    No orders of the defined types were placed in this encounter.   Referral Orders  No referral(s) requested today    Raliegh Ip, MSN, ANE, FNP-BC [facility]/Internal Medicine/Sickle Cell Center [facility] [address] [place], [place] [address] [phone] fax  I discussed the assessment and treatment plan with the patient. The patient was provided an opportunity to ask questions and all were answered. The patient agreed with the plan and demonstrated an understanding of the instructions.   The patient was advised to call back or seek an in-person evaluation if the symptoms worsen or if the condition fails to improve as anticipated.  I provided  20 minutes of non-face-to-face time during this encounter.   Azzie Glatter, FNP

## 2020-08-19 ENCOUNTER — Encounter: Payer: Self-pay | Admitting: Family Medicine

## 2020-08-25 NOTE — Progress Notes (Signed)
Carelink Summary Report / Loop Recorder 

## 2020-09-12 LAB — CUP PACEART REMOTE DEVICE CHECK
Date Time Interrogation Session: 20220218002811
Implantable Pulse Generator Implant Date: 20191004

## 2020-09-15 ENCOUNTER — Ambulatory Visit (INDEPENDENT_AMBULATORY_CARE_PROVIDER_SITE_OTHER): Payer: Medicare Other

## 2020-09-15 DIAGNOSIS — I639 Cerebral infarction, unspecified: Secondary | ICD-10-CM

## 2020-09-22 NOTE — Progress Notes (Signed)
Carelink Summary Report / Loop Recorder 

## 2020-09-24 ENCOUNTER — Encounter: Payer: Self-pay | Admitting: Family Medicine

## 2020-09-24 ENCOUNTER — Other Ambulatory Visit: Payer: Self-pay

## 2020-09-24 ENCOUNTER — Ambulatory Visit (INDEPENDENT_AMBULATORY_CARE_PROVIDER_SITE_OTHER): Payer: Medicare Other | Admitting: Family Medicine

## 2020-09-24 VITALS — BP 131/85 | HR 94 | Temp 98.1°F | Ht 68.0 in | Wt 162.0 lb

## 2020-09-24 DIAGNOSIS — M1712 Unilateral primary osteoarthritis, left knee: Secondary | ICD-10-CM

## 2020-09-24 DIAGNOSIS — Z09 Encounter for follow-up examination after completed treatment for conditions other than malignant neoplasm: Secondary | ICD-10-CM

## 2020-09-24 DIAGNOSIS — I471 Supraventricular tachycardia, unspecified: Secondary | ICD-10-CM

## 2020-09-24 DIAGNOSIS — E559 Vitamin D deficiency, unspecified: Secondary | ICD-10-CM

## 2020-09-24 DIAGNOSIS — Z0000 Encounter for general adult medical examination without abnormal findings: Secondary | ICD-10-CM

## 2020-09-24 DIAGNOSIS — M109 Gout, unspecified: Secondary | ICD-10-CM

## 2020-09-24 DIAGNOSIS — I10 Essential (primary) hypertension: Secondary | ICD-10-CM

## 2020-09-24 DIAGNOSIS — E538 Deficiency of other specified B group vitamins: Secondary | ICD-10-CM

## 2020-09-24 DIAGNOSIS — Z720 Tobacco use: Secondary | ICD-10-CM

## 2020-09-24 DIAGNOSIS — M1A0710 Idiopathic chronic gout, right ankle and foot, without tophus (tophi): Secondary | ICD-10-CM | POA: Diagnosis not present

## 2020-09-24 NOTE — Progress Notes (Signed)
. Patient Care Center Internal Medicine and Sickle Cell Care   Established Patient Office Visit  Subjective:  Patient ID: Manuel Harrison, male    DOB: 19-Jan-1971  Age: 50 y.o. MRN: [id number]  CC:  Chief Complaint  Patient presents with  . Annual Exam    Physical and labs     HPI Manuel Harrison is a 50 year old male who presents for Follow Up today.    Patient Active Problem List   Diagnosis Date Noted  . Left facial pain 04/16/2019  . Chronic gout of right foot 04/16/2019  . Head trauma 02/11/2019  . Acute ischemic right MCA stroke (HCC)   . Acute right arterial ischemic stroke, middle cerebral artery (MCA) (HCC) 04/25/2018  . 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 Acute respiratory failure with hypoxia (HCC)   . Osteoarthritis of left knee 11/24/2015  . Essential hypertension 11/24/2015  . Dry cough 11/24/2015  . Seasonal allergies 11/24/2015  . Dyslipidemia 01/30/2013  . Acute ischemic stroke (HCC) 10/07/2012  . Tobacco abuse disorder 10/06/2012  . History of cocaine use 10/06/2012   Current Status: Since his last office visit, he is doing well with no complaints. He denies fevers, chills, fatigue, recent infections, weight loss, and night sweats. He has not had any headaches, visual changes, dizziness, and falls. No chest pain, heart palpitations, cough and shortness of breath reported. Denies GI problems such as nausea, vomiting, diarrhea, and constipation. He has no reports of blood in stools, dysuria and hematuria. No depression or anxiety, and denies suicidal ideations, homicidal ideations, or auditory hallucinations. He is taking all medications as prescribed. He denies pain today.   Past Medical History:  Diagnosis Date  . 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 Arthritis    "left knee" (04/26/2018)  . Facial fracture (HCC) 01/2019  . Gout 03/2019  . High cholesterol   . History of gout    . Hypertension   . Stroke [facility]) 09/2012   "right side is weaker since" (10//08/2017)  . Tobacco use 03/2019  . Vitamin D deficiency 09/2019    Past Surgical History:  Procedure Laterality Date  . IR CT HEAD LTD  04/25/2018  . IR INTRA CRAN STENT  04/25/2018  . IR PERCUTANEOUS ART THROMBECTOMY/INFUSION INTRACRANIAL INC DIAG ANGIO  04/25/2018  . LOOP RECORDER INSERTION N/A 04/28/2018   Procedure: LOOP RECORDER INSERTION;  Surgeon: Marinus Maw, MD;  Location: [facility] INVASIVE CV LAB;  Service: Cardiovascular;  Laterality: N/A;  . RADIOLOGY WITH ANESTHESIA N/A 04/25/2018   Procedure: RADIOLOGY WITH ANESTHESIA;  Surgeon: Julieanne Cotton, MD;  Location: [facility] OR;  Service: Radiology;  Laterality: N/A;  . TEE WITHOUT CARDIOVERSION N/A 04/28/2018   Procedure: TRANSESOPHAGEAL ECHOCARDIOGRAM (TEE);  Surgeon: Jodelle Red, MD;  Location: [facility] ENDOSCOPY;  Service: Cardiovascular;  Laterality: N/A;    Family History  Problem Relation Age of Onset  . Other Mother        no health concerns  . Other Father        no health concerns    Social History   Socioeconomic History  . Marital status: Single    Spouse name: Not on file  . Number of children: Not on file  . Years of education: Not on file  . Highest education level: Not on file  Occupational History  . Not on file  Tobacco Use  . Smoking status: Current Every Day Smoker  Packs/day: 2.00    Years: 32.00    Pack years: 64.00    Types: Cigarettes  . Smokeless tobacco: Never Used  Vaping Use  . Vaping Use: Never used  Substance and Sexual Activity  . Alcohol use: Yes    Alcohol/week: 47.0 standard drinks    Types: 47 Cans of beer per week    Comment: 04/26/2018 "2- 40 ounce cans/day"  . Drug use: Yes    Types: Marijuana    Comment: 04/26/2018 "nothing regular; might smoke weekly, if that"  . Sexual activity: Yes  Other Topics Concern  . Not on file  Social History Narrative  . Not on file   Social Determinants of  Health   Financial Resource Strain: Not on file  Food Insecurity: Not on file  Transportation Needs: Not on file  Physical Activity: Not on file  Stress: Not on file  Social Connections: Not on file  Intimate Partner Violence: Not on file    Outpatient Medications Prior to Visit  Medication Sig Dispense Refill  . allopurinol (ZYLOPRIM) 100 MG tablet Take 1 tablet (100 mg total) by mouth daily. 90 tablet 3  . amLODipine (NORVASC) 5 MG tablet Take 1 tablet (5 mg total) by mouth daily. 90 tablet 3  . Colchicine (MITIGARE) 0.6 MG CAPS Take 1 tablet 3 times a day on the fisrt day of flare up (Maximum daily dose). Thereafter, take 1 tablet 2 times a day until gouty flare up resolves. 90 capsule 3  . ibuprofen (ADVIL) 800 MG tablet Take 1 tablet (800 mg total) by mouth every 8 (eight) hours as needed. 30 tablet 6  . metoprolol succinate (TOPROL-XL) 25 MG 24 hr tablet Take 1 tablet (25 mg total) by mouth daily. Take with or immediately following a meal. 90 tablet 3  . rosuvastatin (CRESTOR) 20 MG tablet Take 1 tablet (20 mg total) by mouth daily at 6 PM. 90 tablet 3  . ticagrelor (BRILINTA) 90 MG TABS tablet Take 1 tablet (90 mg total) by mouth 2 (two) times daily. 180 tablet 3  . Vitamin D, Ergocalciferol, (DRISDOL) 1.25 MG (50000 UNIT) CAPS capsule Take 1 capsule (50,000 Units total) by mouth every 7 (seven) days. 5 capsule 6   No facility-administered medications prior to visit.    Allergies  Allergen Reactions  . Aleve [Naproxen Sodium] Nausea And Vomiting    ROS Review of Systems  Constitutional: Negative.   HENT: Negative.   Eyes: Negative.   Respiratory: Negative.   Cardiovascular: Negative.   Gastrointestinal: Negative.   Endocrine: Negative.   Genitourinary: Negative.   Musculoskeletal: Positive for arthralgias (generalized joint pain).  Skin: Negative.   Allergic/Immunologic: Negative.   Neurological: Positive for dizziness (occasional ) and headaches (occasional ).   Hematological: Negative.   Psychiatric/Behavioral: Negative.       Objective:    Physical Exam Vitals and nursing note reviewed.  Constitutional:      Appearance: Normal appearance.  HENT:     Head: Normocephalic and atraumatic.     Nose: Nose normal.     Mouth/Throat:     Mouth: Mucous membranes are moist.     Pharynx: Oropharynx is clear.  Cardiovascular:     Rate and Rhythm: Normal rate and regular rhythm.     Pulses: Normal pulses.     Heart sounds: Normal heart sounds.  Pulmonary:     Effort: Pulmonary effort is normal.     Breath sounds: Normal breath sounds.  Abdominal:  General: Bowel sounds are normal.     Palpations: Abdomen is soft.  Musculoskeletal:        General: Normal range of motion.     Cervical back: Normal range of motion and neck supple.  Skin:    General: Skin is warm and dry.  Neurological:     General: No focal deficit present.     Mental Status: He is alert and oriented to person, place, and time.  Psychiatric:        Mood and Affect: Mood normal.        Behavior: Behavior normal.        Thought Content: Thought content normal.        Judgment: Judgment normal.     BP 131/85 (BP Location: Left Arm, Patient Position: Sitting, Cuff Size: Normal)   Pulse 94   Temp 98.1 F (36.7 C) (Temporal)   Ht 5\' 8"  (1.727 m)   Wt 162 lb (73.5 kg)   SpO2 98%   BMI 24.63 kg/m  Wt Readings from Last 3 Encounters:  09/24/20 162 lb (73.5 kg)  08/13/20 170 lb (77.1 kg)  11/28/19 163 lb (73.9 kg)     Health Maintenance Due  Topic Date Due  . Hepatitis C Screening  Never done  . COVID-19 Vaccine (1) Never done  . COLONOSCOPY (Pts 45-2668yrs Insurance coverage will need to be confirmed)  Never done  . INFLUENZA VACCINE  02/24/2020    There are no preventive care reminders to display for this patient.  Lab Results  Component Value Date   TSH 2.070 09/24/2020   Lab Results  Component Value Date   WBC 6.4 09/24/2020   HGB 15.9 09/24/2020    HCT 44.4 09/24/2020   MCV 91 09/24/2020   PLT 287 09/24/2020   Lab Results  Component Value Date   NA 137 09/24/2020   K 4.6 09/24/2020   CO2 22 09/24/2020   GLUCOSE 85 09/24/2020   BUN 11 09/24/2020   CREATININE 1.15 09/24/2020   BILITOT 0.3 09/24/2020   ALKPHOS 69 09/24/2020   AST 37 09/24/2020   ALT 26 09/24/2020   PROT 7.4 09/24/2020   ALBUMIN 4.9 09/24/2020   CALCIUM 10.2 09/24/2020   ANIONGAP 9 02/11/2019   Lab Results  Component Value Date   CHOL 155 09/24/2020   Lab Results  Component Value Date   HDL 51 09/24/2020   Lab Results  Component Value Date   LDLCALC 84 09/24/2020   Lab Results  Component Value Date   TRIG 111 09/24/2020   Lab Results  Component Value Date   CHOLHDL 3.0 09/24/2020   Lab Results  Component Value Date   HGBA1C 5.5 10/10/2019      Assessment & Plan:   1. Hypertension, unspecified type The current medical regimen is effective; blood pressure is stable at 131/85 today; continue present plan and medications as prescribed. He will continue to take medications as prescribed, to decrease high sodium intake, excessive alcohol intake, increase potassium intake, smoking cessation, and increase physical activity of at least 30 minutes of cardio activity daily. He will continue to follow Heart Healthy or DASH diet. - CBC with Differential  2. SVT (supraventricular tachycardia) (HCC)  3. Chronic gout of right foot, unspecified cause Stable.   4. Osteoarthritis of left knee, unspecified osteoarthritis type  5. Acute gout, unspecified cause, unspecified site  6. Tobacco use  7. Healthcare maintenance - Comprehensive metabolic panel - Lipid Panel - TSH - PSA  10.  Follow up He will follow up 6 months..   No orders of the defined types were placed in this encounter.   Orders Placed This Encounter  Procedures  . CBC with Differential  . Comprehensive metabolic panel  . Lipid Panel  . TSH  . Vitamin B12  . PSA  . Vitamin  D, 25-hydroxy    Referral Orders  No referral(s) requested today    Raliegh Ip, MSN, ANE, FNP-BC [facility] Patient Care Center/Internal Medicine/Sickle Cell Center [facility] [address] [place], [place] [address] [phone] fax  Problem List Items Addressed This Visit      Musculoskeletal and Integument   Chronic gout of right foot   Osteoarthritis of left knee    Other Visit Diagnoses    Hypertension, unspecified type    -  Primary   Relevant Orders   CBC with Differential (Completed)   SVT (supraventricular tachycardia) (HCC)       Acute gout, unspecified cause, unspecified site       Tobacco use       Vitamin B12 deficiency       Relevant Orders   Vitamin B12 (Completed)   Vitamin D deficiency       Relevant Orders   Vitamin D, 25-hydroxy (Completed)   Healthcare maintenance       Relevant Orders   Comprehensive metabolic panel (Completed)   Lipid Panel (Completed)   TSH (Completed)   PSA (Completed)   Follow up          No orders of the defined types were placed in this encounter.   Follow-up: No follow-ups on file.    Kallie Locks, FNP

## 2020-09-25 LAB — COMPREHENSIVE METABOLIC PANEL
ALT: 26 IU/L (ref 0–44)
AST: 37 IU/L (ref 0–40)
Albumin/Globulin Ratio: 2 (ref 1.2–2.2)
Albumin: 4.9 g/dL (ref 3.8–4.9)
Alkaline Phosphatase: 69 IU/L (ref 44–121)
BUN/Creatinine Ratio: 10 (ref 9–20)
BUN: 11 mg/dL (ref 6–24)
Bilirubin Total: 0.3 mg/dL (ref 0.0–1.2)
CO2: 22 mmol/L (ref 20–29)
Calcium: 10.2 mg/dL (ref 8.7–10.2)
Chloride: 99 mmol/L (ref 96–106)
Creatinine, Ser: 1.15 mg/dL (ref 0.76–1.27)
Globulin, Total: 2.5 g/dL (ref 1.5–4.5)
Glucose: 85 mg/dL (ref 65–99)
Potassium: 4.6 mmol/L (ref 3.5–5.2)
Sodium: 137 mmol/L (ref 134–144)
Total Protein: 7.4 g/dL (ref 6.0–8.5)
eGFR: 77 mL/min/{1.73_m2} (ref >59)

## 2020-09-25 LAB — CBC WITH DIFFERENTIAL/PLATELET
Basophils Absolute: 0 10*3/uL (ref 0.0–0.2)
Basos: 1 %
EOS (ABSOLUTE): 0.1 10*3/uL (ref 0.0–0.4)
Eos: 1 %
Hematocrit: 44.4 % (ref 37.5–51.0)
Hemoglobin: 15.9 g/dL (ref 13.0–17.7)
Immature Grans (Abs): 0 10*3/uL (ref 0.0–0.1)
Immature Granulocytes: 0 %
Lymphocytes Absolute: 1.7 10*3/uL (ref 0.7–3.1)
Lymphs: 27 %
MCH: 32.7 pg (ref 26.6–33.0)
MCHC: 35.8 g/dL — ABNORMAL HIGH (ref 31.5–35.7)
MCV: 91 fL (ref 79–97)
Monocytes Absolute: 0.5 10*3/uL (ref 0.1–0.9)
Monocytes: 7 %
Neutrophils Absolute: 4.1 10*3/uL (ref 1.4–7.0)
Neutrophils: 64 %
Platelets: 287 10*3/uL (ref 150–450)
RBC: 4.86 x10E6/uL (ref 4.14–5.80)
RDW: 14 % (ref 11.6–15.4)
WBC: 6.4 10*3/uL (ref 3.4–10.8)

## 2020-09-25 LAB — TSH: TSH: 2.07 u[IU]/mL (ref 0.450–4.500)

## 2020-09-25 LAB — LIPID PANEL
Chol/HDL Ratio: 3 ratio (ref 0.0–5.0)
Cholesterol, Total: 155 mg/dL (ref 100–199)
HDL: 51 mg/dL (ref >39)
LDL Chol Calc (NIH): 84 mg/dL (ref 0–99)
Triglycerides: 111 mg/dL (ref 0–149)
VLDL Cholesterol Cal: 20 mg/dL (ref 5–40)

## 2020-09-25 LAB — VITAMIN B12: Vitamin B-12: 398 pg/mL (ref 232–1245)

## 2020-09-25 LAB — VITAMIN D 25 HYDROXY (VIT D DEFICIENCY, FRACTURES): Vit D, 25-Hydroxy: 13 ng/mL — ABNORMAL LOW (ref 30.0–100.0)

## 2020-09-25 LAB — PSA: Prostate Specific Ag, Serum: 1.6 ng/mL (ref 0.0–4.0)

## 2020-09-26 ENCOUNTER — Other Ambulatory Visit: Payer: Self-pay | Admitting: Family Medicine

## 2020-09-26 ENCOUNTER — Encounter: Payer: Self-pay | Admitting: Family Medicine

## 2020-09-26 DIAGNOSIS — E559 Vitamin D deficiency, unspecified: Secondary | ICD-10-CM

## 2020-09-26 MED ORDER — VITAMIN D (ERGOCALCIFEROL) 1.25 MG (50000 UNIT) PO CAPS: 50000.0000 [IU] | ORAL_CAPSULE | ORAL | 3 refills | Status: DC

## 2020-09-29 ENCOUNTER — Encounter: Payer: Self-pay | Admitting: Family Medicine

## 2020-10-18 LAB — CUP PACEART REMOTE DEVICE CHECK
Date Time Interrogation Session: 20220323012803
Implantable Pulse Generator Implant Date: 20191004

## 2020-10-20 ENCOUNTER — Ambulatory Visit (INDEPENDENT_AMBULATORY_CARE_PROVIDER_SITE_OTHER): Payer: Medicare Other

## 2020-10-20 DIAGNOSIS — I63511 Cerebral infarction due to unspecified occlusion or stenosis of right middle cerebral artery: Secondary | ICD-10-CM

## 2020-10-31 NOTE — Progress Notes (Signed)
Carelink Summary Report / Loop Recorder 

## 2020-11-17 ENCOUNTER — Ambulatory Visit (INDEPENDENT_AMBULATORY_CARE_PROVIDER_SITE_OTHER): Payer: Medicare Other

## 2020-11-17 DIAGNOSIS — I63511 Cerebral infarction due to unspecified occlusion or stenosis of right middle cerebral artery: Secondary | ICD-10-CM | POA: Diagnosis not present

## 2020-11-18 LAB — CUP PACEART REMOTE DEVICE CHECK
Date Time Interrogation Session: 20220425013114
Implantable Pulse Generator Implant Date: 20191004

## 2020-12-05 NOTE — Progress Notes (Signed)
Carelink Summary Report / Loop Recorder 

## 2020-12-22 LAB — CUP PACEART REMOTE DEVICE CHECK
Date Time Interrogation Session: 20220528013459
Implantable Pulse Generator Implant Date: 20191004

## 2020-12-23 ENCOUNTER — Ambulatory Visit (INDEPENDENT_AMBULATORY_CARE_PROVIDER_SITE_OTHER): Payer: Medicare Other

## 2020-12-23 DIAGNOSIS — Z95818 Presence of other cardiac implants and grafts: Secondary | ICD-10-CM

## 2020-12-23 DIAGNOSIS — I63511 Cerebral infarction due to unspecified occlusion or stenosis of right middle cerebral artery: Secondary | ICD-10-CM

## 2021-01-14 NOTE — Progress Notes (Signed)
Carelink Summary Report / Loop Recorder 

## 2021-01-23 ENCOUNTER — Ambulatory Visit (INDEPENDENT_AMBULATORY_CARE_PROVIDER_SITE_OTHER): Payer: Medicare Other

## 2021-01-23 DIAGNOSIS — I63511 Cerebral infarction due to unspecified occlusion or stenosis of right middle cerebral artery: Secondary | ICD-10-CM | POA: Diagnosis not present

## 2021-01-26 LAB — CUP PACEART REMOTE DEVICE CHECK
Date Time Interrogation Session: 20220630013924
Implantable Pulse Generator Implant Date: 20191004

## 2021-02-12 NOTE — Progress Notes (Signed)
Carelink Summary Report / Loop Recorder 

## 2021-02-24 ENCOUNTER — Other Ambulatory Visit: Payer: Self-pay

## 2021-02-24 DIAGNOSIS — M109 Gout, unspecified: Secondary | ICD-10-CM

## 2021-02-24 DIAGNOSIS — I471 Supraventricular tachycardia: Secondary | ICD-10-CM

## 2021-02-24 DIAGNOSIS — Z8673 Personal history of transient ischemic attack (TIA), and cerebral infarction without residual deficits: Secondary | ICD-10-CM

## 2021-02-24 DIAGNOSIS — I10 Essential (primary) hypertension: Secondary | ICD-10-CM

## 2021-02-24 DIAGNOSIS — E559 Vitamin D deficiency, unspecified: Secondary | ICD-10-CM

## 2021-02-24 DIAGNOSIS — M1A0710 Idiopathic chronic gout, right ankle and foot, without tophus (tophi): Secondary | ICD-10-CM

## 2021-02-24 DIAGNOSIS — M1712 Unilateral primary osteoarthritis, left knee: Secondary | ICD-10-CM

## 2021-02-24 DIAGNOSIS — Z760 Encounter for issue of repeat prescription: Secondary | ICD-10-CM

## 2021-02-24 DIAGNOSIS — E785 Hyperlipidemia, unspecified: Secondary | ICD-10-CM

## 2021-02-24 LAB — CUP PACEART REMOTE DEVICE CHECK
Date Time Interrogation Session: 20220802015213
Implantable Pulse Generator Implant Date: 20191004

## 2021-02-24 MED ORDER — METOPROLOL SUCCINATE ER 25 MG PO TB24: ORAL_TABLET | ORAL | 3 refills | Status: AC

## 2021-02-24 MED ORDER — AMLODIPINE BESYLATE 5 MG PO TABS: ORAL_TABLET | Freq: Every day | ORAL | 3 refills | Status: AC

## 2021-02-24 MED ORDER — VITAMIN D (ERGOCALCIFEROL) 1.25 MG (50000 UNIT) PO CAPS
50000.0000 [IU] | ORAL_CAPSULE | ORAL | 3 refills | Status: AC
Start: 2021-02-24 — End: ?

## 2021-02-24 MED ORDER — TICAGRELOR 90 MG PO TABS
ORAL_TABLET | Freq: Two times a day (BID) | ORAL | 3 refills | Status: AC
Start: 2021-02-24 — End: 2022-02-24

## 2021-02-24 MED ORDER — ALLOPURINOL 100 MG PO TABS: ORAL_TABLET | Freq: Every day | ORAL | 3 refills | Status: AC

## 2021-02-24 MED ORDER — ROSUVASTATIN CALCIUM 20 MG PO TABS: ORAL_TABLET | ORAL | 3 refills | Status: AC

## 2021-02-24 MED ORDER — COLCHICINE 0.6 MG PO CAPS: ORAL_CAPSULE | ORAL | 3 refills | Status: AC

## 2021-02-24 MED ORDER — IBUPROFEN 800 MG PO TABS: ORAL_TABLET | Freq: Three times a day (TID) | ORAL | 6 refills | Status: AC | PRN

## 2021-02-25 ENCOUNTER — Ambulatory Visit (INDEPENDENT_AMBULATORY_CARE_PROVIDER_SITE_OTHER): Payer: Medicare Other

## 2021-02-25 DIAGNOSIS — I639 Cerebral infarction, unspecified: Secondary | ICD-10-CM | POA: Diagnosis not present

## 2021-03-24 NOTE — Progress Notes (Signed)
Carelink Summary Report / Loop Recorder 

## 2021-03-27 ENCOUNTER — Ambulatory Visit: Payer: Medicare Other | Admitting: Nurse Practitioner

## 2021-03-27 ENCOUNTER — Ambulatory Visit: Payer: Medicare Other | Admitting: Family Medicine

## 2021-04-15 ENCOUNTER — Other Ambulatory Visit: Payer: Self-pay | Admitting: Nurse Practitioner

## 2021-04-15 ENCOUNTER — Other Ambulatory Visit: Payer: Self-pay

## 2021-04-15 DIAGNOSIS — Z760 Encounter for issue of repeat prescription: Secondary | ICD-10-CM

## 2021-04-15 DIAGNOSIS — E785 Hyperlipidemia, unspecified: Secondary | ICD-10-CM

## 2021-04-17 ENCOUNTER — Telehealth: Payer: Self-pay

## 2021-04-17 NOTE — Telephone Encounter (Signed)
Patient is due for an AWV. Attempted to contact patient but was unsuccessful. 2nd attempt

## 2021-05-04 ENCOUNTER — Ambulatory Visit (INDEPENDENT_AMBULATORY_CARE_PROVIDER_SITE_OTHER): Payer: Medicare Other

## 2021-05-04 DIAGNOSIS — I639 Cerebral infarction, unspecified: Secondary | ICD-10-CM | POA: Diagnosis not present

## 2021-05-06 LAB — CUP PACEART REMOTE DEVICE CHECK
Date Time Interrogation Session: 20221007022744
Implantable Pulse Generator Implant Date: 20191004

## 2021-05-13 NOTE — Progress Notes (Signed)
Carelink Summary Report / Loop Recorder 

## 2021-06-04 LAB — CUP PACEART REMOTE DEVICE CHECK
Date Time Interrogation Session: 20221109012643
Implantable Pulse Generator Implant Date: 20191004

## 2021-06-08 ENCOUNTER — Ambulatory Visit (INDEPENDENT_AMBULATORY_CARE_PROVIDER_SITE_OTHER): Payer: Medicare Other

## 2021-06-08 DIAGNOSIS — I639 Cerebral infarction, unspecified: Secondary | ICD-10-CM | POA: Diagnosis not present

## 2021-06-16 NOTE — Progress Notes (Signed)
Carelink Summary Report / Loop Recorder 

## 2021-08-31 ENCOUNTER — Other Ambulatory Visit: Payer: Self-pay | Admitting: Nurse Practitioner

## 2021-08-31 DIAGNOSIS — E559 Vitamin D deficiency, unspecified: Secondary | ICD-10-CM

## 2021-11-18 ENCOUNTER — Other Ambulatory Visit: Payer: Self-pay | Admitting: Nurse Practitioner

## 2021-11-18 DIAGNOSIS — Z8673 Personal history of transient ischemic attack (TIA), and cerebral infarction without residual deficits: Secondary | ICD-10-CM

## 2021-11-18 DIAGNOSIS — M109 Gout, unspecified: Secondary | ICD-10-CM

## 2021-11-18 DIAGNOSIS — I10 Essential (primary) hypertension: Secondary | ICD-10-CM

## 2021-11-18 DIAGNOSIS — M1712 Unilateral primary osteoarthritis, left knee: Secondary | ICD-10-CM

## 2021-11-18 DIAGNOSIS — Z760 Encounter for issue of repeat prescription: Secondary | ICD-10-CM

## 2021-11-18 DIAGNOSIS — I471 Supraventricular tachycardia: Secondary | ICD-10-CM

## 2022-02-18 ENCOUNTER — Other Ambulatory Visit: Payer: Self-pay | Admitting: Nurse Practitioner

## 2022-02-18 DIAGNOSIS — M109 Gout, unspecified: Secondary | ICD-10-CM

## 2022-02-18 DIAGNOSIS — Z8673 Personal history of transient ischemic attack (TIA), and cerebral infarction without residual deficits: Secondary | ICD-10-CM

## 2022-02-18 DIAGNOSIS — I10 Essential (primary) hypertension: Secondary | ICD-10-CM

## 2022-02-18 DIAGNOSIS — Z760 Encounter for issue of repeat prescription: Secondary | ICD-10-CM

## 2022-02-18 DIAGNOSIS — M1712 Unilateral primary osteoarthritis, left knee: Secondary | ICD-10-CM

## 2022-02-18 DIAGNOSIS — I471 Supraventricular tachycardia: Secondary | ICD-10-CM

## 2022-07-22 ENCOUNTER — Telehealth: Payer: Self-pay

## 2022-07-22 NOTE — Telephone Encounter (Signed)
Spoke with the patient and his mother while he was here.  The address we have listed is incorrect therefore he has not received any letters we have been sending regarding missed transmissions.

## 2022-07-22 NOTE — Telephone Encounter (Signed)
The patient came into the office because he did not have the handheld to his monitor anymore.   He also states that he was feeling numbness in his face and had numbness in his arms. He did state he was having some chest pains as well off and on.   I let him know that his loop was implanted October of 2019, most likely the battery may be dead. Guidel from [employer] checked the device and confirmed that it was not working anymore.   Mandy checked the patient blood pressure:140/80 and his Heart rate: 80.  We advised him to go to the ER. I told him I will send Dr. Ladona Ridgel a phone note to let him know.   The patient and his mother verbalized understanding.

## 2022-12-16 ENCOUNTER — Ambulatory Visit: Payer: Medicare Other | Admitting: Family Medicine

## 2022-12-16 ENCOUNTER — Other Ambulatory Visit: Payer: Self-pay | Admitting: Nurse Practitioner

## 2022-12-16 DIAGNOSIS — Z760 Encounter for issue of repeat prescription: Secondary | ICD-10-CM

## 2022-12-16 DIAGNOSIS — I471 Supraventricular tachycardia, unspecified: Secondary | ICD-10-CM

## 2022-12-16 NOTE — Progress Notes (Deleted)
Patient name: Manuel Harrison Medical record number: [id number] Date of Birth: [birth year] Age: 52 y.o. Gender: male  HPI:  Manuel Harrison is a very pleasant 52 y.o. male who presents today to establish care.  PMHx: Past Medical History:  Diagnosis Date   Acute ischemic right MCA stroke (HCC) 04/25/2018   "left side weaker now" (04/26/2018)   Arthritis    "left knee" (04/26/2018)   Facial fracture (HCC) 01/2019   Gout 03/2019   High cholesterol    History of gout    Hypertension    Stroke (HCC) 09/2012   "right side is weaker since" (10//08/2017)   Tobacco use 03/2019   Vitamin D deficiency 09/2019    Surgical Hx: Past Surgical History:  Procedure Laterality Date   IR CT HEAD LTD  04/25/2018   IR INTRA CRAN STENT  04/25/2018   IR PERCUTANEOUS ART THROMBECTOMY/INFUSION INTRACRANIAL INC DIAG ANGIO  04/25/2018   LOOP RECORDER INSERTION N/A 04/28/2018   Procedure: LOOP RECORDER INSERTION;  Surgeon: Marinus Maw, MD;  [facility]: [facility];  Service: Cardiovascular;  Laterality: N/A;   RADIOLOGY WITH ANESTHESIA N/A 04/25/2018   Procedure: RADIOLOGY WITH ANESTHESIA;  Surgeon: Julieanne Cotton, MD;  Location: [facility] OR;  Service: Radiology;  Laterality: N/A;   TEE WITHOUT CARDIOVERSION N/A 04/28/2018   Procedure: TRANSESOPHAGEAL ECHOCARDIOGRAM (TEE);  Surgeon: Jodelle Red, MD;  Location: [facility] ENDOSCOPY;  Service: Cardiovascular;  Laterality: N/A;    Family Hx: Family History  Problem Relation Age of Onset   Other Mother        no health concerns   Other Father        no health concerns    Social Hx: Current Social History   Who lives at home: *** 12/16/2022  Who would speak for you about health care matters: *** 12/16/2022  Transportation: *** 12/16/2022 Important Relationships & Pets: *** 12/16/2022  Current Stressors: *** 12/16/2022 Work / Education:  *** 12/16/2022 Religious / Personal Beliefs: *** 12/16/2022 Interests / Fun: ***  12/16/2022  Medications:  Preventative Screening Colonoscopy: year*** results *** Mammogram: year*** results *** Pap test: year*** results *** PSA: year*** results *** DEXA: year*** results *** Tetanus vaccine: year*** results *** Pneumonia vaccine: year*** results *** Shingles vaccine: year*** results *** Heart stress test: year*** results *** Echocardiogram: year*** results *** Xrays: year*** results *** CT/MRI: year*** results ***  Smoking status reviewed  ROS: pertinent noted in the HPI   Objective  There were no vitals taken for this visit.  Physical Exam  Assessment and Plan  There are no diagnoses linked to this encounter. No follow-ups on file.  Celine Mans, MD, PGY-1 [facility] 6:42 AM 12/16/2022

## 2024-05-24 ENCOUNTER — Observation Stay (HOSPITAL_COMMUNITY)
Admission: EM | Admit: 2024-05-24 | Discharge: 2024-05-25 | Disposition: A | Discharge status: Home / Self Care | Attending: Internal Medicine | Admitting: Internal Medicine

## 2024-05-24 ENCOUNTER — Emergency Department (HOSPITAL_COMMUNITY)

## 2024-05-24 ENCOUNTER — Observation Stay (HOSPITAL_COMMUNITY)

## 2024-05-24 ENCOUNTER — Encounter (HOSPITAL_COMMUNITY): Payer: Self-pay | Admitting: Emergency Medicine

## 2024-05-24 ENCOUNTER — Other Ambulatory Visit: Payer: Self-pay

## 2024-05-24 DIAGNOSIS — Z720 Tobacco use: Secondary | ICD-10-CM | POA: Diagnosis not present

## 2024-05-24 DIAGNOSIS — Z79899 Other long term (current) drug therapy: Secondary | ICD-10-CM | POA: Insufficient documentation

## 2024-05-24 DIAGNOSIS — M25561 Pain in right knee: Secondary | ICD-10-CM | POA: Diagnosis not present

## 2024-05-24 DIAGNOSIS — F17210 Nicotine dependence, cigarettes, uncomplicated: Secondary | ICD-10-CM | POA: Insufficient documentation

## 2024-05-24 DIAGNOSIS — G8929 Other chronic pain: Secondary | ICD-10-CM

## 2024-05-24 DIAGNOSIS — F1090 Alcohol use, unspecified, uncomplicated: Secondary | ICD-10-CM | POA: Insufficient documentation

## 2024-05-24 DIAGNOSIS — E785 Hyperlipidemia, unspecified: Secondary | ICD-10-CM | POA: Insufficient documentation

## 2024-05-24 DIAGNOSIS — I10 Essential (primary) hypertension: Secondary | ICD-10-CM | POA: Diagnosis not present

## 2024-05-24 DIAGNOSIS — F1290 Cannabis use, unspecified, uncomplicated: Secondary | ICD-10-CM | POA: Insufficient documentation

## 2024-05-24 DIAGNOSIS — Z8673 Personal history of transient ischemic attack (TIA), and cerebral infarction without residual deficits: Secondary | ICD-10-CM | POA: Insufficient documentation

## 2024-05-24 DIAGNOSIS — J441 Chronic obstructive pulmonary disease with (acute) exacerbation: Secondary | ICD-10-CM | POA: Diagnosis not present

## 2024-05-24 LAB — BASIC METABOLIC PANEL WITH GFR
Anion gap: 14 (ref 5–15)
BUN: 5 mg/dL — ABNORMAL LOW (ref 6–20)
CO2: 20 mmol/L — ABNORMAL LOW (ref 22–32)
Calcium: 9.1 mg/dL (ref 8.9–10.3)
Chloride: 106 mmol/L (ref 98–111)
Creatinine, Ser: 1.18 mg/dL (ref 0.61–1.24)
GFR, Estimated: >60 mL/min (ref >60)
Glucose, Bld: 110 mg/dL — ABNORMAL HIGH (ref 70–99)
Potassium: 3.7 mmol/L (ref 3.5–5.1)
Sodium: 140 mmol/L (ref 135–145)

## 2024-05-24 LAB — TROPONIN I (HIGH SENSITIVITY): Troponin I (High Sensitivity): 8 ng/L (ref <18)

## 2024-05-24 LAB — RESP PANEL BY RT-PCR (RSV, FLU A&B, COVID)  RVPGX2
Influenza A by PCR: NEGATIVE
Influenza B by PCR: NEGATIVE
Resp Syncytial Virus by PCR: NEGATIVE
SARS Coronavirus 2 by RT PCR: NEGATIVE

## 2024-05-24 LAB — CBC
HCT: 46.6 % (ref 39.0–52.0)
Hemoglobin: 16.1 g/dL (ref 13.0–17.0)
MCH: 31.4 pg (ref 26.0–34.0)
MCHC: 34.5 g/dL (ref 30.0–36.0)
MCV: 90.8 fL (ref 80.0–100.0)
Platelets: 274 K/uL (ref 150–400)
RBC: 5.13 MIL/uL (ref 4.22–5.81)
RDW: 13.9 % (ref 11.5–15.5)
WBC: 5.7 K/uL (ref 4.0–10.5)
nRBC: 0 % (ref 0.0–0.2)

## 2024-05-24 LAB — BRAIN NATRIURETIC PEPTIDE: B Natriuretic Peptide: 6.6 pg/mL (ref 0.0–100.0)

## 2024-05-24 MED ORDER — AZITHROMYCIN 500 MG PO TABS
500.0000 mg | ORAL_TABLET | ORAL | Status: DC
  Administered 2024-05-24: 500 mg via ORAL
  Filled 2024-05-24: qty 2

## 2024-05-24 MED ORDER — METHYLPREDNISOLONE SODIUM SUCC 125 MG IJ SOLR
125.0000 mg | Freq: Once | INTRAMUSCULAR | Status: AC
  Administered 2024-05-24: 125 mg via INTRAVENOUS
  Filled 2024-05-24: qty 2

## 2024-05-24 MED ORDER — NICOTINE 21 MG/24HR TD PT24
21.0000 mg | MEDICATED_PATCH | Freq: Every day | TRANSDERMAL | Status: DC
  Administered 2024-05-24: 21 mg via TRANSDERMAL
  Filled 2024-05-24: qty 1

## 2024-05-24 MED ORDER — GUAIFENESIN-DM 100-10 MG/5ML PO SYRP: 10.0000 mL | ORAL_SOLUTION | ORAL | Status: DC | PRN

## 2024-05-24 MED ORDER — PNEUMOCOCCAL 20-VAL CONJ VACC 0.5 ML IM SUSY
0.5000 mL | PREFILLED_SYRINGE | INTRAMUSCULAR | Status: DC
  Filled 2024-05-24: qty 0.5

## 2024-05-24 MED ORDER — MORPHINE SULFATE (PF) 2 MG/ML IV SOLN
1.0000 mg | INTRAVENOUS | Status: DC | PRN
  Administered 2024-05-24: 2 mg via INTRAVENOUS
  Filled 2024-05-24: qty 1

## 2024-05-24 MED ORDER — ACETAMINOPHEN 325 MG PO TABS: 650.0000 mg | ORAL_TABLET | ORAL | Status: DC | PRN

## 2024-05-24 MED ORDER — AMLODIPINE BESYLATE 5 MG PO TABS
5.0000 mg | ORAL_TABLET | Freq: Every day | ORAL | Status: DC
  Administered 2024-05-24: 5 mg via ORAL
  Filled 2024-05-24: qty 1

## 2024-05-24 MED ORDER — OXYCODONE HCL 5 MG PO TABS: 5.0000 mg | ORAL_TABLET | Freq: Four times a day (QID) | ORAL | Status: DC | PRN

## 2024-05-24 MED ORDER — BENZONATATE 100 MG PO CAPS
200.0000 mg | ORAL_CAPSULE | Freq: Three times a day (TID) | ORAL | Status: DC | PRN
Start: 2024-05-24 — End: 2024-05-25
  Administered 2024-05-24: 200 mg via ORAL
  Filled 2024-05-24: qty 2

## 2024-05-24 MED ORDER — ENOXAPARIN SODIUM 40 MG/0.4ML IJ SOSY
40.0000 mg | PREFILLED_SYRINGE | INTRAMUSCULAR | Status: DC
  Administered 2024-05-24: 40 mg via SUBCUTANEOUS
  Filled 2024-05-24: qty 0.4

## 2024-05-24 MED ORDER — METHYLPREDNISOLONE SODIUM SUCC 125 MG IJ SOLR: 60.0000 mg | Freq: Every day | INTRAMUSCULAR | Status: DC

## 2024-05-24 MED ORDER — ALBUTEROL SULFATE (2.5 MG/3ML) 0.083% IN NEBU
10.0000 mg | INHALATION_SOLUTION | Freq: Once | RESPIRATORY_TRACT | Status: AC
  Administered 2024-05-24: 10 mg via RESPIRATORY_TRACT
  Filled 2024-05-24: qty 12

## 2024-05-24 MED ORDER — INFLUENZA VIRUS VACC SPLIT PF (FLUZONE) 0.5 ML IM SUSY: 0.5000 mL | PREFILLED_SYRINGE | INTRAMUSCULAR | Status: DC

## 2024-05-24 MED ORDER — IPRATROPIUM-ALBUTEROL 0.5-2.5 (3) MG/3ML IN SOLN: 3.0000 mL | RESPIRATORY_TRACT | Status: DC | PRN

## 2024-05-24 NOTE — H&P (Signed)
 History and Physical    Patient: Manuel Manuel Harrison FMW:[id number] DOB: Feb 10, 1971 DOA: 05/24/2024 DOS: the patient was seen and examined on 05/24/2024 PCP: [facility] [address]  Patient coming from: Home  Chief Complaint:  Chief Complaint  Patient presents with   Respiratory Distress   HPI: RUHAAN Harrison is a 53 y.o. male with medical history significant of tobacco abuse, hypertension, hyperlipidemia, right MCA infarct in 2019, gout, left knee arthritis, presents to ED with sob for a week, associated with chills, cough, and congestion. He denies any chest pain, nausea, vomiting or abdominal pain. He denies any hematochezia, hemoptysis. He reports smoking 3 packs of cigarettes every day for the last 30 plus years. He also complains of right knee pain worsened over the last 1 month.   ED work up On arrival to ED, he was afebrile, tachycardic 114/min, tachypnea of 37/min and BP of 168/93 mmhg.  Labs are remarkable for bicarb of 20 .  Influenza PCR, covid 19 and RSV negative.  CXR is negative for acute intra cranial abnormalities.  EKG shows sinus rhythm with atrial enlargement.  BNP and troponin are negative.    As patient continues to be sob , tachypneic, he was referred to [facility] for admission for acute copd exacerbation.  Review of Systems: As mentioned in the history of present illness. All other systems reviewed and are negative. Past Medical History:  Diagnosis Date   Acute ischemic right MCA stroke (HCC) 04/25/2018   left side weaker now (04/26/2018)   Arthritis    left knee (04/26/2018)   Facial fracture (HCC) 01/2019   Gout 03/2019   High cholesterol    History of gout    Hypertension    Stroke (HCC) 09/2012   right side is weaker since (10//08/2017)   Tobacco use 03/2019   Vitamin D  deficiency 09/2019   Past Surgical History:  Procedure Laterality Date   IR CT HEAD LTD  04/25/2018   IR INTRA CRAN STENT  04/25/2018   IR PERCUTANEOUS ART THROMBECTOMY/INFUSION  INTRACRANIAL INC DIAG ANGIO  04/25/2018   LOOP RECORDER INSERTION N/A 04/28/2018   Procedure: LOOP RECORDER INSERTION;  Surgeon: Waddell Danelle ORN, MD;  [facility];  Service: Cardiovascular;  Laterality: N/A;   RADIOLOGY WITH ANESTHESIA N/A 04/25/2018   Procedure: RADIOLOGY WITH ANESTHESIA;  Surgeon: Dolphus Carrion, MD;  Location: [facility] OR;  Service: Radiology;  Laterality: N/A;   TEE WITHOUT CARDIOVERSION N/A 04/28/2018   Procedure: TRANSESOPHAGEAL ECHOCARDIOGRAM (TEE);  Surgeon: Lonni Slain, MD;  Location: [facility] ENDOSCOPY;  Service: Cardiovascular;  Laterality: N/A;   Social History:  reports that he has been smoking cigarettes. He has a 64 pack-year smoking history. He has never used smokeless tobacco. He reports current alcohol use of about 47.0 standard drinks of alcohol per week. He reports current drug use. Drug: Marijuana.  Allergies  Allergen Reactions   Aleve  [Naproxen  Sodium] Nausea And Vomiting    Family History  Problem Relation Age of Onset   Other Mother        no health concerns   Other Father        no health concerns    Prior to Admission medications   Medication Sig Start Date End Date Taking? Authorizing Provider  allopurinol  (ZYLOPRIM ) 100 MG tablet TAKE 1 TABLET (100 MG TOTAL) BY MOUTH DAILY. 02/24/21 02/24/22  Myrna Camelia HERO, NP  amLODipine  (NORVASC ) 5 MG tablet TAKE 1 TABLET (5 MG TOTAL) BY MOUTH DAILY. 02/24/21 02/24/22  Myrna Camelia HERO,  NP  Colchicine  0.6 MG CAPS TAKE 1 CAPSULE 3 TIMES A DAY ON THE FISRT DAY OF FLARE UP. THEREAFTER, TAKE 1 CAPSULE 2 TIMES A DAY UNTIL GOUT FLARE UP RESOLVES. 02/24/21 02/24/22  Myrna Camelia HERO, NP  metoprolol  succinate (TOPROL -XL) 25 MG 24 hr tablet TAKE 1 TABLET (25 MG TOTAL) BY MOUTH DAILY. TAKE WITH OR IMMEDIATELY FOLLOWING A MEAL. 02/24/21 02/24/22  Myrna Camelia HERO, NP  rosuvastatin  (CRESTOR ) 20 MG tablet TAKE 1 TABLET (20 MG TOTAL) BY MOUTH DAILY AT 6 PM. 02/24/21 02/24/22  Myrna Camelia HERO, NP  Vitamin D , Ergocalciferol ,  (DRISDOL ) 1.25 MG (50000 UNIT) CAPS capsule Take 1 capsule (50,000 Units total) by mouth every 7 (seven) days. 02/24/21   Myrna Camelia HERO, NP    Physical Exam: Vitals:   05/24/24 1245 05/24/24 1300 05/24/24 1315 05/24/24 1702  BP: (!) 153/102 (!) 149/80 (!) 159/97   Pulse: (!) 101  97   Resp:  (!) 37 (!) 26   Temp: 97.8 F (36.6 C)   97.6 F (36.4 C)  TempSrc: Temporal     SpO2: 100%  100%   General exam: Appears calm and comfortable  Respiratory system: tachypnea, diminished air entry throughout, scattered wheezing,  Cardiovascular system: S1 & S2 heard, RRR. No JVD, murmurs, rubs, gallops or clicks. No pedal edema. Gastrointestinal system: Abdomen is nondistended, soft and nontender. No organomegaly or masses felt. Normal bowel sounds heard. Central nervous system: Alert and oriented. No focal neurological deficits. Extremities: Symmetric 5 x 5 power. Skin: No rashes, lesions or ulcers Psychiatry: Judgement and insight appear normal. Mood & affect appropriate.   Data Reviewed: Results for orders placed or performed during the hospital encounter of 05/24/24 (from the past 24 hours)  Basic metabolic panel     Status: Abnormal   Collection Time: 05/24/24 12:59 PM  Result Value Ref Range   Sodium 140 135 - 145 mmol/L   Potassium 3.7 3.5 - 5.1 mmol/L   Chloride 106 98 - 111 mmol/L   CO2 20 (L) 22 - 32 mmol/L   Glucose, Bld 110 (H) 70 - 99 mg/dL   BUN 5 (L) 6 - 20 mg/dL   Creatinine, Ser 8.81 0.61 - 1.24 mg/dL   Calcium  9.1 8.9 - 10.3 mg/dL   GFR, Estimated >39 >39 mL/min   Anion gap 14 5 - 15  CBC     Status: None   Collection Time: 05/24/24 12:59 PM  Result Value Ref Range   WBC 5.7 4.0 - 10.5 K/uL   RBC 5.13 4.22 - 5.81 MIL/uL   Hemoglobin 16.1 13.0 - 17.0 g/dL   HCT 53.3 60.9 - 47.9 %   MCV 90.8 80.0 - 100.0 fL   MCH 31.4 26.0 - 34.0 pg   MCHC 34.5 30.0 - 36.0 g/dL   RDW 86.0 88.4 - 84.4 %   Platelets 274 150 - 400 K/uL   nRBC 0.0 0.0 - 0.2 %  Brain natriuretic  peptide     Status: None   Collection Time: 05/24/24 12:59 PM  Result Value Ref Range   B Natriuretic Peptide 6.6 0.0 - 100.0 pg/mL  Troponin I (High Sensitivity)     Status: None   Collection Time: 05/24/24 12:59 PM  Result Value Ref Range   Troponin I (High Sensitivity) 8 <18 ng/L  Resp panel by RT-PCR (RSV, Flu A&B, Covid) Anterior Nasal Swab     Status: None   Collection Time: 05/24/24  1:11 PM   Specimen: Anterior Nasal Swab  Result Value Ref Range   SARS Coronavirus 2 by RT PCR NEGATIVE NEGATIVE   Influenza A by PCR NEGATIVE NEGATIVE   Influenza B by PCR NEGATIVE NEGATIVE   Resp Syncytial Virus by PCR NEGATIVE NEGATIVE     Assessment and Plan: Acute copd exacerbation:  Started the patient on IV solumedrol 60 mg Daily, recommend a quick prednisone  taper if improved.  Started him on duonebs, robitussin and azithromycin  500 mg daily for 3 days.  Ambulate in am and check oxygen levels.     Hypertension: Pharmacy to complete the AVS, currently patient is on norvasc  and metoprolol  from 2023.  Will start the patient on low dose amlodipine .    Hyperlipidemia Home meds show that he was on crestor  in 2022.  Unclear if he is still taking it.    H/o MCA Stroke  Tobacco abuse:  Cessation counseling given.  Started the patient on nicotine  patch.    Right knee pain X rays of the knee ordered and pain control.  Therapy eval in am.    Advance Care Planning:   Code Status: Full Code   Consults: none.   Family Communication: none at bedside.   Severity of Illness: The appropriate patient status for this patient is OBSERVATION. Observation status is judged to be reasonable and necessary in order to provide the required intensity of service to ensure the patient's safety. The patient's presenting symptoms, physical exam findings, and initial radiographic and laboratory data in the context of their medical condition is felt to place them at decreased risk for further clinical  deterioration. Furthermore, it is anticipated that the patient will be medically stable for discharge from the hospital within 2 midnights of admission.   Author: Elgie Butter, MD 05/24/2024 5:16 PM  For on call review [email or web address].

## 2024-05-24 NOTE — ED Notes (Signed)
 Floor notified patient coming up

## 2024-05-24 NOTE — ED Notes (Signed)
 Patient transported to CT

## 2024-05-24 NOTE — ED Provider Notes (Signed)
 [place] EMERGENCY DEPARTMENT AT [facility] Provider Note   CSN: [id number] Arrival date & time: 05/24/24  1238     Patient presents with: Respiratory Distress   Manuel Harrison is a 53 y.o. male.   53 yo M with a chief complaints of shortness of breath.  Going on for the past week.  Fevers cough.  Has a history of COPD thinks it feels the same.  Tried his inhaler at home today without significant improvement.  Received 10 mg albuterol  milligram Atrovent and Solu-Medrol and magnesium and route but with minimal improvement.        Prior to Admission medications   Medication Sig Start Date End Date Taking? Authorizing Provider  allopurinol  (ZYLOPRIM ) 100 MG tablet TAKE 1 TABLET (100 MG TOTAL) BY MOUTH DAILY. 02/24/21 02/24/22  Myrna Camelia HERO, NP  amLODipine  (NORVASC ) 5 MG tablet TAKE 1 TABLET (5 MG TOTAL) BY MOUTH DAILY. 02/24/21 02/24/22  Myrna Camelia HERO, NP  Colchicine  0.6 MG CAPS TAKE 1 CAPSULE 3 TIMES A DAY ON THE FISRT DAY OF FLARE UP. THEREAFTER, TAKE 1 CAPSULE 2 TIMES A DAY UNTIL GOUT FLARE UP RESOLVES. 02/24/21 02/24/22  Myrna Camelia HERO, NP  metoprolol  succinate (TOPROL -XL) 25 MG 24 hr tablet TAKE 1 TABLET (25 MG TOTAL) BY MOUTH DAILY. TAKE WITH OR IMMEDIATELY FOLLOWING A MEAL. 02/24/21 02/24/22  Myrna Camelia HERO, NP  rosuvastatin  (CRESTOR ) 20 MG tablet TAKE 1 TABLET (20 MG TOTAL) BY MOUTH DAILY AT 6 PM. 02/24/21 02/24/22  Myrna Camelia HERO, NP  Vitamin D , Ergocalciferol , (DRISDOL ) 1.25 MG (50000 UNIT) CAPS capsule Take 1 capsule (50,000 Units total) by mouth every 7 (seven) days. 02/24/21   Myrna Camelia HERO, NP    Allergies: Aleve  [naproxen  sodium]    Review of Systems  Updated Vital Signs BP (!) 159/97   Pulse 97   Temp 97.8 F (36.6 C) (Temporal)   Resp (!) 26   SpO2 100%   Physical Exam Vitals and nursing note reviewed.  Constitutional:      Appearance: He is well-developed.  HENT:     Head: Normocephalic and atraumatic.  Eyes:     Pupils: Pupils are equal, round,  and reactive to light.  Neck:     Vascular: No JVD.  Cardiovascular:     Rate and Rhythm: Normal rate and regular rhythm.     Heart sounds: No murmur heard.    No friction rub. No gallop.  Pulmonary:     Effort: No respiratory distress.     Breath sounds: Wheezing present.     Comments: Diminished breath sounds in all lung fields with tachypnea and wheezes. Abdominal:     General: There is no distension.     Tenderness: There is no abdominal tenderness. There is no guarding or rebound.  Musculoskeletal:        General: Normal range of motion.     Cervical back: Normal range of motion and neck supple.  Skin:    Coloration: Skin is not pale.     Findings: No rash.  Neurological:     Mental Status: He is alert and oriented to person, place, and time.  Psychiatric:        Behavior: Behavior normal.     (all labs ordered are listed, but only abnormal results are displayed) Labs Reviewed  BASIC METABOLIC PANEL WITH GFR - Abnormal; Notable for the following components:      Result Value   CO2 20 (*)    Glucose,  Bld 110 (*)    BUN 5 (*)    All other components within normal limits  RESP PANEL BY RT-PCR (RSV, FLU A&B, COVID)  RVPGX2  CBC  BRAIN NATRIURETIC PEPTIDE  TROPONIN I (HIGH SENSITIVITY)    EKG: EKG Interpretation Date/Time:  Thursday May 24 2024 12:47:33 EDT Ventricular Rate:  114 PR Interval:  142 QRS Duration:  65 QT Interval:  320 QTC Calculation: 441 R Axis:   84  Text Interpretation: Sinus tachycardia Right atrial enlargement Anteroseptal infarct, old Since last tracing rate faster Otherwise no significant change Confirmed by Emil Share [phone]) on 05/24/2024 12:50:36 PM  Radiology: DG Chest Portable 1 View Result Date: 05/24/2024 EXAM: 1 VIEW(S) XRAY OF THE CHEST 05/24/2024 01:30:00 PM COMPARISON: 04/26/2018 CLINICAL HISTORY: sob FINDINGS: LUNGS AND PLEURA: No focal pulmonary opacity. No pulmonary edema. No pleural effusion. No pneumothorax. HEART AND  MEDIASTINUM: No acute abnormality of the cardiac and mediastinal silhouettes. BONES AND SOFT TISSUES: No acute osseous abnormality. IMPRESSION: 1. No acute cardiopulmonary process. Electronically signed by: Lynwood Seip MD 05/24/2024 01:48 PM EDT RP Workstation: [id number]     Procedures   Medications Ordered in the ED  albuterol  (PROVENTIL ) (2.5 MG/3ML) 0.083% nebulizer solution 10 mg (10 mg Nebulization Given 05/24/24 1527)                                    Medical Decision Making Amount and/or Complexity of Data Reviewed Labs: ordered. Radiology: ordered.  Risk Prescription drug management.   53 yo M with a chief complaints of difficulty breathing.  Patient despite receiving quite a bit of therapy with EMS still very tight on exam.  Given another continuous albuterol  treatment with some improvement.  Still tachypnea still wheezes.  Will admit.  Chest x-ray without obvious focal infiltrates or pneumothorax.  No acute anemia no significant electrolyte abnormalities.  BNP and troponin are negative.  The patients results and plan were reviewed and discussed.   Any x-rays performed were independently reviewed by myself.   Differential diagnosis were considered with the presenting HPI.  Medications  albuterol  (PROVENTIL ) (2.5 MG/3ML) 0.083% nebulizer solution 10 mg (10 mg Nebulization Given 05/24/24 1527)    Vitals:   05/24/24 1245 05/24/24 1300 05/24/24 1315  BP: (!) 153/102 (!) 149/80 (!) 159/97  Pulse: (!) 101  97  Resp:  (!) 37 (!) 26  Temp: 97.8 F (36.6 C)    TempSrc: Temporal    SpO2: 100%  100%    Final diagnoses:  COPD exacerbation (HCC)    Admission/ observation were discussed with the admitting physician, patient and/or family and they are comfortable with the plan.       Final diagnoses:  COPD exacerbation [facility])    ED Discharge Orders     None          Emil Share, DO 05/24/24 1535

## 2024-05-24 NOTE — ED Triage Notes (Addendum)
 Pt BIB [facility] from home for sob x 1 week with COPD hx. Nop 02 at baseline.  Endorses mold in the house.  He used  his albuterol  inhaler 20 times today with only temporary relief.   R side wheeze, L side clearer per EMS.    10 albuterol  1mg  Atrovent Solumedrol 125mg  2G mag  20G L.AC  Hr 120 146/100, 93% RA 99% on neb  Wheezes BL on my auscultation

## 2024-05-25 LAB — RESPIRATORY PANEL BY PCR

## 2024-05-25 LAB — HIV ANTIBODY (ROUTINE TESTING W REFLEX): HIV Screen 4th Generation wRfx: NONREACTIVE

## 2024-05-25 MED ORDER — NICOTINE POLACRILEX 2 MG MT GUM
2.0000 mg | CHEWING_GUM | OROMUCOSAL | Status: DC | PRN
  Filled 2024-05-25 (×3): qty 1

## 2024-05-25 NOTE — Progress Notes (Signed)
 During rounding on patients did not find patient in his room. Security called to let them know. Physician also notified.

## 2024-05-25 NOTE — Progress Notes (Signed)
 [facility] night cross cover note:   I was notified by the patient's RN  that this patient has left AMA.     Eva Pore, DO Hospitalist

## 2024-05-25 NOTE — Progress Notes (Signed)
 Call [name] to report patient had left the unit after being told he could not go down to smoke.  Wife did call back and reported patient had left the hospital and gone to work as he felt better and did not want to miss work.  Notified AC of this also and notified Physician.

## 2024-05-25 NOTE — Progress Notes (Signed)
 This RN contacted spouse to let her know that patient had left the floor and to ask if he had gone home. She stated I will call his phone and call you right back  Security was also notified earlier. Patient still has not come back to his room at this time.

## 2024-05-25 NOTE — Discharge Summary (Signed)
 Physician Discharge Summary   Patient: Manuel Harrison MRN: [id number] DOB: 03/20/1971  Admit date:     05/24/2024  Discharge date: 05/25/24  Discharge Physician: Elgie Butter   PCP: [facility]   Recommendations at discharge:  Patient left AMA.  Discharge Diagnoses: Principal Problem:   COPD with acute exacerbation (HCC)  Resolved Problems:   * No resolved hospital problems. [facility] Course:/ Assessment and Plan:   Manuel Harrison is a 53 y.o. male with medical history significant of tobacco abuse, hypertension, hyperlipidemia, right MCA infarct in 2019, gout, left knee arthritis, presents to ED with sob for a week, associated with chills, cough, and congestion. He was admitted for acute copd exacerbation and was started on steroids, duonebs.   Apparently patient was not in the room during rounds by RN. Security was called and patient's wife was called, who told the staff that patient left as he was feeling better.      Consultants: none.   DISCHARGE MEDICATION:       The results of significant diagnostics from this hospitalization (including imaging, microbiology, ancillary and laboratory) are listed below for reference.   Imaging Studies: DG Knee Complete 4 Views Right Result Date: 05/24/2024 EXAM: 4 OR MORE VIEW(S) XRAY OF THE RIGHT KNEE 05/24/2024 05:32:00 PM COMPARISON: None available. CLINICAL HISTORY: Knee pain. FINDINGS: BONES AND JOINTS: No acute fracture. No focal osseous lesion. No joint dislocation. There is a small joint effusion. There is mild medial compartment joint space narrowing. SOFT TISSUES: The soft tissues are unremarkable. IMPRESSION: 1. Small joint effusion. 2. Mild medial compartment joint space narrowing. Electronically signed by: Greig Pique MD 05/24/2024 05:42 PM EDT RP Workstation: [id number]   DG Chest Portable 1 View Result Date: 05/24/2024 EXAM: 1 VIEW(S) XRAY OF THE CHEST 05/24/2024 01:30:00 PM COMPARISON: 04/26/2018  CLINICAL HISTORY: sob FINDINGS: LUNGS AND PLEURA: No focal pulmonary opacity. No pulmonary edema. No pleural effusion. No pneumothorax. HEART AND MEDIASTINUM: No acute abnormality of the cardiac and mediastinal silhouettes. BONES AND SOFT TISSUES: No acute osseous abnormality. IMPRESSION: 1. No acute cardiopulmonary process. Electronically signed by: Lynwood Seip MD 05/24/2024 01:48 PM EDT RP Workstation: [id number]    Microbiology: Results for orders placed or performed during the hospital encounter of 05/24/24  Resp panel by RT-PCR (RSV, Flu A&B, Covid) Anterior Nasal Swab     Status: None   Collection Time: 05/24/24  1:11 PM   Specimen: Anterior Nasal Swab  Result Value Ref Range Status   SARS Coronavirus 2 by RT PCR NEGATIVE NEGATIVE Final   Influenza A by PCR NEGATIVE NEGATIVE Final   Influenza B by PCR NEGATIVE NEGATIVE Final    Comment: (NOTE) The Xpert Xpress SARS-CoV-2/FLU/RSV plus assay is intended as an aid in the diagnosis of influenza from Nasopharyngeal swab specimens and should not be used as a sole basis for treatment. Nasal washings and aspirates are unacceptable for Xpert Xpress SARS-CoV-2/FLU/RSV testing.  Fact Sheet for Patients: bloggercourse.com  Fact Sheet for Healthcare Providers: seriousbroker.it  This test is not yet approved or cleared by the [place]  FDA and has been authorized for detection and/or diagnosis of SARS-CoV-2 by FDA under an Emergency Use Authorization (EUA). This EUA will remain in effect (meaning this test can be used) for the duration of the COVID-19 declaration under Section 564(b)(1) of the Act, 21 U.S.C. section 360bbb-3(b)(1), unless the authorization is terminated or revoked.     Resp Syncytial Virus by PCR NEGATIVE NEGATIVE Final    Comment: (NOTE)  Fact Sheet for Patients: bloggercourse.com  Fact Sheet for Healthcare  Providers: seriousbroker.it  This test is not yet approved or cleared by the [place]  FDA and has been authorized for detection and/or diagnosis of SARS-CoV-2 by FDA under an Emergency Use Authorization (EUA). This EUA will remain in effect (meaning this test can be used) for the duration of the COVID-19 declaration under Section 564(b)(1) of the Act, 21 U.S.C. section 360bbb-3(b)(1), unless the authorization is terminated or revoked.  Performed at [facility], [address], [place], [place] [address]     Labs: CBC: Recent Labs  Lab 05/24/24 1259  WBC 5.7  HGB 16.1  HCT 46.6  MCV 90.8  PLT 274   Basic Metabolic Panel: Recent Labs  Lab 05/24/24 1259  NA 140  K 3.7  CL 106  CO2 20*  GLUCOSE 110*  BUN 5*  CREATININE 1.18  CALCIUM  9.1   Liver Function Tests: No results for input(s): AST, ALT, ALKPHOS, BILITOT, PROT, ALBUMIN in the last 168 hours. CBG: No results for input(s): GLUCAP in the last 168 hours.  Discharge time spent: 20 minutes.   Signed: Elgie Butter, MD [facility] 05/25/2024
